# Patient Record
Sex: Female | Born: 1990 | Race: White | Hispanic: No | Marital: Married | Smoking: Former smoker
Health system: Southern US, Community
[De-identification: ages and names within clinical notes are randomized; demographics above are authoritative.]

## PROBLEM LIST (undated history)

## (undated) DIAGNOSIS — D649 Anemia, unspecified: Secondary | ICD-10-CM

## (undated) DIAGNOSIS — I82409 Acute embolism and thrombosis of unspecified deep veins of unspecified lower extremity: Secondary | ICD-10-CM

## (undated) DIAGNOSIS — K649 Unspecified hemorrhoids: Secondary | ICD-10-CM

## (undated) DIAGNOSIS — F419 Anxiety disorder, unspecified: Secondary | ICD-10-CM

## (undated) DIAGNOSIS — K602 Anal fissure, unspecified: Secondary | ICD-10-CM

## (undated) DIAGNOSIS — T7840XA Allergy, unspecified, initial encounter: Secondary | ICD-10-CM

## (undated) DIAGNOSIS — K5190 Ulcerative colitis, unspecified, without complications: Secondary | ICD-10-CM

## (undated) HISTORY — DX: Acute embolism and thrombosis of unspecified deep veins of unspecified lower extremity: I82.409

## (undated) HISTORY — DX: Allergy, unspecified, initial encounter: T78.40XA

## (undated) HISTORY — DX: Unspecified hemorrhoids: K64.9

## (undated) HISTORY — DX: Anxiety disorder, unspecified: F41.9

## (undated) HISTORY — DX: Anal fissure, unspecified: K60.2

## (undated) HISTORY — DX: Anemia, unspecified: D64.9

## (undated) HISTORY — PX: WISDOM TOOTH EXTRACTION: SHX21

## (undated) HISTORY — PX: COLONOSCOPY: SHX174

---
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
## 2010-12-18 HISTORY — PX: COSMETIC SURGERY: SHX468

## 2013-04-02 ENCOUNTER — Ambulatory Visit (INDEPENDENT_AMBULATORY_CARE_PROVIDER_SITE_OTHER): Payer: BC Managed Care – PPO | Admitting: Family Medicine

## 2013-04-02 ENCOUNTER — Emergency Department (HOSPITAL_COMMUNITY)
Admission: EM | Admit: 2013-04-02 | Discharge: 2013-04-02 | Disposition: A | Discharge status: Home / Self Care | Payer: BC Managed Care – PPO | Attending: Emergency Medicine | Admitting: Emergency Medicine

## 2013-04-02 ENCOUNTER — Encounter (HOSPITAL_COMMUNITY): Payer: Self-pay | Admitting: Emergency Medicine

## 2013-04-02 ENCOUNTER — Emergency Department (HOSPITAL_COMMUNITY): Payer: BC Managed Care – PPO

## 2013-04-02 DIAGNOSIS — F411 Generalized anxiety disorder: Secondary | ICD-10-CM | POA: Insufficient documentation

## 2013-04-02 DIAGNOSIS — Z79899 Other long term (current) drug therapy: Secondary | ICD-10-CM | POA: Insufficient documentation

## 2013-04-02 DIAGNOSIS — Z3202 Encounter for pregnancy test, result negative: Secondary | ICD-10-CM | POA: Insufficient documentation

## 2013-04-02 DIAGNOSIS — F17200 Nicotine dependence, unspecified, uncomplicated: Secondary | ICD-10-CM | POA: Insufficient documentation

## 2013-04-02 DIAGNOSIS — S3981XA Other specified injuries of abdomen, initial encounter: Secondary | ICD-10-CM | POA: Insufficient documentation

## 2013-04-02 DIAGNOSIS — R109 Unspecified abdominal pain: Secondary | ICD-10-CM

## 2013-04-02 DIAGNOSIS — S0990XA Unspecified injury of head, initial encounter: Secondary | ICD-10-CM | POA: Insufficient documentation

## 2013-04-02 DIAGNOSIS — Y9389 Activity, other specified: Secondary | ICD-10-CM | POA: Insufficient documentation

## 2013-04-02 DIAGNOSIS — Y9289 Other specified places as the place of occurrence of the external cause: Secondary | ICD-10-CM | POA: Insufficient documentation

## 2013-04-02 LAB — URINALYSIS, DIPSTICK ONLY
Bilirubin Urine: NEGATIVE
Glucose, UA: NEGATIVE mg/dL
Hgb urine dipstick: NEGATIVE
Ketones, ur: NEGATIVE mg/dL
Nitrite: NEGATIVE
Protein, ur: NEGATIVE mg/dL
Specific Gravity, Urine: 1.011 (ref 1.005–1.030)
Urobilinogen, UA: 0.2 mg/dL (ref 0.0–1.0)
pH: 7.5 (ref 5.0–8.0)

## 2013-04-02 LAB — POCT PREGNANCY, URINE: Preg Test, Ur: NEGATIVE

## 2013-04-02 MED ORDER — ONDANSETRON HCL 4 MG/2ML IJ SOLN
4.0000 mg | Freq: Once | INTRAMUSCULAR | Status: AC
  Administered 2013-04-02: 4 mg via INTRAVENOUS
  Filled 2013-04-02: qty 2

## 2013-04-02 MED ORDER — SODIUM CHLORIDE 0.9 % IV BOLUS (SEPSIS): 1000.0000 mL | Freq: Once | INTRAVENOUS | Status: DC

## 2013-04-02 MED ORDER — TRAMADOL HCL 50 MG PO TABS: 50.0000 mg | ORAL_TABLET | Freq: Four times a day (QID) | ORAL | Status: DC | PRN

## 2013-04-02 MED ORDER — KETOROLAC TROMETHAMINE 30 MG/ML IJ SOLN
30.0000 mg | Freq: Once | INTRAMUSCULAR | Status: AC
  Administered 2013-04-02: 30 mg via INTRAVENOUS
  Filled 2013-04-02: qty 1
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
## 2013-04-02 NOTE — ED Notes (Signed)
Patient transported to CT 
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
## 2013-04-02 NOTE — ED Notes (Signed)
MD at bedside. 
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
## 2015-11-24 ENCOUNTER — Ambulatory Visit (INDEPENDENT_AMBULATORY_CARE_PROVIDER_SITE_OTHER): Payer: BC Managed Care – PPO | Admitting: Family Medicine

## 2015-11-24 VITALS — BP 132/72 | HR 98 | Temp 98.4°F | Resp 16 | Ht 63.0 in | Wt 160.4 lb

## 2015-11-24 DIAGNOSIS — N898 Other specified noninflammatory disorders of vagina: Secondary | ICD-10-CM | POA: Diagnosis not present

## 2015-11-24 NOTE — Progress Notes (Signed)
Chief Complaint:  Chief Complaint  Patient presents with  . Mass    Vaginal area, 1 week, no pain, no drainage    HPI: Gloria Green is a 24 y.o. female who reports to [facility] today complaining of 1 week hx of vaginal lesion STD testing x 3  All negative at her pb gyn, she s here because she is worried. No fevers ,chills, cahnges to the area, warmth, redness, pain, no dc.  They did everything even did viral cx for  HSV 2 and was neg She has been scrubbing it hard, has stopped shaving, has used Destin and also neomycin  Past Medical History  Diagnosis Date  . Anxiety    Past Surgical History  Procedure Laterality Date  . Cosmetic surgery  2012    nose   Social History   Social History  . Marital Status: Single    Spouse Name: N/A  . Number of Children: N/A  . Years of Education: N/A   Social History Main Topics  . Smoking status: Current Every Day Smoker  . Smokeless tobacco: None  . Alcohol Use: None  . Drug Use: None  . Sexual Activity: Not Asked   Other Topics Concern  . None   Social History Narrative   Family History  Problem Relation Age of Onset  . Hypertension Mother   . Hypertension Father    Allergies  Allergen Reactions  . Sulfa Antibiotics Hives   Prior to Admission medications   Medication Sig Start Date End Date Taking? Authorizing Provider  norethindrone-ethinyl estradiol (JUNEL FE,GILDESS FE,LOESTRIN FE) 1-20 MG-MCG tablet Take 1 tablet by mouth daily. Uses continuous use   Yes Historical Provider, MD  ARIPiprazole (ABILIFY) 5 MG tablet Take 2.5 mg by mouth every morning. 1/2 tab daily    Historical Provider, MD  escitalopram (LEXAPRO) 10 MG tablet Take 10 mg by mouth every morning.     Historical Provider, MD  lamoTRIgine (LAMICTAL) 200 MG tablet Take 200 mg by mouth every morning.     Historical Provider, MD  traMADol (ULTRAM) 50 MG tablet Take 1 tablet (50 mg total) by mouth every 6 (six) hours as needed for pain. Patient not taking:  Reported on 11/24/2015 04/02/13   Nat Christen, MD     ROS: The patient denies fevers, chills, night sweats, unintentional weight loss, chest pain, palpitations, wheezing, dyspnea on exertion, nausea, vomiting, abdominal pain, dysuria, hematuria, melena, numbness, weakness, or tingling.   All other systems have been reviewed and were otherwise negative with the exception of those mentioned in the HPI and as above.    PHYSICAL EXAM: Filed Vitals:   11/24/15 1854  BP: 132/72  Pulse: 98  Temp: 98.4 F (36.9 C)  Resp: 16   Body mass index is 28.42 kg/(m^2).   General: Alert, no acute distress HEENT:  Normocephalic, atraumatic, oropharynx patent. EOMI, PERRLA Cardiovascular:  Regular rate and rhythm, no rubs murmurs or gallops.   Respiratory: Clear to auscultation bilaterally.  No wheezes, rales, or rhonchi.  No cyanosis, no use of accessory musculature Abdominal: No organomegaly, abdomen is soft and non-tender, positive bowel sounds. No masses. Skin: No rashes. Neurologic: Facial musculature symmetric. Psychiatric: Patient acts appropriately throughout our interaction. Lymphatic: No cervical or submandibular lymphadenopathy Musculoskeletal: Gait intact. No edema, tenderness Small perivaginal abraision at 12 oclock, it is very small and superficial about less than 5 mm  No dc , no e/o infection   LABS: Results for orders placed or  performed during the hospital encounter of 04/02/13  Urinalysis, dipstick only  Result Value Ref Range   Specific Gravity, Urine 1.011 1.005 - 1.030   pH 7.5 5.0 - 8.0   Glucose, UA NEGATIVE NEGATIVE mg/dL   Hgb urine dipstick NEGATIVE NEGATIVE   Bilirubin Urine NEGATIVE NEGATIVE   Ketones, ur NEGATIVE NEGATIVE mg/dL   Protein, ur NEGATIVE NEGATIVE mg/dL   Urobilinogen, UA 0.2 0.0 - 1.0 mg/dL   Nitrite NEGATIVE NEGATIVE   Leukocytes, UA SMALL (A) NEGATIVE  Pregnancy, urine POC  Result Value Ref Range   Preg Test, Ur NEGATIVE NEGATIVE      EKG/XRAY:   Primary read interpreted by Dr. Marin Comment at [facility].   ASSESSMENT/PLAN: Encounter Diagnosis  Name Primary?  . Vaginal lesion Yes   This is jsut a small abrasion Avoid caustic products or scrubbing  Use destin prn or zinc ointment as barrier cream Fu prn   Gross sideeffects, risk and benefits, and alternatives of medications d/w patient. Patient is aware that all medications have potential sideeffects and we are unable to predict every sideeffect or drug-drug interaction that may occur.  Thao Le DO  11/24/2015 8:17 PM

## 2016-01-04 ENCOUNTER — Encounter: Payer: Self-pay | Admitting: Physician Assistant

## 2016-01-04 ENCOUNTER — Ambulatory Visit (INDEPENDENT_AMBULATORY_CARE_PROVIDER_SITE_OTHER): Payer: BC Managed Care – PPO | Admitting: Physician Assistant

## 2016-01-04 VITALS — BP 138/86 | HR 92 | Temp 99.2°F | Resp 16 | Ht 62.75 in | Wt 161.0 lb

## 2016-01-04 DIAGNOSIS — J029 Acute pharyngitis, unspecified: Secondary | ICD-10-CM

## 2016-01-04 DIAGNOSIS — J0391 Acute recurrent tonsillitis, unspecified: Secondary | ICD-10-CM

## 2016-01-04 DIAGNOSIS — L03012 Cellulitis of left finger: Secondary | ICD-10-CM

## 2016-01-04 LAB — POCT RAPID STREP A (OFFICE): RAPID STREP A SCREEN: NEGATIVE

## 2016-01-04 NOTE — Progress Notes (Signed)
[facility] [address] [place] [address] [phone]  Date:  01/04/2016   Name:  Gloria Green   DOB:  [birth year]   MRN:  [id number]  PCP:  No PCP Per Patient    Chief Complaint: Hand Pain and Sore Throat   History of Present Illness:  This is a 25 y.o. female who is presenting with left 4th finger pain x 2 days. States she had a hang nail and bit it off. Afterwards she started having pain and swelling of the finger. This morning she woke and looked like there was pus near the cuticle. She states this has happened before and required lancing.  She is also complaining of sore throat and nasal congestion that began this morning. She is running a low grade fever in the 99s. She was dx'd with tonsillitis 2 months ago. Strep negative at that time. She was prescribed amoxicillin. She took only part of the rx because she started to feel better. 1 month later sore throat recurred. She took the rest of the amoxicillin and again her sx resolved. Pt states she is prone to strep. She gets strep throat 4-5 times a year. She gets tonsillitis strep negative even more times during the year. She states she has been begging her PCP to refer her to ENT to have her tonsils removed but states her PCP tells her "that's an old school method". She denies cough, otalgia.  Review of Systems:  Review of Systems See HPI  There are no active problems to display for this patient.   Prior to Admission medications   Medication Sig Start Date End Date Taking? Authorizing Provider  norethindrone-ethinyl estradiol (JUNEL FE,GILDESS FE,LOESTRIN FE) 1-20 MG-MCG tablet Take 1 tablet by mouth daily. Uses continuous use   Yes Historical Provider, MD                         Allergies  Allergen Reactions  . Sulfa Antibiotics Hives    Past Surgical History  Procedure Laterality Date  . Cosmetic surgery  2012    nose    Social History  Substance Use Topics  . Smoking status: Current  Every Day Smoker  . Smokeless tobacco: None  . Alcohol Use: None    Family History  Problem Relation Age of Onset  . Hypertension Mother   . Hypertension Father     Medication list has been reviewed and updated.  Physical Examination:  Physical Exam  Constitutional: She is oriented to person, place, and time. She appears well-developed and well-nourished. No distress.  HENT:  Head: Normocephalic and atraumatic.  Right Ear: Hearing, tympanic membrane, external ear and ear canal normal.  Left Ear: Hearing, tympanic membrane, external ear and ear canal normal.  Nose: Nose normal.  Mouth/Throat: Uvula is midline and mucous membranes are normal. Posterior oropharyngeal erythema present. No oropharyngeal exudate or posterior oropharyngeal edema.  Eyes: Conjunctivae and lids are normal. Right eye exhibits no discharge. Left eye exhibits no discharge. No scleral icterus.  Cardiovascular: Normal rate, regular rhythm, normal heart sounds and normal pulses.   No murmur heard. Pulmonary/Chest: Effort normal and breath sounds normal. No respiratory distress. She has no wheezes. She has no rhonchi. She has no rales.  Musculoskeletal: Normal range of motion.  Lymphadenopathy:       Head (right side): No submental, no submandibular and no tonsillar adenopathy present.       Head (left side): No submental,  no submandibular and no tonsillar adenopathy present.    She has cervical adenopathy (shotty, bilateral).  Neurological: She is alert and oriented to person, place, and time.  Skin: Skin is warm, dry and intact.  Left fourth digit with mild erythema and purulence along nail bed. TTP.  Psychiatric: She has a normal mood and affect. Her speech is normal and behavior is normal. Thought content normal.   BP 138/86 mmHg  Pulse 92  Temp(Src) 99.2 F (37.3 C) (Oral)  Resp 16  Ht 5' 2.75" (1.594 m)  Wt 161 lb (73.029 kg)  BMI 28.74 kg/m2  SpO2 100%  Procedure: Verbal consent obtained. Skin  was cleaned with alcohol. Right sided digital block performed with 1 cc 1% lido without epi. A 0.25 cm incision was made. A moderate amount of purulent material expressed. Wound not packed. Wound dressed and wound care discussed.  Results for orders placed or performed in visit on 01/04/16  POCT rapid strep A  Result Value Ref Range   Rapid Strep A Screen Negative Negative   Assessment and Plan:  1. Paronychia, left Paronychia drained. Wound culture pending. No surrounding cellulitis so no abx prescribed. She will soak finger once a day for next 3 days. Counseled on wound care. Return as needed. - Wound culture  2. Sore throat 3. Recurrent tonsillitis  Rapid strep negative, culture pending. Will abstain from treating with abx since she is on so often for tonsillitis. Referred to ENT for evaluation. - POCT rapid strep A - Culture, Group A Strep - Ambulatory referral to ENT   Benjaman Pott. Drenda Freeze, MHS [facility]  01/04/2016

## 2016-01-04 NOTE — Patient Instructions (Signed)
I have referred you to ENT Drink plenty of water (64 oz/day) and get plenty of rest. Ibuprofen/tylenol for throat pain. Hot tea and cough drops can help. I will call you with result of your throat culture If your symptoms are not improving in 7 days, return to clinic.   Paronychia Paronychia is an infection of the skin that surrounds a nail. It usually affects the skin around a fingernail, but it may also occur near a toenail. It often causes pain and swelling around the nail. This condition may come on suddenly or develop over a longer period. In some cases, a collection of pus (abscess) can form near or under the nail. Usually, paronychia is not serious and it clears up with treatment. CAUSES This condition may be caused by bacteria or fungi. It is commonly caused by either Streptococcus or Staphylococcus bacteria. The bacteria or fungi often cause the infection by getting into the affected area through an opening in the skin, such as a cut or a hangnail. RISK FACTORS This condition is more likely to develop in:  People who get their hands wet often, such as those who work as Designer, industrial/product, bartenders, or nurses.  People who bite their fingernails or suck their thumbs.  People who trim their nails too short.  People who have hangnails or injured fingertips.  People who get manicures.  People who have diabetes. SYMPTOMS Symptoms of this condition include:  Redness and swelling of the skin near the nail.  Tenderness around the nail when you touch the area.  Pus-filled bumps under the cuticle. The cuticle is the skin at the base or sides of the nail.  Fluid or pus under the nail.  Throbbing pain in the area. DIAGNOSIS This condition is usually diagnosed with a physical exam. In some cases, a sample of pus may be taken from an abscess to be tested in a lab. This can help to determine what type of bacteria or fungi is causing the condition. TREATMENT Treatment for this condition  depends on the cause and severity of the condition. If the condition is mild, it may clear up on its own in a few days. Your health care provider may recommend soaking the affected area in warm water a few times a day. When treatment is needed, the options may include:  Antibiotic medicine, if the condition is caused by a bacterial infection.  Antifungal medicine, if the condition is caused by a fungal infection.  Incision and drainage, if an abscess is present. In this procedure, the health care provider will cut open the abscess so the pus can drain out. HOME CARE INSTRUCTIONS  Soak the affected area in warm water if directed to do so by your health care provider. You may be told to do this for 20 minutes, 2-3 times a day. Keep the area dry in between soakings.  Take medicines only as directed by your health care provider.  If you were prescribed an antibiotic medicine, finish all of it even if you start to feel better.  Keep the affected area clean.  Do not try to drain a fluid-filled bump yourself.  If you will be washing dishes or performing other tasks that require your hands to get wet, wear rubber gloves. You should also wear gloves if your hands might come in contact with irritating substances, such as cleaners or chemicals.  Follow your health care provider's instructions about:  Wound care.  Bandage (dressing) changes and removal. SEEK MEDICAL CARE IF:  Your  symptoms get worse or do not improve with treatment.  You have a fever or chills.  You have redness spreading from the affected area.  You have continued or increased fluid, blood, or pus coming from the affected area.  Your finger or knuckle becomes swollen or is difficult to move.   This information is not intended to replace advice given to you by your health care provider. Make sure you discuss any questions you have with your health care provider.   Document Released: 05/30/2001 Document Revised: 04/20/2015  Document Reviewed: 11/11/2014 Elsevier Interactive Patient Education [employer].

## 2016-01-06 LAB — CULTURE, GROUP A STREP: Organism ID, Bacteria: NORMAL

## 2016-01-08 LAB — WOUND CULTURE: Gram Stain: NONE SEEN

## 2017-11-27 ENCOUNTER — Ambulatory Visit: Payer: BC Managed Care – PPO | Admitting: Physician Assistant

## 2019-05-27 NOTE — H&P (Signed)
NAME: Gloria Green, C[place]la. MEDICAL RECORD ZO:[id number] ACCOUNT [id number] DATE OF BIRTH:1991/12/11 FACILITY: [facility] LOCATION:  PHYSICIAN:Marjorie Deprey Garry Heater, MD  HISTORY AND PHYSICAL  DATE OF ADMISSION:  06/16/2019  CHIEF COMPLAINT:  Symptomatic leiomyoma.  HISTORY OF PRESENT ILLNESS:  A 28 year old G0 P0 who is currently on OCPs who has a known history of significant leiomyoma that has grown in size over the last several years and is causing problems with increased pain and bloating.  Ultrasound on 02/20  showed 1 large fibroid 12 x 7 x 9.  There were several that were 4.2, 4.4 and 4.5, all left lateral and towards the fundus.  Adnexa negative.  She presents now for laparotomy with myomectomy.  This procedure was discussed in detail including the specific  risks regarding bleeding, infection, adjacent organ injury, wound infection, phlebitis, the need for delivery by cesarean, the possible fact that fibroids can return.  Her expected recovery time reviewed, the possible need for transfusion discussed  likewise.  PAST MEDICAL HISTORY:  Last Pap smear on 02/19 was normal.  She is a G0 P0.  SOCIAL HISTORY:  She is married, nonsmoker.  PAST SURGICAL HISTORY:  She had a nose reconstruction done in the past.  PHYSICAL EXAMINATION: VITAL SIGNS:  Temperature 98.2, blood pressure 120/72. HEENT:  Unremarkable. NECK:  Supple, without masses. LUNGS:  Clear. CARDIOVASCULAR:  Regular rate and rhythm without murmurs, rubs or gallops. BREASTS:  Without masses. ABDOMEN:  Soft, flat, nontender.  Abdominal exam:  Uterus approximately 14-week size with fibroids palpable above her symphysis since she is fairly thin. PELVIC:  Uterus was 14-week size. NEUROLOGIC:  Unremarkable.  IMPRESSION:  Symptomatic leiomyoma.  PLAN:  Laparotomy with multiple myomectomy.  Procedure and risks reviewed as above.  LN/NUANCE  D:05/26/2019 T:05/26/2019 JOB:[id number]

## 2019-06-11 NOTE — Patient Instructions (Addendum)
YOU ARE REQUIRED TO BE TESTED FOR COVID-19 PRIOR TO YOUR SURGERY . YOUR TEST MUST BE COMPLETED ON  Thursday, June, 25. TESTING IS LOCATED AT [facility] FROM 9:00AM - 3:00PM. FAILURE TO COMPLETE TESTING MAY RESULT IN CANCELLATION OF YOUR SURGERY.  PLEASE REMEMBER TO QUARANTINE AFTER YOUR COVID TEST UNTIL YOUR DAY OF SURGERY !     Your procedure is scheduled on   Monday 06/16/2019   Report to [facility]   Call this number if you have problems the morning of surgery  :[phone].   OUR ADDRESS IS [address].  WE ARE LOCATED IN [facility].                                     REMEMBER:   DO NOT EAT FOOD OR DRINK LIQUIDS AFTER MIDNIGHT .    TAKE THESE MEDICATIONS MORNING OF SURGERY WITH A SIP OF WATER:  Loestrin FE (birth control)  IF YOU ARE SPENDING THE NIGHT AFTER SURGERY PLEASE BRING ALL YOUR PRESCRIPTION MEDICATIONS IN THEIR ORIGINAL BOTTLES.                                     DO NOT WEAR JEWERLY, MAKE UP, OR NAIL POLISH,  DO NOT WEAR LOTIONS, POWDERS, PERFUMES OR DEODORANT. DO NOT SHAVE FOR 24 HOURS PRIOR TO DAY OF SURGERY. . CONTACTS, GLASSES, OR DENTURES MAY NOT BE WORN TO SURGERY.                                    [facility] IS NOT RESPONSIBLE  FOR ANY BELONGINGS.                                                                    Marland Kitchen                                                                                                    [facility] - Preparing for Surgery Before surgery, you can play an important role.  Because skin is not sterile, your skin needs to be as free of germs as possible.  You can reduce the number of germs on your skin by washing with CHG (chlorahexidine gluconate) soap before surgery.  CHG is an antiseptic cleaner which kills germs and bonds with the skin to continue killing germs even after washing. Please DO NOT use if you have an allergy to CHG or  antibacterial soaps.  If your skin becomes reddened/irritated stop using the CHG and inform your nurse  when you arrive at Short Stay. Do not shave (including legs and underarms) for at least 48 hours prior to the first CHG shower.  You may shave your face/neck. Please follow these instructions carefully:  1.  Shower with CHG Soap the night before surgery and the  morning of Surgery.  2.  If you choose to wash your hair, wash your hair first as usual with your  normal  shampoo.  3.  After you shampoo, rinse your hair and body thoroughly to remove the  shampoo.                           4.  Use CHG as you would any other liquid soap.  You can apply chg directly  to the skin and wash                       Gently with a scrungie or clean washcloth.  5.  Apply the CHG Soap to your body ONLY FROM THE NECK DOWN.   Do not use on face/ open                           Wound or open sores. Avoid contact with eyes, ears mouth and genitals (private parts).                       Wash face,  Genitals (private parts) with your normal soap.             6.  Wash thoroughly, paying special attention to the area where your surgery  will be performed.  7.  Thoroughly rinse your body with warm water from the neck down.  8.  DO NOT shower/wash with your normal soap after using and rinsing off  the CHG Soap.                9.  Pat yourself dry with a clean towel.            10.  Wear clean pajamas.            11.  Place clean sheets on your bed the night of your first shower and do not  sleep with pets. Day of Surgery : Do not apply any lotions/deodorants the morning of surgery.  Please wear clean clothes to the hospital/surgery center.  FAILURE TO FOLLOW THESE INSTRUCTIONS MAY RESULT IN THE CANCELLATION OF YOUR SURGERY PATIENT SIGNATURE_________________________________  NURSE SIGNATURE__________________________________  ________________________________________________________________________  WHAT IS A BLOOD  TRANSFUSION? Blood Transfusion Information  A transfusion is the replacement of blood or some of its parts. Blood is made up of multiple cells which provide different functions.  Red blood cells carry oxygen and are used for blood loss replacement.  White blood cells fight against infection.  Platelets control bleeding.  Plasma helps clot blood.  Other blood products are available for specialized needs, such as hemophilia or other clotting disorders. BEFORE THE TRANSFUSION  Who gives blood for transfusions?   Healthy volunteers who are fully evaluated to make sure their blood is safe. This is blood bank blood. Transfusion therapy is the safest it has ever been in the practice of medicine. Before blood is taken from a donor, a complete history is taken to make sure that person has no history of diseases nor engages in risky social behavior (examples are intravenous drug use or sexual activity with  multiple partners). The donor's travel history is screened to minimize risk of transmitting infections, such as malaria. The donated blood is tested for signs of infectious diseases, such as HIV and hepatitis. The blood is then tested to be sure it is compatible with you in order to minimize the chance of a transfusion reaction. If you or a relative donates blood, this is often done in anticipation of surgery and is not appropriate for emergency situations. It takes many days to process the donated blood. RISKS AND COMPLICATIONS Although transfusion therapy is very safe and saves many lives, the main dangers of transfusion include:   Getting an infectious disease.  Developing a transfusion reaction. This is an allergic reaction to something in the blood you were given. Every precaution is taken to prevent this. The decision to have a blood transfusion has been considered carefully by your caregiver before blood is given. Blood is not given unless the benefits outweigh the risks. AFTER THE  TRANSFUSION  Right after receiving a blood transfusion, you will usually feel much better and more energetic. This is especially true if your red blood cells have gotten low (anemic). The transfusion raises the level of the red blood cells which carry oxygen, and this usually causes an energy increase.  The nurse administering the transfusion will monitor you carefully for complications. HOME CARE INSTRUCTIONS  No special instructions are needed after a transfusion. You may find your energy is better. Speak with your caregiver about any limitations on activity for underlying diseases you may have. SEEK MEDICAL CARE IF:   Your condition is not improving after your transfusion.  You develop redness or irritation at the intravenous (IV) site. SEEK IMMEDIATE MEDICAL CARE IF:  Any of the following symptoms occur over the next 12 hours:  Shaking chills.  You have a temperature by mouth above 102 F (38.9 C), not controlled by medicine.  Chest, back, or muscle pain.  People around you feel you are not acting correctly or are confused.  Shortness of breath or difficulty breathing.  Dizziness and fainting.  You get a rash or develop hives.  You have a decrease in urine output.  Your urine turns a dark color or changes to pink, red, or brown. Any of the following symptoms occur over the next 10 days:  You have a temperature by mouth above 102 F (38.9 C), not controlled by medicine.  Shortness of breath.  Weakness after normal activity.  The white part of the eye turns yellow (jaundice).  You have a decrease in the amount of urine or are urinating less often.  Your urine turns a dark color or changes to pink, red, or brown. Document Released: 12/01/2000 Document Revised: 02/26/2012 Document Reviewed: 07/20/2008 [facility] Patient Information 2014 [place], [place].  _______________________________________________________________________

## 2019-06-12 ENCOUNTER — Other Ambulatory Visit: Payer: Self-pay

## 2019-06-12 ENCOUNTER — Encounter (HOSPITAL_COMMUNITY): Payer: Self-pay

## 2019-06-12 ENCOUNTER — Other Ambulatory Visit (HOSPITAL_COMMUNITY)
Admission: RE | Admit: 2019-06-12 | Discharge: 2019-06-12 | Disposition: A | Discharge status: Home / Self Care | Payer: BC Managed Care – PPO | Source: Ambulatory Visit | Attending: Obstetrics and Gynecology | Admitting: Obstetrics and Gynecology

## 2019-06-12 ENCOUNTER — Encounter (HOSPITAL_COMMUNITY)
Admission: RE | Admit: 2019-06-12 | Discharge: 2019-06-12 | Disposition: A | Discharge status: Home / Self Care | Payer: BC Managed Care – PPO | Source: Ambulatory Visit | Attending: Obstetrics and Gynecology | Admitting: Obstetrics and Gynecology

## 2019-06-12 DIAGNOSIS — Z01812 Encounter for preprocedural laboratory examination: Secondary | ICD-10-CM | POA: Diagnosis not present

## 2019-06-12 DIAGNOSIS — D214 Benign neoplasm of connective and other soft tissue of abdomen: Secondary | ICD-10-CM | POA: Diagnosis not present

## 2019-06-12 DIAGNOSIS — Z1159 Encounter for screening for other viral diseases: Secondary | ICD-10-CM | POA: Insufficient documentation

## 2019-06-12 LAB — CBC
HCT: 43 % (ref 36.0–46.0)
Hemoglobin: 13.9 g/dL (ref 12.0–15.0)
MCH: 31.1 pg (ref 26.0–34.0)
MCHC: 32.3 g/dL (ref 30.0–36.0)
MCV: 96.2 fL (ref 80.0–100.0)
Platelets: 242 10*3/uL (ref 150–400)
RBC: 4.47 MIL/uL (ref 3.87–5.11)
RDW: 11.8 % (ref 11.5–15.5)
WBC: 8.3 10*3/uL (ref 4.0–10.5)
nRBC: 0 % (ref 0.0–0.2)

## 2019-06-12 LAB — SARS CORONAVIRUS 2 (TAT 6-24 HRS): SARS Coronavirus 2: NEGATIVE

## 2019-06-13 LAB — ABO/RH: ABO/RH(D): A POS

## 2019-06-16 ENCOUNTER — Encounter (HOSPITAL_BASED_OUTPATIENT_CLINIC_OR_DEPARTMENT_OTHER): Payer: Self-pay | Admitting: Certified Registered"

## 2019-06-16 ENCOUNTER — Encounter (HOSPITAL_BASED_OUTPATIENT_CLINIC_OR_DEPARTMENT_OTHER)
Admission: RE | Disposition: A | Discharge status: Home / Self Care | Payer: Self-pay | Source: Home / Self Care | Attending: Obstetrics and Gynecology

## 2019-06-16 ENCOUNTER — Inpatient Hospital Stay (HOSPITAL_BASED_OUTPATIENT_CLINIC_OR_DEPARTMENT_OTHER): Payer: BC Managed Care – PPO | Admitting: Anesthesiology

## 2019-06-16 ENCOUNTER — Observation Stay (HOSPITAL_BASED_OUTPATIENT_CLINIC_OR_DEPARTMENT_OTHER)
Admission: RE | Admit: 2019-06-16 | Discharge: 2019-06-17 | Disposition: A | Discharge status: Home / Self Care | Payer: BC Managed Care – PPO | Attending: Obstetrics and Gynecology | Admitting: Obstetrics and Gynecology

## 2019-06-16 ENCOUNTER — Inpatient Hospital Stay (HOSPITAL_BASED_OUTPATIENT_CLINIC_OR_DEPARTMENT_OTHER): Payer: BC Managed Care – PPO | Admitting: Physician Assistant

## 2019-06-16 DIAGNOSIS — D259 Leiomyoma of uterus, unspecified: Principal | ICD-10-CM | POA: Insufficient documentation

## 2019-06-16 DIAGNOSIS — D219 Benign neoplasm of connective and other soft tissue, unspecified: Secondary | ICD-10-CM | POA: Diagnosis present

## 2019-06-16 DIAGNOSIS — Z87891 Personal history of nicotine dependence: Secondary | ICD-10-CM | POA: Insufficient documentation

## 2019-06-16 HISTORY — PX: MYOMECTOMY: SHX85

## 2019-06-16 LAB — TYPE AND SCREEN
ABO/RH(D): A POS
Antibody Screen: NEGATIVE

## 2019-06-16 LAB — POCT PREGNANCY, URINE: Preg Test, Ur: NEGATIVE

## 2019-06-16 SURGERY — MYOMECTOMY, ABDOMINAL APPROACH
Anesthesia: General | Site: Abdomen

## 2019-06-16 MED ORDER — ONDANSETRON HCL 4 MG/2ML IJ SOLN
INTRAMUSCULAR | Status: AC
  Filled 2019-06-16: qty 2

## 2019-06-16 MED ORDER — FENTANYL CITRATE (PF) 100 MCG/2ML IJ SOLN
INTRAMUSCULAR | Status: AC
  Filled 2019-06-16: qty 2

## 2019-06-16 MED ORDER — BUTORPHANOL TARTRATE 1 MG/ML IJ SOLN
1.0000 mg | INTRAMUSCULAR | Status: DC | PRN
  Filled 2019-06-16: qty 2

## 2019-06-16 MED ORDER — PROPOFOL 10 MG/ML IV BOLUS
INTRAVENOUS | Status: AC
  Filled 2019-06-16: qty 20

## 2019-06-16 MED ORDER — KETOROLAC TROMETHAMINE 30 MG/ML IJ SOLN
30.0000 mg | Freq: Once | INTRAMUSCULAR | Status: DC
  Filled 2019-06-16: qty 1

## 2019-06-16 MED ORDER — LIDOCAINE 2% (20 MG/ML) 5 ML SYRINGE
INTRAMUSCULAR | Status: AC
  Filled 2019-06-16: qty 5

## 2019-06-16 MED ORDER — ACETAMINOPHEN 160 MG/5ML PO SOLN
325.0000 mg | ORAL | Status: DC | PRN
  Filled 2019-06-16: qty 20.3

## 2019-06-16 MED ORDER — OXYCODONE HCL 5 MG PO TABS
5.0000 mg | ORAL_TABLET | Freq: Once | ORAL | Status: DC | PRN
  Filled 2019-06-16: qty 1

## 2019-06-16 MED ORDER — GABAPENTIN 100 MG PO CAPS
200.0000 mg | ORAL_CAPSULE | Freq: Two times a day (BID) | ORAL | Status: DC
  Administered 2019-06-16 (×2): 200 mg via ORAL
  Filled 2019-06-16 (×3): qty 2

## 2019-06-16 MED ORDER — ONDANSETRON HCL 4 MG/2ML IJ SOLN
INTRAMUSCULAR | Status: DC | PRN
  Administered 2019-06-16: 4 mg via INTRAVENOUS

## 2019-06-16 MED ORDER — SODIUM CHLORIDE 0.9 % IV SOLN
INTRAVENOUS | Status: AC
  Filled 2019-06-16: qty 2

## 2019-06-16 MED ORDER — SODIUM CHLORIDE 0.9% FLUSH
9.0000 mL | INTRAVENOUS | Status: DC | PRN
  Filled 2019-06-16: qty 10

## 2019-06-16 MED ORDER — SODIUM CHLORIDE (PF) 0.9 % IJ SOLN
INTRAMUSCULAR | Status: DC | PRN
  Administered 2019-06-16: 50 mL

## 2019-06-16 MED ORDER — SUGAMMADEX SODIUM 200 MG/2ML IV SOLN
INTRAVENOUS | Status: DC | PRN
  Administered 2019-06-16: 200 mg via INTRAVENOUS

## 2019-06-16 MED ORDER — PROPOFOL 10 MG/ML IV BOLUS
INTRAVENOUS | Status: DC | PRN
  Administered 2019-06-16: 70 mg via INTRAVENOUS
  Administered 2019-06-16: 130 mg via INTRAVENOUS

## 2019-06-16 MED ORDER — OXYCODONE HCL 5 MG/5ML PO SOLN
5.0000 mg | Freq: Once | ORAL | Status: DC | PRN
  Filled 2019-06-16: qty 5

## 2019-06-16 MED ORDER — SODIUM CHLORIDE 0.9 % IV SOLN
2.0000 g | INTRAVENOUS | Status: AC
  Administered 2019-06-16: 2 g via INTRAVENOUS
  Filled 2019-06-16: qty 2

## 2019-06-16 MED ORDER — NALOXONE HCL 0.4 MG/ML IJ SOLN
0.4000 mg | INTRAMUSCULAR | Status: DC | PRN
  Filled 2019-06-16: qty 1

## 2019-06-16 MED ORDER — HYDROCODONE-ACETAMINOPHEN 5-325 MG PO TABS
ORAL_TABLET | ORAL | Status: AC
  Filled 2019-06-16: qty 1

## 2019-06-16 MED ORDER — MIDAZOLAM HCL 5 MG/5ML IJ SOLN
INTRAMUSCULAR | Status: DC | PRN
  Administered 2019-06-16: 2 mg via INTRAVENOUS

## 2019-06-16 MED ORDER — BUPIVACAINE HCL 0.25 % IJ SOLN
INTRAMUSCULAR | Status: DC | PRN
  Administered 2019-06-16: 30 mL

## 2019-06-16 MED ORDER — DEXAMETHASONE SODIUM PHOSPHATE 10 MG/ML IJ SOLN
INTRAMUSCULAR | Status: DC | PRN
  Administered 2019-06-16: 5 mg via INTRAVENOUS

## 2019-06-16 MED ORDER — VASOPRESSIN 20 UNIT/ML IV SOLN: INTRAVENOUS | Status: DC | PRN

## 2019-06-16 MED ORDER — SUGAMMADEX SODIUM 200 MG/2ML IV SOLN
INTRAVENOUS | Status: AC
  Filled 2019-06-16: qty 2

## 2019-06-16 MED ORDER — ROCURONIUM BROMIDE 10 MG/ML (PF) SYRINGE
PREFILLED_SYRINGE | INTRAVENOUS | Status: AC
  Filled 2019-06-16: qty 10

## 2019-06-16 MED ORDER — ONDANSETRON HCL 4 MG PO TABS
4.0000 mg | ORAL_TABLET | Freq: Four times a day (QID) | ORAL | Status: DC | PRN
  Filled 2019-06-16: qty 1

## 2019-06-16 MED ORDER — MEPERIDINE HCL 25 MG/ML IJ SOLN
6.2500 mg | INTRAMUSCULAR | Status: DC | PRN
  Filled 2019-06-16: qty 1

## 2019-06-16 MED ORDER — ONDANSETRON HCL 4 MG/2ML IJ SOLN
4.0000 mg | Freq: Four times a day (QID) | INTRAMUSCULAR | Status: DC | PRN
  Filled 2019-06-16: qty 2

## 2019-06-16 MED ORDER — LACTATED RINGERS IV SOLN
INTRAVENOUS | Status: DC
  Administered 2019-06-16: 09:00:00 via INTRAVENOUS
  Filled 2019-06-16 (×2): qty 1000

## 2019-06-16 MED ORDER — ACETAMINOPHEN 325 MG PO TABS
325.0000 mg | ORAL_TABLET | ORAL | Status: DC | PRN
  Filled 2019-06-16: qty 2

## 2019-06-16 MED ORDER — ONDANSETRON HCL 4 MG/2ML IJ SOLN
4.0000 mg | Freq: Four times a day (QID) | INTRAMUSCULAR | Status: DC | PRN
  Administered 2019-06-16: 4 mg via INTRAVENOUS
  Filled 2019-06-16: qty 2

## 2019-06-16 MED ORDER — DIPHENHYDRAMINE HCL 50 MG/ML IJ SOLN
12.5000 mg | Freq: Four times a day (QID) | INTRAMUSCULAR | Status: DC | PRN
  Filled 2019-06-16: qty 0.25

## 2019-06-16 MED ORDER — FENTANYL CITRATE (PF) 250 MCG/5ML IJ SOLN
INTRAMUSCULAR | Status: DC | PRN
  Administered 2019-06-16: 100 ug via INTRAVENOUS
  Administered 2019-06-16: 50 ug via INTRAVENOUS
  Administered 2019-06-16: 100 ug via INTRAVENOUS
  Administered 2019-06-16: 50 ug via INTRAVENOUS

## 2019-06-16 MED ORDER — KETOROLAC TROMETHAMINE 30 MG/ML IJ SOLN
INTRAMUSCULAR | Status: DC | PRN
  Administered 2019-06-16: 30 mg via INTRAVENOUS

## 2019-06-16 MED ORDER — DIPHENHYDRAMINE HCL 12.5 MG/5ML PO ELIX
12.5000 mg | ORAL_SOLUTION | Freq: Four times a day (QID) | ORAL | Status: DC | PRN
  Filled 2019-06-16: qty 5

## 2019-06-16 MED ORDER — HYDROCODONE-ACETAMINOPHEN 5-325 MG PO TABS
ORAL_TABLET | ORAL | Status: AC
  Filled 2019-06-16: qty 2

## 2019-06-16 MED ORDER — ONDANSETRON HCL 4 MG/2ML IJ SOLN
4.0000 mg | Freq: Once | INTRAMUSCULAR | Status: DC | PRN
  Filled 2019-06-16: qty 2

## 2019-06-16 MED ORDER — MIDAZOLAM HCL 2 MG/2ML IJ SOLN
INTRAMUSCULAR | Status: AC
  Filled 2019-06-16: qty 2

## 2019-06-16 MED ORDER — BUPIVACAINE LIPOSOME 1.3 % IJ SUSP
INTRAMUSCULAR | Status: DC | PRN
  Administered 2019-06-16: 20 mL

## 2019-06-16 MED ORDER — FENTANYL CITRATE (PF) 100 MCG/2ML IJ SOLN
25.0000 ug | INTRAMUSCULAR | Status: DC | PRN
  Administered 2019-06-16 (×3): 50 ug via INTRAVENOUS
  Filled 2019-06-16: qty 1

## 2019-06-16 MED ORDER — DEXAMETHASONE SODIUM PHOSPHATE 10 MG/ML IJ SOLN
INTRAMUSCULAR | Status: AC
  Filled 2019-06-16: qty 1

## 2019-06-16 MED ORDER — MENTHOL 3 MG MT LOZG
1.0000 | LOZENGE | OROMUCOSAL | Status: DC | PRN
  Filled 2019-06-16: qty 9

## 2019-06-16 MED ORDER — ZOLPIDEM TARTRATE 5 MG PO TABS
5.0000 mg | ORAL_TABLET | Freq: Every evening | ORAL | Status: DC | PRN
  Filled 2019-06-16: qty 1

## 2019-06-16 MED ORDER — LIDOCAINE 2% (20 MG/ML) 5 ML SYRINGE
INTRAMUSCULAR | Status: DC | PRN
  Administered 2019-06-16: 50 mg via INTRAVENOUS

## 2019-06-16 MED ORDER — ROCURONIUM BROMIDE 10 MG/ML (PF) SYRINGE
PREFILLED_SYRINGE | INTRAVENOUS | Status: DC | PRN
  Administered 2019-06-16: 40 mg via INTRAVENOUS

## 2019-06-16 MED ORDER — HYDROCODONE-ACETAMINOPHEN 5-325 MG PO TABS
1.0000 | ORAL_TABLET | ORAL | Status: DC | PRN
  Administered 2019-06-16 (×2): 2 via ORAL
  Administered 2019-06-16: 1 via ORAL
  Administered 2019-06-16 – 2019-06-17 (×2): 2 via ORAL
  Filled 2019-06-16: qty 2

## 2019-06-16 MED ORDER — DEXTROSE IN LACTATED RINGERS 5 % IV SOLN
INTRAVENOUS | Status: DC
  Administered 2019-06-16: 12:00:00 via INTRAVENOUS
  Filled 2019-06-16 (×2): qty 1000

## 2019-06-16 MED ORDER — KETOROLAC TROMETHAMINE 30 MG/ML IJ SOLN
INTRAMUSCULAR | Status: AC
  Filled 2019-06-16: qty 1

## 2019-06-16 SURGICAL SUPPLY — 47 items
BARRIER ADHS 3X4 INTERCEED (GAUZE/BANDAGES/DRESSINGS) IMPLANT
BENZOIN TINCTURE PRP APPL 2/3 (GAUZE/BANDAGES/DRESSINGS) ×2 IMPLANT
BLADE EXTENDED COATED 6.5IN (ELECTRODE) IMPLANT
BLADE HEX COATED 2.75 (ELECTRODE) ×2 IMPLANT
CANISTER SUCT 3000ML PPV (MISCELLANEOUS) ×2 IMPLANT
COVER WAND RF STERILE (DRAPES) ×2 IMPLANT
DECANTER SPIKE VIAL GLASS SM (MISCELLANEOUS) IMPLANT
DERMABOND ADVANCED (GAUZE/BANDAGES/DRESSINGS) ×1
DERMABOND ADVANCED .7 DNX12 (GAUZE/BANDAGES/DRESSINGS) ×1 IMPLANT
DRAPE WARM FLUID 44X44 (DRAPES) ×2 IMPLANT
DRSG OPSITE 11X17.75 LRG (GAUZE/BANDAGES/DRESSINGS) IMPLANT
DRSG OPSITE 6X11 MED (GAUZE/BANDAGES/DRESSINGS) ×2 IMPLANT
DRSG OPSITE POSTOP 4X10 (GAUZE/BANDAGES/DRESSINGS) IMPLANT
DURAPREP 26ML APPLICATOR (WOUND CARE) ×2 IMPLANT
GLOVE BIO SURGEON STRL SZ7 (GLOVE) ×4 IMPLANT
GOWN STRL REUS W/ TWL XL LVL3 (GOWN DISPOSABLE) ×3 IMPLANT
GOWN STRL REUS W/TWL XL LVL3 (GOWN DISPOSABLE) ×3
HOLDER FOLEY CATH W/STRAP (MISCELLANEOUS) ×2 IMPLANT
KIT TURNOVER CYSTO (KITS) ×2 IMPLANT
MANIPULATOR UTERINE 4.5 ZUMI (MISCELLANEOUS) IMPLANT
NEEDLE HYPO 18GX1.5 BLUNT FILL (NEEDLE) IMPLANT
NEEDLE HYPO 22GX1.5 SAFETY (NEEDLE) ×2 IMPLANT
NEEDLE SPNL 22GX3.5 QUINCKE BK (NEEDLE) ×2 IMPLANT
NS IRRIG 500ML POUR BTL (IV SOLUTION) ×4 IMPLANT
PACK ABDOMINAL GYN (CUSTOM PROCEDURE TRAY) ×2 IMPLANT
PAD OB MATERNITY 4.3X12.25 (PERSONAL CARE ITEMS) ×2 IMPLANT
SPONGE LAP 4X18 RFD (DISPOSABLE) ×2 IMPLANT
STAPLER VISISTAT 35W (STAPLE) IMPLANT
STRIP CLOSURE SKIN 1/2X4 (GAUZE/BANDAGES/DRESSINGS) ×2 IMPLANT
STRIP CLOSURE SKIN 1/4X4 (GAUZE/BANDAGES/DRESSINGS) IMPLANT
SUT MNCRL AB 4-0 PS2 18 (SUTURE) IMPLANT
SUT PDS AB 0 CT1 27 (SUTURE) IMPLANT
SUT VIC AB 0 CT1 18XCR BRD8 (SUTURE) IMPLANT
SUT VIC AB 0 CT1 27 (SUTURE)
SUT VIC AB 0 CT1 27XBRD ANBCTR (SUTURE) IMPLANT
SUT VIC AB 0 CT1 8-18 (SUTURE)
SUT VIC AB 2-0 CT1 (SUTURE) IMPLANT
SUT VIC AB 2-0 CT1 27 (SUTURE)
SUT VIC AB 2-0 CT1 TAPERPNT 27 (SUTURE) IMPLANT
SUT VIC AB 3-0 CT1 27 (SUTURE)
SUT VIC AB 3-0 CT1 TAPERPNT 27 (SUTURE) IMPLANT
SUT VICRYL 0 TIES 12 18 (SUTURE) ×2 IMPLANT
SYR 10ML LL (SYRINGE) ×4 IMPLANT
SYR CONTROL 10ML LL (SYRINGE) ×2 IMPLANT
SYRINGE 60CC LL (MISCELLANEOUS) IMPLANT
TOWEL OR 17X26 10 PK STRL BLUE (TOWEL DISPOSABLE) ×4 IMPLANT
WATER STERILE IRR 500ML POUR (IV SOLUTION) ×2 IMPLANT

## 2019-06-16 NOTE — Op Note (Signed)
Preoperative diagnosis: Leiomyoma Postoperative diagnosis: Same Procedure, laparotomy with myomectomy Surgeon: Matthew Saras Assistant: [place] EBL: 75 cc Procedure findings:  Patient taken to the operating room after an adequate level of general anesthesia was obtained the abdomen prepped and draped and Foley catheter was position.  Appropriate timeouts were taken at that point.  Transverse incision made 2 fingerbreadths above the symphysis carried down to the fascia which was incised and extended transversely.  Rectus muscles divided in the midline, peritoneum entered superiorly without incident and extended in a vertical fashion.  Retractor was not used.  Upon elevating a very large fibroid, the remainder of the uterus bilateral tubes and ovaries, anterior and posterior cul-de-sac all look free and clear.  The fibroid mass was approximately 12 x 14cm all pedunculated just anterior to the isthmus of the tube and medial to the round ligament on the left side.  2 large Kelly clamps were then placed across the base, the large fibroid was excised, 0 Vicryl interrupted sutures followed by 3-0 Vicryl to close the serosa was used for hemostasis.  The pelvis was irrigated appendix inspected and noted to be normal.   At the remainder the operative site was hemostatic, the pelvic anatomy otherwise looked normal there were no other fibroids again fimbriated and adnexal anatomy otherwise looked normal.  Prior to closure sponge, needle, instrument counts reported as correct x2 peritoneum closed with running 3-0 Vicryl suture the same on the rectus muscles in the midline to reapproximate.  Double-armed 0 PDS was then used to close the fascia.  Exparel solution diluted with quarter percent Marcaine and saline was then used for subfascial and subcutaneous injection for postoperative analgesia.  The subcutaneous tissue was hemostatic was not closed separately.  4-0 Monocryl subcuticular skin closure with benzoin and  Steri-Strips applied clear urine noted at the end the case.  She tolerated this well went to recovery room in good condition.  Dictated with Dragon Medical 1  Margarette Asal MD

## 2019-06-16 NOTE — Progress Notes (Signed)
The patient was re-examined with no change in status 

## 2019-06-16 NOTE — Anesthesia Preprocedure Evaluation (Signed)
Anesthesia Evaluation  Patient identified by MRN, date of birth, ID band Patient awake    Reviewed: Allergy & Precautions, H&P , NPO status , Patient's Chart, lab work & pertinent test results, reviewed documented beta blocker date and time   Airway Mallampati: II  TM Distance: >3 FB Neck ROM: full    Dental no notable dental hx.    Pulmonary former smoker,    Pulmonary exam normal breath sounds clear to auscultation       Cardiovascular Exercise Tolerance: Good negative cardio ROS   Rhythm:regular Rate:Normal     Neuro/Psych PSYCHIATRIC DISORDERS Anxiety negative neurological ROS     GI/Hepatic negative GI ROS, Neg liver ROS,   Endo/Other  negative endocrine ROS  Renal/GU negative Renal ROS  negative genitourinary   Musculoskeletal   Abdominal   Peds  Hematology negative hematology ROS (+)   Anesthesia Other Findings   Reproductive/Obstetrics negative OB ROS                             Anesthesia Physical Anesthesia Plan  ASA: II  Anesthesia Plan: General   Post-op Pain Management:    Induction: Intravenous  PONV Risk Score and Plan: 3 and Ondansetron, Treatment may vary due to age or medical condition, Dexamethasone and Midazolam  Airway Management Planned: Oral ETT and LMA  Additional Equipment:   Intra-op Plan:   Post-operative Plan:   Informed Consent: I have reviewed the patients History and Physical, chart, labs and discussed the procedure including the risks, benefits and alternatives for the proposed anesthesia with the patient or authorized representative who has indicated his/her understanding and acceptance.     Dental Advisory Given  Plan Discussed with: CRNA, Anesthesiologist and Surgeon  Anesthesia Plan Comments: ( )        Anesthesia Quick Evaluation

## 2019-06-16 NOTE — Transfer of Care (Signed)
Immediate Anesthesia Transfer of Care Note  Patient: Gloria Green  Procedure(s) Performed: ABDOMINAL MYOMECTOMY (N/A Abdomen)  Patient Location: PACU  Anesthesia Type:General  Level of Consciousness: awake, alert , oriented and patient cooperative  Airway & Oxygen Therapy: Patient Spontanous Breathing and Patient connected to nasal cannula oxygen  Post-op Assessment: Report given to RN, Post -op Vital signs reviewed and stable and Patient moving all extremities  Post vital signs: Reviewed and stable  Last Vitals:  Vitals Value Taken Time  BP    Temp    Pulse 95 06/16/19 1032  Resp 18 06/16/19 1032  SpO2 100 % 06/16/19 1032  Vitals shown include unvalidated device data.  Last Pain:  Vitals:   06/16/19 0748  TempSrc: Oral  PainSc: 0-No pain      Patients Stated Pain Goal: 7 (81/85/63 1497)  Complications: No apparent anesthesia complications

## 2019-06-16 NOTE — Anesthesia Postprocedure Evaluation (Signed)
Anesthesia Post Note  Patient: Gloria Green  Procedure(s) Performed: ABDOMINAL MYOMECTOMY (N/A Abdomen)     Patient location during evaluation: PACU Anesthesia Type: General Level of consciousness: awake and alert Pain management: pain level controlled Vital Signs Assessment: post-procedure vital signs reviewed and stable Respiratory status: spontaneous breathing, nonlabored ventilation, respiratory function stable and patient connected to nasal cannula oxygen Cardiovascular status: blood pressure returned to baseline and stable Postop Assessment: no apparent nausea or vomiting Anesthetic complications: no    Last Vitals:  Vitals:   06/16/19 1604 06/16/19 1945  BP: 119/63 (!) 108/57  Pulse: 80   Resp: 18 16  Temp: 36.6 C 37 C  SpO2: 100% 97%    Last Pain:  Vitals:   06/16/19 1945  TempSrc:   PainSc: 6                  Sukhman Martine

## 2019-06-16 NOTE — Anesthesia Procedure Notes (Addendum)
Procedure Name: Intubation Date/Time: 06/16/2019 9:35 AM Performed by: Myna Bright, CRNA Pre-anesthesia Checklist: Patient identified, Emergency Drugs available, Suction available and Patient being monitored Patient Re-evaluated:Patient Re-evaluated prior to induction Oxygen Delivery Method: Circle system utilized Preoxygenation: Pre-oxygenation with 100% oxygen Induction Type: IV induction Ventilation: Mask ventilation without difficulty Laryngoscope Size: Mac and 3 Grade View: Grade I Tube type: Oral Tube size: 7.0 mm Number of attempts: 1 Airway Equipment and Method: Stylet Placement Confirmation: ETT inserted through vocal cords under direct vision,  positive ETCO2 and breath sounds checked- equal and bilateral Secured at: 21 cm Tube secured with: Tape Dental Injury: Teeth and Oropharynx as per pre-operative assessment

## 2019-06-17 ENCOUNTER — Encounter (HOSPITAL_BASED_OUTPATIENT_CLINIC_OR_DEPARTMENT_OTHER): Payer: Self-pay | Admitting: Obstetrics and Gynecology

## 2019-06-17 DIAGNOSIS — D259 Leiomyoma of uterus, unspecified: Secondary | ICD-10-CM | POA: Diagnosis not present

## 2019-06-17 LAB — CBC
HCT: 36.3 % (ref 36.0–46.0)
Hemoglobin: 11.8 g/dL — ABNORMAL LOW (ref 12.0–15.0)
MCH: 31.6 pg (ref 26.0–34.0)
MCHC: 32.5 g/dL (ref 30.0–36.0)
MCV: 97.1 fL (ref 80.0–100.0)
Platelets: 177 10*3/uL (ref 150–400)
RBC: 3.74 MIL/uL — ABNORMAL LOW (ref 3.87–5.11)
RDW: 11.9 % (ref 11.5–15.5)
WBC: 11.4 10*3/uL — ABNORMAL HIGH (ref 4.0–10.5)
nRBC: 0 % (ref 0.0–0.2)

## 2019-06-17 MED ORDER — HYDROCODONE-ACETAMINOPHEN 10-325 MG PO TABS
ORAL_TABLET | ORAL | Status: AC
  Filled 2019-06-17: qty 1

## 2019-06-17 MED ORDER — IBUPROFEN 200 MG PO TABS: 800.0000 mg | ORAL_TABLET | Freq: Three times a day (TID) | ORAL | 1 refills | Status: DC | PRN

## 2019-06-17 MED ORDER — HYDROCODONE-ACETAMINOPHEN 5-325 MG PO TABS: 1.0000 | ORAL_TABLET | ORAL | 0 refills | Status: DC | PRN

## 2019-06-17 MED ORDER — HYDROCODONE-ACETAMINOPHEN 10-325 MG PO TABS
1.0000 | ORAL_TABLET | ORAL | Status: AC
  Administered 2019-06-17: 1 via ORAL
  Filled 2019-06-17: qty 1

## 2019-06-17 MED ORDER — ACETAMINOPHEN 325 MG PO TABS
325.0000 mg | ORAL_TABLET | ORAL | Status: AC
  Administered 2019-06-17: 325 mg via ORAL
  Filled 2019-06-17: qty 1

## 2019-06-17 MED ORDER — ACETAMINOPHEN 325 MG PO TABS
ORAL_TABLET | ORAL | Status: AC
  Filled 2019-06-17: qty 1

## 2019-06-17 MED ORDER — HYDROCODONE-ACETAMINOPHEN 5-325 MG PO TABS
ORAL_TABLET | ORAL | Status: AC
  Filled 2019-06-17: qty 2

## 2019-06-17 NOTE — Discharge Summary (Signed)
Physician Discharge Summary  Patient ID: Gloria Green MRN: [id number] DOB/AGE: [birth year] 28 y.o.  Admit date: 06/16/2019 Discharge date: 06/17/2019  Admission [facility]  Discharge Diagnoses: same Active Problems:   Leiomyoma   Discharged Condition: good  Hospital Course: adm for EL w/ myomectomy, on POD 1, was afeb, tol PO, Inc C/D and ambulating  Consults: None  Significant Diagnostic Studies: labs:  Results for orders placed or performed during the hospital encounter of 06/16/19 (from the past 24 hour(s))  Pregnancy, urine POC     Status: None   Collection Time: 06/16/19  8:45 AM  Result Value Ref Range   Preg Test, Ur NEGATIVE NEGATIVE  CBC     Status: Abnormal   Collection Time: 06/17/19  5:09 AM  Result Value Ref Range   WBC 11.4 (H) 4.0 - 10.5 K/uL   RBC 3.74 (L) 3.87 - 5.11 MIL/uL   Hemoglobin 11.8 (L) 12.0 - 15.0 g/dL   HCT 36.3 36.0 - 46.0 %   MCV 97.1 80.0 - 100.0 fL   MCH 31.6 26.0 - 34.0 pg   MCHC 32.5 30.0 - 36.0 g/dL   RDW 11.9 11.5 - 15.5 %   Platelets 177 150 - 400 K/uL   nRBC 0.0 0.0 - 0.2 %    Treatments: surgery: myomectomy  Discharge Exam: Blood pressure 119/69, pulse 78, temperature 98.1 F (36.7 C), resp. rate 18, height 5' 2"  (1.575 m), weight 56.3 kg, SpO2 100 %. abd soft + BS, dressing dry  Disposition: Discharge disposition: 01-Home or Self Care        Allergies as of 06/17/2019      Reactions   Sulfa Antibiotics Hives   Latex Rash      Medication List    TAKE these medications   HYDROcodone-acetaminophen 5-325 MG tablet Commonly known as: NORCO/VICODIN Take 1-2 tablets by mouth every 4 (four) hours as needed for moderate pain.   ibuprofen 200 MG tablet Commonly known as: Advil Take 4 tablets (800 mg total) by mouth every 8 (eight) hours as needed for moderate pain.   norethindrone-ethinyl estradiol 1-20 MG-MCG tablet Commonly known as: LOESTRIN FE Take 1 tablet by mouth daily. Uses continuous use      Follow-up Information    Molli Posey, MD. Schedule an appointment as soon as possible for a visit in 1 week(s).   Specialty: Obstetrics and Gynecology Contact information: [address] [place] [address] [phone]           Signed: Margarette Asal 06/17/2019, 7:44 AM

## 2021-08-01 ENCOUNTER — Telehealth: Payer: Self-pay

## 2021-08-01 NOTE — Telephone Encounter (Signed)
Patient's mother called and asked to have her daughter established with Dr Silverio Decamp. Dr Silverio Decamp has agreed to take her as a new patient if the patient agrees.  Called the patient to discuss. No answer. Left her a message to call me back. Will need her to sign a release of medical records if she is wanting to establish.

## 2021-08-01 NOTE — Telephone Encounter (Signed)
Called the patient and left a message. I am holding an appointment on 08/04/21 at 1:50 pm for her. She will need to be here at 1:35 and no later than 1:40 pm. Asked her to call back to confirm or decline.

## 2021-08-01 NOTE — Telephone Encounter (Addendum)
Offered 08/04/21 at 1:50pm and 08/12/21 at 2:30 pm. Patient is able to come to the 08/12/21 appointment.

## 2021-08-02 NOTE — Telephone Encounter (Signed)
Records received from [facility]. Will send the records to [facility].

## 2021-08-04 NOTE — Telephone Encounter (Signed)
Ok, thanks.

## 2021-08-12 ENCOUNTER — Encounter: Payer: Self-pay | Admitting: Gastroenterology

## 2021-08-12 ENCOUNTER — Other Ambulatory Visit (INDEPENDENT_AMBULATORY_CARE_PROVIDER_SITE_OTHER): Payer: BC Managed Care – PPO

## 2021-08-12 ENCOUNTER — Ambulatory Visit (INDEPENDENT_AMBULATORY_CARE_PROVIDER_SITE_OTHER): Payer: BC Managed Care – PPO | Admitting: Gastroenterology

## 2021-08-12 VITALS — BP 110/50 | HR 112 | Ht 62.5 in | Wt 149.5 lb

## 2021-08-12 DIAGNOSIS — R197 Diarrhea, unspecified: Secondary | ICD-10-CM

## 2021-08-12 DIAGNOSIS — K625 Hemorrhage of anus and rectum: Secondary | ICD-10-CM

## 2021-08-12 DIAGNOSIS — K648 Other hemorrhoids: Secondary | ICD-10-CM

## 2021-08-12 LAB — SEDIMENTATION RATE: Sed Rate: 28 mm/hr — ABNORMAL HIGH (ref 0–20)

## 2021-08-12 LAB — COMPREHENSIVE METABOLIC PANEL
ALT: 12 U/L (ref 0–35)
AST: 12 U/L (ref 0–37)
Albumin: 3.3 g/dL — ABNORMAL LOW (ref 3.5–5.2)
Alkaline Phosphatase: 40 U/L (ref 39–117)
BUN: 15 mg/dL (ref 6–23)
CO2: 23 mEq/L (ref 19–32)
Calcium: 9.2 mg/dL (ref 8.4–10.5)
Chloride: 104 mEq/L (ref 96–112)
Creatinine, Ser: 0.52 mg/dL (ref 0.40–1.20)
GFR: 123.57 mL/min (ref >60.00)
Glucose, Bld: 113 mg/dL — ABNORMAL HIGH (ref 70–99)
Potassium: 3.8 mEq/L (ref 3.5–5.1)
Sodium: 136 mEq/L (ref 135–145)
Total Bilirubin: 0.6 mg/dL (ref 0.2–1.2)
Total Protein: 6.4 g/dL (ref 6.0–8.3)

## 2021-08-12 LAB — CBC WITH DIFFERENTIAL/PLATELET
Basophils Absolute: 0 10*3/uL (ref 0.0–0.1)
Basophils Relative: 0.3 % (ref 0.0–3.0)
Eosinophils Absolute: 0 10*3/uL (ref 0.0–0.7)
Eosinophils Relative: 0.1 % (ref 0.0–5.0)
HCT: 36.3 % (ref 36.0–46.0)
Hemoglobin: 12.3 g/dL (ref 12.0–15.0)
Lymphocytes Relative: 20.3 % (ref 12.0–46.0)
Lymphs Abs: 2.3 10*3/uL (ref 0.7–4.0)
MCHC: 34 g/dL (ref 30.0–36.0)
MCV: 98.3 fl (ref 78.0–100.0)
Monocytes Absolute: 0.4 10*3/uL (ref 0.1–1.0)
Monocytes Relative: 3.9 % (ref 3.0–12.0)
Neutro Abs: 8.6 10*3/uL — ABNORMAL HIGH (ref 1.4–7.7)
Neutrophils Relative %: 75.4 % (ref 43.0–77.0)
Platelets: 188 10*3/uL (ref 150.0–400.0)
RBC: 3.69 Mil/uL — ABNORMAL LOW (ref 3.87–5.11)
RDW: 14.5 % (ref 11.5–15.5)
WBC: 11.4 10*3/uL — ABNORMAL HIGH (ref 4.0–10.5)

## 2021-08-12 LAB — HIGH SENSITIVITY CRP: CRP, High Sensitivity: 5.25 mg/L — ABNORMAL HIGH (ref 0.000–5.000)

## 2021-08-12 MED ORDER — HYDROCORTISONE ACETATE 25 MG RE SUPP: 25.0000 mg | Freq: Two times a day (BID) | RECTAL | 3 refills | Status: DC

## 2021-08-12 NOTE — Patient Instructions (Signed)
Your provider has requested that you go to the basement level for lab work before leaving today. Press "B" on the elevator. The lab is located at the first door on the left as you exit the elevator.   We have sent Anusol suppositories to your pharmacy  Continue Prednisone 10 mg daily for 2 weeks then decrease to 5 mg daily for 2 weeks then STOP  Due to recent changes in healthcare laws, you may see the results of your imaging and laboratory studies on MyChart before your provider has had a chance to review them.  We understand that in some cases there may be results that are confusing or concerning to you. Not all laboratory results come back in the same time frame and the provider may be waiting for multiple results in order to interpret others.  Please give [place] 48 hours in order for your provider to thoroughly review all the results before contacting the office for clarification of your results.    If you are age 81 or older, your body mass index should be between 23-30. Your Body mass index is 26.91 kg/m. If this is out of the aforementioned range listed, please consider follow up with your Primary Care Provider.  If you are age 107 or younger, your body mass index should be between 19-25. Your Body mass index is 26.91 kg/m. If this is out of the aformentioned range listed, please consider follow up with your Primary Care Provider.   __________________________________________________________  The [facility] providers would like to encourage you to use [facility] to communicate with providers for non-urgent requests or questions.  Due to long hold times on the telephone, sending your provider a message by [facility] may be a faster and more efficient way to get a response.  Please allow 48 business hours for a response.  Please remember that this is for non-urgent requests.    I appreciate the  opportunity to care for you  Thank You   Harl Bowie , MD

## 2021-08-12 NOTE — Progress Notes (Signed)
Gloria Green    [id number]    February 10, 1991  Primary Care Physician:Mitchell, L.Marlou Sa, MD  Referring Physician: Alroy Dust, Carlean Jews.Marlou Sa, [address] [employer] [place],  [place] [address]   Chief complaint:  diarrhea, rectal bleeding  HPI:  30 year old very pleasant female, [redacted] weeks pregnant here for new patient visit for evaluation of chronic diarrhea and rectal bleeding She has been having intermittent episodes of diarrhea, she had a brief episode after her first pregnancy which self resolved and then she developed it again in October last year.  Since May 2022 she had severe diarrhea with 10-20 bowel movements per day associated with bleeding.   Fecal calprotectin mildly elevated at 121 in Jan 2022  CRP mildly elevated 15 (Normal range 0-10)  CRP was elevated to 73 in May 2022  She is followed by high risk OB/GYN.  She was started on prednisone 40 mg daily and was tapered slowly and is currently on 10 mg daily She is no longer having diarrhea or rectal bleeding.  She is having 1-2 formed bowel movements daily    Outpatient Encounter Medications as of 08/12/2021  Medication Sig   ferrous sulfate 325 (65 FE) MG tablet Take 1 tablet by mouth daily.   Pramox-PE-Glycerin-Petrolatum (HEMORRHOIDAL EX) Apply 1 application topically as needed.   predniSONE (DELTASONE) 10 MG tablet Take 1 tablet by mouth daily.   Prenatal Vit-Fe Fumarate-FA (PRENATAL VITAMIN AND MINERAL) 28-0.8 MG TABS Take 1 tablet by mouth daily.   ondansetron (ZOFRAN) 4 MG tablet Take by mouth as needed. (Patient not taking: Reported on 08/12/2021)   [DISCONTINUED] HYDROcodone-acetaminophen (NORCO/VICODIN) 5-325 MG tablet Take 1-2 tablets by mouth every 4 (four) hours as needed for moderate pain.   [DISCONTINUED] ibuprofen (ADVIL) 200 MG tablet Take 4 tablets (800 mg total) by mouth every 8 (eight) hours as needed for moderate pain.   [DISCONTINUED] norethindrone-ethinyl estradiol (JUNEL FE,GILDESS  FE,LOESTRIN FE) 1-20 MG-MCG tablet Take 1 tablet by mouth daily. Uses continuous use   No facility-administered encounter medications on file as of 08/12/2021.    Allergies as of 08/12/2021 - Review Complete 08/12/2021  Allergen Reaction Noted   Sulfa antibiotics Hives 04/02/2013   Latex Rash 06/09/2019    Past Medical History:  Diagnosis Date   Anal fissure    Anemia    Anxiety    Hemorrhoid     Past Surgical History:  Procedure Laterality Date   COSMETIC SURGERY  12/18/2010   nose   MYOMECTOMY N/A 06/16/2019   Procedure: ABDOMINAL MYOMECTOMY;  Surgeon: Molli Posey, MD;  Location: [facility];  Service: Gynecology;  Laterality: N/A;   WISDOM TOOTH EXTRACTION      Family History  Problem Relation Age of Onset   Hypertension Mother    Obesity Father    Hypertension Brother    Lung cancer Maternal Grandmother    Lung cancer Maternal Grandfather     Social History   Socioeconomic History   Marital status: Married    Spouse name: Not on file   Number of children: 1   Years of education: Not on file   Highest education level: Not on file  Occupational History   Occupation: Teacher  Tobacco Use   Smoking status: Former    Types: Cigarettes    Quit date: 06/11/2017    Years since quitting: 4.1   Smokeless tobacco: Never  Vaping Use   Vaping Use: Former   Quit date:  10/11/2018  Substance and Sexual Activity   Alcohol use: Not Currently    Comment: occ   Drug use: Never   Sexual activity: Not on file  Other Topics Concern   Not on file  Social History Narrative   Not on file   Social Determinants of Health   Financial Resource Strain: Not on file  Food Insecurity: Not on file  Transportation Needs: Not on file  Physical Activity: Not on file  Stress: Not on file  Social Connections: Not on file  Intimate Partner Violence: Not on file      Review of systems: All other review of systems negative except as mentioned in the  HPI.   Physical Exam: Vitals:   08/12/21 1427  BP: (!) 110/50  Pulse: (!) 112   Body mass index is 26.91 kg/m. Gen:      No acute distress HEENT:  sclera anicteric Abd:      soft, non-tender; no palpable masses, no distension Ext:    No edema Neuro: alert and oriented x 3 Psych: normal mood and affect Rectal exam: Normal anal sphincter tone, no anal fissure or external hemorrhoids Anoscopy: Medium size grade 2 internal hemorrhoids, no active bleeding, visible mucosa appeared normal, normal dentate line, no visible nodules  Data Reviewed:  Reviewed labs, radiology imaging, old records and pertinent past GI work up   Assessment and Plan/Recommendations:  30 year old very pleasant female, [redacted] weeks pregnant with significant unexplained diarrhea and rectal bleeding She had mild elevation in fecal calprotectin and also CRP  Currently her symptoms are under control on prednisone taper  We will plan to continue slow taper of prednisone, advised patient to continue 10 mg daily for 2 weeks and then taper to 5 mg daily for additional 2 weeks before stopping prednisone  Will need to exclude underlying inflammatory bowel disease We will plan for colonoscopy after she delivers the baby once it is safe for her to undergo the procedure  Check CBC, CMP, CRP, sed rate for baseline labs Continue to follow-up closely with high risk OB and GYN  Return in 3 months or sooner if needed  The patient was provided an opportunity to ask questions and all were answered. The patient agreed with the plan and demonstrated an understanding of the instructions.  Damaris Hippo , MD    CC: Alroy Dust, L.Marlou Sa, MD

## 2021-08-16 ENCOUNTER — Encounter: Payer: Self-pay | Admitting: Gastroenterology

## 2021-08-25 ENCOUNTER — Telehealth: Payer: Self-pay | Admitting: Gastroenterology

## 2021-08-25 MED ORDER — HYDROCORTISONE ACETATE 25 MG RE SUPP: 25.0000 mg | Freq: Two times a day (BID) | RECTAL | 3 refills | Status: DC

## 2021-08-25 NOTE — Telephone Encounter (Signed)
Anusol sent to pharmacy as requested

## 2021-09-30 ENCOUNTER — Telehealth: Payer: Self-pay | Admitting: Gastroenterology

## 2021-09-30 NOTE — Telephone Encounter (Signed)
Hi Dr. Silverio Decamp,   Patient called wanting to proceed with scheduling a colonoscopy. I see the 08/12/21 OV your recommendations to schedule a colonoscopy after she delivers the baby and once it is safe to undergo the procedure. She has now delivered and she had a C section not sure if now would be safe for her to schedule.   Please advise, also she has an upcoming appointment on 10/17/21 to follow up with you not sure if she can schedule both.   Thanks

## 2021-10-01 NOTE — Telephone Encounter (Signed)
We can schedule procedure after office visit, I will discuss in detail during the visit and also can assess if its appropriate to proceed with colonoscopy at this point. Thanks

## 2021-10-06 ENCOUNTER — Telehealth: Payer: Self-pay | Admitting: Gastroenterology

## 2021-10-06 ENCOUNTER — Other Ambulatory Visit: Payer: Self-pay

## 2021-10-06 ENCOUNTER — Other Ambulatory Visit: Payer: BC Managed Care – PPO

## 2021-10-06 DIAGNOSIS — R197 Diarrhea, unspecified: Secondary | ICD-10-CM

## 2021-10-06 MED ORDER — ORTIKOS 9 MG PO CP24: 9.0000 mg | ORAL_CAPSULE | Freq: Every day | ORAL | 3 refills | Status: DC

## 2021-10-06 NOTE — Telephone Encounter (Signed)
Called the patient. No answer. Left her a brief explanation of the plan. Asked for a return call. Lab order is entered. Rx to the [facility]. Did this now in anticipation of a PA being required.

## 2021-10-06 NOTE — Telephone Encounter (Signed)
Patient had C-section on 09/11/21. She has been off of prednisone for "about 2 months." 4-5 days ago she developed loose stools. Today she has diarrhea with blood. She reports 7 stools this morning. Her follow up appointment is 10/17/21. Please advise.

## 2021-10-06 NOTE — Telephone Encounter (Signed)
Please check stool C.diff.  Will plan to start Ortikos 54m daily if negative for acute GI infection. Please send Rx for 30 days. Very small amount is expressed in breast milk so will cause minimal side effects to infant if she is breast feeding. Please advise her to consult pediatrician to be sure. Thanks

## 2021-10-06 NOTE — Telephone Encounter (Signed)
Inbound call from patient stating diarrhea has gotten worse and now has blood in her stool.  Wants to know if she can be seen sooner or if there is something she can take until she comes in for ger appt.  Please advise.

## 2021-10-07 ENCOUNTER — Other Ambulatory Visit: Payer: BC Managed Care – PPO

## 2021-10-07 DIAGNOSIS — R197 Diarrhea, unspecified: Secondary | ICD-10-CM

## 2021-10-08 LAB — CLOSTRIDIUM DIFFICILE BY PCR: Toxigenic C. Difficile by PCR: NEGATIVE

## 2021-10-12 ENCOUNTER — Other Ambulatory Visit: Payer: Self-pay | Admitting: *Deleted

## 2021-10-12 ENCOUNTER — Telehealth: Payer: Self-pay

## 2021-10-12 MED ORDER — BUDESONIDE 3 MG PO CPEP: 9.0000 mg | ORAL_CAPSULE | Freq: Every day | ORAL | 0 refills | Status: DC

## 2021-10-12 NOTE — Telephone Encounter (Signed)
Please check if Budesonide is approved. If there is significant delay in getting budesonide, we will need to start Prednisone 20 mg daily X 2 weeks and decrease by 5 mg daily.

## 2021-10-12 NOTE — Telephone Encounter (Signed)
Called the [employer]. Co-pay for budesonide is $5.  Called the patient and explained the plan of care. She is not breast feeding. Medication will not be an issue. Keep the appointment on 10/17/21.

## 2021-10-12 NOTE — Telephone Encounter (Signed)
I called the patient to give her the results of her stool study. She tells me she is passing bloody stools again. She hopes she does not have to start prednisone. Tells me she is very tired. Her office visit is 10/17/21. She said she does not have the budesonide. I think a PA was being handled by Shirlean Mylar, but do not know where she is with that.  I am out of the office 10/27 and 10/28.

## 2021-10-12 NOTE — Telephone Encounter (Signed)
-----   Message from Mauri Pole, MD sent at 10/11/2021 12:02 PM EDT ----- C.diff negative Please inform patient the results. Thanks

## 2021-10-12 NOTE — Telephone Encounter (Signed)
Beth I sent in today a new script for generic Budesonide 19m  three once a day  Suppose to be covered by her insurance I sent to her Local pharmacy

## 2021-10-16 ENCOUNTER — Encounter (HOSPITAL_BASED_OUTPATIENT_CLINIC_OR_DEPARTMENT_OTHER): Payer: Self-pay | Admitting: Radiology

## 2021-10-16 ENCOUNTER — Other Ambulatory Visit: Payer: Self-pay

## 2021-10-16 ENCOUNTER — Emergency Department (HOSPITAL_BASED_OUTPATIENT_CLINIC_OR_DEPARTMENT_OTHER): Payer: BC Managed Care – PPO

## 2021-10-16 ENCOUNTER — Emergency Department (HOSPITAL_BASED_OUTPATIENT_CLINIC_OR_DEPARTMENT_OTHER)
Admission: EM | Admit: 2021-10-16 | Discharge: 2021-10-16 | Disposition: A | Discharge status: Home / Self Care | Payer: BC Managed Care – PPO | Attending: Emergency Medicine | Admitting: Emergency Medicine

## 2021-10-16 ENCOUNTER — Telehealth: Payer: Self-pay | Admitting: Physician Assistant

## 2021-10-16 DIAGNOSIS — Z91040 Latex allergy status: Secondary | ICD-10-CM | POA: Insufficient documentation

## 2021-10-16 DIAGNOSIS — E860 Dehydration: Secondary | ICD-10-CM | POA: Diagnosis not present

## 2021-10-16 DIAGNOSIS — N200 Calculus of kidney: Secondary | ICD-10-CM | POA: Diagnosis not present

## 2021-10-16 DIAGNOSIS — Z87891 Personal history of nicotine dependence: Secondary | ICD-10-CM | POA: Diagnosis not present

## 2021-10-16 DIAGNOSIS — R197 Diarrhea, unspecified: Secondary | ICD-10-CM

## 2021-10-16 DIAGNOSIS — Z20822 Contact with and (suspected) exposure to covid-19: Secondary | ICD-10-CM | POA: Diagnosis not present

## 2021-10-16 DIAGNOSIS — K529 Noninfective gastroenteritis and colitis, unspecified: Secondary | ICD-10-CM | POA: Diagnosis not present

## 2021-10-16 DIAGNOSIS — R1031 Right lower quadrant pain: Secondary | ICD-10-CM | POA: Diagnosis present

## 2021-10-16 DIAGNOSIS — E876 Hypokalemia: Secondary | ICD-10-CM | POA: Diagnosis not present

## 2021-10-16 DIAGNOSIS — D3500 Benign neoplasm of unspecified adrenal gland: Secondary | ICD-10-CM | POA: Diagnosis not present

## 2021-10-16 DIAGNOSIS — R000 Tachycardia, unspecified: Secondary | ICD-10-CM | POA: Insufficient documentation

## 2021-10-16 LAB — CBC WITH DIFFERENTIAL/PLATELET
Abs Immature Granulocytes: 0.07 10*3/uL (ref 0.00–0.07)
Basophils Absolute: 0 10*3/uL (ref 0.0–0.1)
Basophils Relative: 0 %
Eosinophils Absolute: 0.1 10*3/uL (ref 0.0–0.5)
Eosinophils Relative: 1 %
HCT: 36.4 % (ref 36.0–46.0)
Hemoglobin: 12.2 g/dL (ref 12.0–15.0)
Immature Granulocytes: 1 %
Lymphocytes Relative: 17 %
Lymphs Abs: 1.8 10*3/uL (ref 0.7–4.0)
MCH: 30.8 pg (ref 26.0–34.0)
MCHC: 33.5 g/dL (ref 30.0–36.0)
MCV: 91.9 fL (ref 80.0–100.0)
Monocytes Absolute: 1.2 10*3/uL — ABNORMAL HIGH (ref 0.1–1.0)
Monocytes Relative: 12 %
Neutro Abs: 7.2 10*3/uL (ref 1.7–7.7)
Neutrophils Relative %: 69 %
Platelets: 223 10*3/uL (ref 150–400)
RBC: 3.96 MIL/uL (ref 3.87–5.11)
RDW: 11.4 % — ABNORMAL LOW (ref 11.5–15.5)
WBC: 10.4 10*3/uL (ref 4.0–10.5)
nRBC: 0 % (ref 0.0–0.2)

## 2021-10-16 LAB — URINALYSIS, ROUTINE W REFLEX MICROSCOPIC
Bilirubin Urine: NEGATIVE
Glucose, UA: NEGATIVE mg/dL
Hgb urine dipstick: NEGATIVE
Ketones, ur: >80 mg/dL — AB
Leukocytes,Ua: NEGATIVE
Nitrite: NEGATIVE
Protein, ur: 100 mg/dL — AB
Specific Gravity, Urine: 1.03 (ref 1.005–1.030)
pH: 6.5 (ref 5.0–8.0)

## 2021-10-16 LAB — COMPREHENSIVE METABOLIC PANEL
ALT: 8 U/L (ref 0–44)
AST: 11 U/L — ABNORMAL LOW (ref 15–41)
Albumin: 3 g/dL — ABNORMAL LOW (ref 3.5–5.0)
Alkaline Phosphatase: 46 U/L (ref 38–126)
Anion gap: 11 (ref 5–15)
BUN: 9 mg/dL (ref 6–20)
CO2: 22 mmol/L (ref 22–32)
Calcium: 8 mg/dL — ABNORMAL LOW (ref 8.9–10.3)
Chloride: 104 mmol/L (ref 98–111)
Creatinine, Ser: 0.69 mg/dL (ref 0.44–1.00)
GFR, Estimated: >60 mL/min (ref >60)
Glucose, Bld: 102 mg/dL — ABNORMAL HIGH (ref 70–99)
Potassium: 3.1 mmol/L — ABNORMAL LOW (ref 3.5–5.1)
Sodium: 137 mmol/L (ref 135–145)
Total Bilirubin: 0.6 mg/dL (ref 0.3–1.2)
Total Protein: 6.2 g/dL — ABNORMAL LOW (ref 6.5–8.1)

## 2021-10-16 LAB — RESP PANEL BY RT-PCR (FLU A&B, COVID) ARPGX2
Influenza A by PCR: NEGATIVE
Influenza B by PCR: NEGATIVE
SARS Coronavirus 2 by RT PCR: NEGATIVE

## 2021-10-16 LAB — HCG, QUANTITATIVE, PREGNANCY: hCG, Beta Chain, Quant, S: 1 m[IU]/mL (ref <5)

## 2021-10-16 LAB — LIPASE, BLOOD: Lipase: 33 U/L (ref 11–51)

## 2021-10-16 MED ORDER — SUCRALFATE 1 G PO TABS: 1.0000 g | ORAL_TABLET | Freq: Three times a day (TID) | ORAL | 0 refills | Status: DC

## 2021-10-16 MED ORDER — SODIUM CHLORIDE 0.9 % IV BOLUS
1000.0000 mL | Freq: Once | INTRAVENOUS | Status: AC
  Administered 2021-10-16: 1000 mL via INTRAVENOUS

## 2021-10-16 MED ORDER — FAMOTIDINE IN NACL 20-0.9 MG/50ML-% IV SOLN
20.0000 mg | Freq: Once | INTRAVENOUS | Status: AC
  Administered 2021-10-16: 20 mg via INTRAVENOUS
  Filled 2021-10-16: qty 50

## 2021-10-16 MED ORDER — IOHEXOL 300 MG/ML  SOLN
100.0000 mL | Freq: Once | INTRAMUSCULAR | Status: AC | PRN
  Administered 2021-10-16: 100 mL via INTRAVENOUS

## 2021-10-16 MED ORDER — ACETAMINOPHEN 325 MG PO TABS
650.0000 mg | ORAL_TABLET | Freq: Once | ORAL | Status: AC
  Administered 2021-10-16: 650 mg via ORAL
  Filled 2021-10-16: qty 2

## 2021-10-16 MED ORDER — POTASSIUM CHLORIDE CRYS ER 20 MEQ PO TBCR
40.0000 meq | EXTENDED_RELEASE_TABLET | Freq: Once | ORAL | Status: AC
  Administered 2021-10-16: 40 meq via ORAL
  Filled 2021-10-16: qty 2

## 2021-10-16 MED ORDER — DEXTROSE 5 % AND 0.9 % NACL IV BOLUS
1000.0000 mL | Freq: Once | INTRAVENOUS | Status: AC
  Administered 2021-10-16: 1000 mL via INTRAVENOUS

## 2021-10-16 MED ORDER — POTASSIUM CHLORIDE 10 MEQ/100ML IV SOLN
10.0000 meq | Freq: Once | INTRAVENOUS | Status: AC
  Administered 2021-10-16: 10 meq via INTRAVENOUS
  Filled 2021-10-16: qty 100

## 2021-10-16 MED ORDER — DICYCLOMINE HCL 20 MG PO TABS: 20.0000 mg | ORAL_TABLET | Freq: Two times a day (BID) | ORAL | 0 refills | Status: DC | PRN

## 2021-10-16 NOTE — ED Provider Notes (Signed)
[facility] DEPT Provider Note   CSN: [id number] Arrival date & time: 10/16/21  1537     History Chief Complaint  Patient presents with   Rectal Bleeding   Nausea    Gloria Green is a 30 y.o. female.  This is a 30 y.o. female with significant medical history as below, including recurrent diarrhea, anal fissure, hemorrhoid who presents to the ED with complaint of diarrhea, blood in stool. Pain ongoing >1 week. She has appointment tomorrow with PCP. She was started on budesonide, c-diff negative 10/26. She has app't with GI tomorrow. Last 2 weeks reports ongoing diarrhea, nausea without emesis, fevers, chills, arthralgias, reduced oral intake. Last PO was this morning x4 crackers, last night ate dinner and had >6-7 bowel movements with streaks of blood. She has abd cramping to b/l lower quadrants, diffusely. She was started on budesonide 3 days ago and the diarrhea has improved slightly. Still ongoing. Similar to prior episodes.   A positive blood type per chart review  Location:  b/l lower quads Duration:  2 wks Onset:  gradual Timing:  constant Description:  cramping, aching Severity:  mild Exacerbating/Alleviating Factors:  worse with oral intake Associated Symptoms:  nausea, diarrhea, arthralgais Pertinent Negatives:  no chills, no STI concern, no urine changes, no abnormal vaginal bleeding   The history is provided by the patient. No language interpreter was used.  Rectal Bleeding Associated symptoms: abdominal pain   Associated symptoms: no fever and no vomiting       Past Medical History:  Diagnosis Date   Anal fissure    Anemia    Anxiety    Hemorrhoid     Patient Active Problem List   Diagnosis Date Noted   Leiomyoma 06/16/2019    Past Surgical History:  Procedure Laterality Date   CESAREAN SECTION  09/11/2021   COSMETIC SURGERY  12/18/2010   nose   MYOMECTOMY N/A 06/16/2019   Procedure: ABDOMINAL MYOMECTOMY;  Surgeon:  Molli Posey, MD;  Location: [facility];  Service: Gynecology;  Laterality: N/A;   WISDOM TOOTH EXTRACTION       OB History     Gravida  1   Para      Term      Preterm      AB      Living         SAB      IAB      Ectopic      Multiple      Live Births              Family History  Problem Relation Age of Onset   Hypertension Mother    Obesity Father    Hypertension Brother    Lung cancer Maternal Grandmother    Lung cancer Maternal Grandfather     Social History   Tobacco Use   Smoking status: Former    Types: Cigarettes    Quit date: 06/11/2017    Years since quitting: 4.3   Smokeless tobacco: Never  Vaping Use   Vaping Use: Former   Quit date: 10/11/2018  Substance Use Topics   Alcohol use: Not Currently    Comment: occ   Drug use: Never    Home Medications Prior to Admission medications   Medication Sig Start Date End Date Taking? Authorizing Provider  dicyclomine (BENTYL) 20 MG tablet Take 1 tablet (20 mg total) by mouth 2 (two) times daily as needed for up to 5 days for  spasms. 10/16/21 10/21/21 Yes Jeanell Sparrow, DO  acetaminophen (TYLENOL) 650 MG CR tablet Take 650 mg by mouth every 8 (eight) hours as needed for pain.    [provider]  budesonide (ENTOCORT EC) 3 MG 24 hr capsule Take 3 capsules (9 mg total) by mouth daily. 10/12/21   Mauri Pole, MD  ciprofloxacin (CIPRO) 500 MG tablet Take 1 tablet (500 mg total) by mouth 2 (two) times daily. 10/17/21   Mauri Pole, MD  ibuprofen (ADVIL) 800 MG tablet Take by mouth. 09/13/21   [provider]  metroNIDAZOLE (FLAGYL) 500 MG tablet Take 1 tablet (500 mg total) by mouth 3 (three) times daily. 10/17/21   Mauri Pole, MD  ondansetron (ZOFRAN) 4 MG tablet Take by mouth as needed. 06/22/21   [provider]  ondansetron (ZOFRAN) 4 MG tablet Take 1 tablet (4 mg total) by mouth daily as needed for nausea or vomiting. 10/17/21    Mauri Pole, MD  predniSONE (DELTASONE) 10 MG tablet Take 30 mg twice a day for 1 week then decrease by 10 mg every week until 20 mg daily 10/17/21   Mauri Pole, MD  promethazine (PHENERGAN) 12.5 MG tablet Take 1 tablet (12.5 mg total) by mouth daily as needed for nausea or vomiting. 10/17/21   Mauri Pole, MD  sucralfate (CARAFATE) 1 g tablet Take 1 tablet (1 g total) by mouth 4 (four) times daily -  with meals and at bedtime for 5 days. 10/16/21 10/21/21  Wynona Dove A, DO    Allergies    Sulfa antibiotics, Latex, and Wound dressing adhesive  Review of Systems   Review of Systems  Constitutional:  Positive for fatigue. Negative for chills and fever.  HENT:  Negative for facial swelling and trouble swallowing.   Eyes:  Negative for photophobia and visual disturbance.  Respiratory:  Negative for cough and shortness of breath.   Cardiovascular:  Negative for chest pain and palpitations.  Gastrointestinal:  Positive for abdominal pain, blood in stool, diarrhea, hematochezia and nausea. Negative for vomiting.  Endocrine: Negative for polydipsia and polyuria.  Genitourinary:  Negative for difficulty urinating and hematuria.  Musculoskeletal:  Positive for arthralgias. Negative for gait problem and joint swelling.  Skin:  Negative for pallor and rash.  Neurological:  Negative for syncope and headaches.  Psychiatric/Behavioral:  Negative for agitation and confusion.    Physical Exam Updated Vital Signs BP (!) 112/55   Pulse 97   Temp 98.5 F (36.9 C) (Oral)   Resp 19   Ht 5' 2"  (1.575 m)   Wt 58.1 kg   SpO2 96%   BMI 23.41 kg/m   Physical Exam Vitals and nursing note reviewed. Exam conducted with a chaperone present.  Constitutional:      General: She is not in acute distress.    Appearance: Normal appearance. She is not ill-appearing.  HENT:     Head: Normocephalic and atraumatic.     Right Ear: External ear normal.     Left Ear: External ear normal.      Nose: Nose normal.     Mouth/Throat:     Mouth: Mucous membranes are moist.  Eyes:     General: No scleral icterus.       Right eye: No discharge.        Left eye: No discharge.  Cardiovascular:     Rate and Rhythm: Regular rhythm. Tachycardia present.     Pulses: Normal pulses.  Heart sounds: Normal heart sounds.  Pulmonary:     Effort: Pulmonary effort is normal. No respiratory distress.     Breath sounds: Normal breath sounds.  Abdominal:     General: Abdomen is flat.     Tenderness: There is abdominal tenderness.     Comments: Mild ttp to b/l lower quadrants, not peritoneal.   Genitourinary:    Comments: External rectal exam, no frank bleeding. No fissure or external hemorrhoid.   Musculoskeletal:        General: Normal range of motion.     Cervical back: Normal range of motion.     Right lower leg: No edema.     Left lower leg: No edema.  Skin:    General: Skin is warm and dry.     Capillary Refill: Capillary refill takes less than 2 seconds.  Neurological:     Mental Status: She is alert.  Psychiatric:        Mood and Affect: Mood normal.        Behavior: Behavior normal.    ED Results / Procedures / Treatments   Labs (all labs ordered are listed, but only abnormal results are displayed) Labs Reviewed  CBC WITH DIFFERENTIAL/PLATELET - Abnormal; Notable for the following components:      Result Value   RDW 11.4 (*)    Monocytes Absolute 1.2 (*)    All other components within normal limits  COMPREHENSIVE METABOLIC PANEL - Abnormal; Notable for the following components:   Potassium 3.1 (*)    Glucose, Bld 102 (*)    Calcium 8.0 (*)    Total Protein 6.2 (*)    Albumin 3.0 (*)    AST 11 (*)    All other components within normal limits  URINALYSIS, ROUTINE W REFLEX MICROSCOPIC - Abnormal; Notable for the following components:   Ketones, ur >80 (*)    Protein, ur 100 (*)    All other components within normal limits  RESP PANEL BY RT-PCR (FLU A&B, COVID)  ARPGX2  LIPASE, BLOOD  HCG, QUANTITATIVE, PREGNANCY    EKG None  Radiology CT ABDOMEN PELVIS W CONTRAST  Result Date: 10/16/2021 CLINICAL DATA:  30 year old female with acute abdominal and pelvic pain. EXAM: CT ABDOMEN AND PELVIS WITH CONTRAST TECHNIQUE: Multidetector CT imaging of the abdomen and pelvis was performed using the standard protocol following bolus administration of intravenous contrast. CONTRAST:  127m OMNIPAQUE IOHEXOL 300 MG/ML  SOLN COMPARISON:  None. FINDINGS: Lower chest: No acute abnormality. Hepatobiliary: The liver and gallbladder are unremarkable. No biliary dilatation. Pancreas: Unremarkable Spleen: Unremarkable Adrenals/Urinary Tract: A 1.5 x 2 cm RIGHT adrenal mass exhibits 40% washout from contrast to delayed images, compatible with an adenoma. A nonobstructing 3 mm RIGHT LOWER pole renal calculus is noted. The LEFT kidney, LEFT adrenal gland and bladder are unremarkable. Stomach/Bowel: Diffuse circumferential wall thickening of the transverse, descending and sigmoid colon and rectum noted with mild adjacent inflammation compatible with colitis. There is no evidence of bowel obstruction, focal collection or pneumoperitoneum. No other bowel abnormalities are identified. The appendix is normal. Vascular/Lymphatic: No significant vascular findings are present. The mesenteric vasculature is unremarkable. No enlarged abdominal or pelvic lymph nodes. Reproductive: Uterus and bilateral adnexa are unremarkable. Other: No ascites. Musculoskeletal: No acute or suspicious bony abnormalities are noted. The SI joints are unremarkable. IMPRESSION: 1. Colitis involving the transverse, descending and sigmoid colon and rectum. No evidence of bowel obstruction, focal collection or pneumoperitoneum. 2. 3 mm nonobstructing RIGHT LOWER pole renal calculus. 3.  1.5 x 2 cm RIGHT adrenal adenoma. Electronically Signed   By: Margarette Canada M.D.   On: 10/16/2021 11:27    Procedures Procedures    Medications Ordered in ED Medications  sodium chloride 0.9 % bolus 1,000 mL (0 mLs Intravenous Stopped 10/16/21 1146)  acetaminophen (TYLENOL) tablet 650 mg (650 mg Oral Given 10/16/21 0808)  famotidine (PEPCID) IVPB 20 mg premix (0 mg Intravenous Stopped 10/16/21 0843)  potassium chloride 10 mEq in 100 mL IVPB (0 mEq Intravenous Stopped 10/16/21 1042)  potassium chloride SA (KLOR-CON) CR tablet 40 mEq (40 mEq Oral Given 10/16/21 0855)  dextrose 5 % and 0.9% NaCl 5-0.9 % bolus 1,000 mL (0 mLs Intravenous Stopped 10/16/21 1200)  iohexol (OMNIPAQUE) 300 MG/ML solution 100 mL (100 mLs Intravenous Contrast Given 10/16/21 1029)    ED Course  I have reviewed the triage vital signs and the nursing notes.  Pertinent labs & imaging results that were available during my care of the patient were reviewed by me and considered in my medical decision making (see chart for details).    MDM Rules/Calculators/A&P                           CC: diarrhea  This patient complains of diarrhea; this involves an extensive number of treatment options and is a complaint that carries with it a high risk of complications and morbidity. Vital signs were reviewed. Serious etiologies considered.  Record review:  Previous records obtained and reviewed   Additional history obtained from grandmother  Work up as above, notable for:  Labs & imaging results that were available during my care of the patient were reviewed by me and considered in my medical decision making. Labs stable, HGB stable.    I ordered imaging studies which included CT a/p and I independently visualized and interpreted imaging which showed colitis  Incidental adrenal adenoma on CT, advised her to f/u with pcp regarding this.   Management: Given IVF, pepcid, tylenol  Reassessment:  Pt reports she is much improved, tolerating PO. She has appt with GI tomorrow. Discussed supportive care and strict return precautions. Continue taking  steroids.   F.u with pcp regarding rpt potassium level.         This chart was dictated using voice recognition software.  Despite best efforts to proofread,  errors can occur which can change the documentation meaning.  Final Clinical Impression(s) / ED Diagnoses Final diagnoses:  Colitis  Renal calculus  Adrenal adenoma, unspecified laterality  Diarrhea, unspecified type  Mild dehydration  Hypokalemia    Rx / DC Orders ED Discharge Orders          Ordered    sucralfate (CARAFATE) 1 g tablet  3 times daily with meals & bedtime,   Status:  Discontinued        10/16/21 1216    sucralfate (CARAFATE) 1 g tablet  3 times daily with meals & bedtime        10/16/21 1216    dicyclomine (BENTYL) 20 MG tablet  2 times daily PRN        10/16/21 1217             Jeanell Sparrow, DO 10/17/21 1523

## 2021-10-16 NOTE — ED Notes (Signed)
Back from CT

## 2021-10-16 NOTE — Discharge Instructions (Addendum)
Please follow up with PCP in 3 days for repeat potassium level.  Please follow-up with gastroenterology tomorrow. Please follow up with PCP regarding adrenal adenoma for repeat imaging if needed in next 3 months.

## 2021-10-16 NOTE — ED Triage Notes (Signed)
Pt to ED from home with c/o undiagnosed Ulcerative Colitis episode with rectal bleeding, nausea, vomiting, fever and dizziness. Pt states her symptoms have been ongoing for 2 weeks but have progressed the last four days and she cannot wait for her GI appointment.

## 2021-10-16 NOTE — Telephone Encounter (Signed)
Patient has presumed ulcerative colitis. Seen for the first time by Dr. Silverio Decamp in late August 2022.  [redacted] weeks pregnant at the time.  Was having chronic diarrhea and rectal bleeding that had been present for many months.  Experiencing 10-20 episodes of diarrhea daily.  Had fecal calprotectin of 121 in January 2022 with mild CRP elevation at 15 initially, in May CRP was up to 73.  OB/GYN had started her on prednisone taper starting with 40 mg daily which improved the diarrhea down to 1 or 2 formed bowel movements a day at the time she was seen by Dr. Silverio Decamp in August. Subsequently underwent C-section 09/11/2021 at [facility].  Follow-up GI appointment for tomorrow 10/31. Now with a couple of weeks of worsening bloody diarrhea, cramping abdominal pain and then fevers, to level of 104.  Started on budesonide by GI within the last few days.  Nausea without emesis and tolerating small to medium amounts of bland food and liquids.  C. difficile negative on 10/26.  She was seen in the ED at [facility] this morning. CTAP with contrast shows colitis in the transverse, descending, sigmoid and rectum.  No complications, no pneumoperitoneum, obstruction, focal collection.  Incidental right lower lobe renal stone and right adrenal adenoma. WBCs 10.4.  Hb 12.2. Potassium was 3.1 and this was supplemented in the ED.  BUN and creatinine were normal.  Other than albumin of 3, LFTs normal.  Lipase normal.  hCG not elevated.  Urinalysis pertinent for presence of ketones and protein.  No nitrites, leukocytes, squamous epithelial cells, RBCs.  Coronavirus/SARS negative.  Influenza a and B PCR negative. In addition to oral potassium and IV fluids, she received famotidine, Tylenol She was discharged with prescription for dicyclomine and sucralfate.  Patient called because she still having fevers.  Tylenol cuts the fevers from 104 down to 101/102.  She wonders if she needs antibiotics.  Discussed case w Dr Candis Schatz.   Advised patient to keep the appointment she has with Dr. Silverio Decamp tomorrow.  She may need to be admitted for accelerated management of colitis pending how she is tomorrow.  Advised her that it was okay to use acetaminophen up to 3 g in a 24-hour period.  If she has significant worsening of her symptoms, she can return to the ED.  This felt like a satisfactory plan to her  Azucena Freed PA-C

## 2021-10-17 ENCOUNTER — Encounter: Payer: Self-pay | Admitting: Gastroenterology

## 2021-10-17 ENCOUNTER — Other Ambulatory Visit (INDEPENDENT_AMBULATORY_CARE_PROVIDER_SITE_OTHER): Payer: BC Managed Care – PPO

## 2021-10-17 ENCOUNTER — Ambulatory Visit (INDEPENDENT_AMBULATORY_CARE_PROVIDER_SITE_OTHER): Payer: BC Managed Care – PPO | Admitting: Gastroenterology

## 2021-10-17 VITALS — BP 112/66 | HR 105 | Ht 62.0 in | Wt 133.1 lb

## 2021-10-17 DIAGNOSIS — R197 Diarrhea, unspecified: Secondary | ICD-10-CM | POA: Diagnosis not present

## 2021-10-17 DIAGNOSIS — R509 Fever, unspecified: Secondary | ICD-10-CM

## 2021-10-17 DIAGNOSIS — K529 Noninfective gastroenteritis and colitis, unspecified: Secondary | ICD-10-CM | POA: Diagnosis not present

## 2021-10-17 DIAGNOSIS — K625 Hemorrhage of anus and rectum: Secondary | ICD-10-CM | POA: Diagnosis not present

## 2021-10-17 DIAGNOSIS — R110 Nausea: Secondary | ICD-10-CM

## 2021-10-17 LAB — COMPREHENSIVE METABOLIC PANEL
ALT: 11 U/L (ref 0–35)
AST: 9 U/L (ref 0–37)
Albumin: 3.1 g/dL — ABNORMAL LOW (ref 3.5–5.2)
Alkaline Phosphatase: 53 U/L (ref 39–117)
BUN: 11 mg/dL (ref 6–23)
CO2: 27 mEq/L (ref 19–32)
Calcium: 8.8 mg/dL (ref 8.4–10.5)
Chloride: 108 mEq/L (ref 96–112)
Creatinine, Ser: 0.66 mg/dL (ref 0.40–1.20)
GFR: 116.53 mL/min (ref >60.00)
Glucose, Bld: 96 mg/dL (ref 70–99)
Potassium: 3.6 mEq/L (ref 3.5–5.1)
Sodium: 143 mEq/L (ref 135–145)
Total Bilirubin: 0.5 mg/dL (ref 0.2–1.2)
Total Protein: 6.3 g/dL (ref 6.0–8.3)

## 2021-10-17 LAB — CBC WITH DIFFERENTIAL/PLATELET
Basophils Absolute: 0 10*3/uL (ref 0.0–0.1)
Basophils Relative: 0.2 % (ref 0.0–3.0)
Eosinophils Absolute: 0.1 10*3/uL (ref 0.0–0.7)
Eosinophils Relative: 1.5 % (ref 0.0–5.0)
HCT: 38.1 % (ref 36.0–46.0)
Hemoglobin: 12.4 g/dL (ref 12.0–15.0)
Lymphocytes Relative: 15.4 % (ref 12.0–46.0)
Lymphs Abs: 1.4 10*3/uL (ref 0.7–4.0)
MCHC: 32.5 g/dL (ref 30.0–36.0)
MCV: 93.1 fl (ref 78.0–100.0)
Monocytes Absolute: 1.1 10*3/uL — ABNORMAL HIGH (ref 0.1–1.0)
Monocytes Relative: 11.2 % (ref 3.0–12.0)
Neutro Abs: 6.7 10*3/uL (ref 1.4–7.7)
Neutrophils Relative %: 71.7 % (ref 43.0–77.0)
Platelets: 265 10*3/uL (ref 150.0–400.0)
RBC: 4.09 Mil/uL (ref 3.87–5.11)
RDW: 12.4 % (ref 11.5–15.5)
WBC: 9.4 10*3/uL (ref 4.0–10.5)

## 2021-10-17 LAB — SEDIMENTATION RATE: Sed Rate: 30 mm/hr — ABNORMAL HIGH (ref 0–20)

## 2021-10-17 LAB — HIGH SENSITIVITY CRP: CRP, High Sensitivity: 190.42 mg/L — ABNORMAL HIGH (ref 0.000–5.000)

## 2021-10-17 MED ORDER — PROMETHAZINE HCL 12.5 MG PO TABS: 12.5000 mg | ORAL_TABLET | Freq: Every day | ORAL | 0 refills | Status: DC | PRN

## 2021-10-17 MED ORDER — PREDNISONE 10 MG PO TABS: ORAL_TABLET | ORAL | 0 refills | Status: DC

## 2021-10-17 MED ORDER — CIPROFLOXACIN HCL 500 MG PO TABS: 500.0000 mg | ORAL_TABLET | Freq: Two times a day (BID) | ORAL | 0 refills | Status: DC

## 2021-10-17 MED ORDER — METRONIDAZOLE 500 MG PO TABS: 500.0000 mg | ORAL_TABLET | Freq: Three times a day (TID) | ORAL | 0 refills | Status: DC

## 2021-10-17 MED ORDER — ONDANSETRON HCL 4 MG PO TABS: 4.0000 mg | ORAL_TABLET | Freq: Every day | ORAL | 0 refills | Status: DC | PRN

## 2021-10-17 NOTE — Progress Notes (Signed)
Gloria Green    [id number]    07/25/1991  Primary Care Physician:Mitchell, L.Marlou Sa, MD  Referring Physician: Alroy Dust, Carlean Jews.Marlou Sa, [place] [employer] [place],  [place] [id number]   Chief complaint: Diarrhea, rectal bleeding, abdominal pain, fever  HPI: 30 year old very pleasant female, 5 weeks postpartum with complaints of recurrent diarrhea associated with rectal bleeding and abdominal pain in the past 2 weeks  Her symptoms have improved on prednisone taper and she was doing well until after she delivered her baby and has been off prednisone for few weeks  She is having multiple bowel movements per Gloria with blood.  She was in the ER with fever 104 yesterday.  She was noted to have mild hypokalemia.  No leukocytosis.  Respiratory panel for flu and COVID negative  CT abd & pelvis 10/16/2021 1. Colitis involving the transverse, descending and sigmoid colon and rectum. No evidence of bowel obstruction, focal collection or pneumoperitoneum. 2. 3 mm nonobstructing RIGHT LOWER pole renal calculus. 3. 1.5 x 2 cm RIGHT adrenal adenoma.  She is taking alternating Tylenol and ibuprofen with persistent high fever if she does not take Tylenol.  She is having lower abdominal pain and cramping, has generalized abdominal discomfort. Denies abnormal vaginal bleeding or discharge She is currently not breast-feeding  She has no appetite and is barely eating anything.     Past relevant Hx: Fecal calprotectin mildly elevated at 121 in Jan 2022   CRP mildly elevated 15 (Normal range 0-10)   CRP was elevated to 73 in May 2022   Outpatient Encounter Medications as of 10/17/2021  Medication Sig   acetaminophen (TYLENOL) 650 MG CR tablet Take 650 mg by mouth every 8 (eight) hours as needed for pain.   budesonide (ENTOCORT EC) 3 MG 24 hr capsule Take 3 capsules (9 mg total) by mouth daily.   dicyclomine (BENTYL) 20 MG tablet Take 1 tablet (20 mg total) by mouth 2 (two)  times daily as needed for up to 5 days for spasms.   ibuprofen (ADVIL) 800 MG tablet Take by mouth.   ondansetron (ZOFRAN) 4 MG tablet Take by mouth as needed.   sucralfate (CARAFATE) 1 g tablet Take 1 tablet (1 g total) by mouth 4 (four) times daily -  with meals and at bedtime for 5 days.   [DISCONTINUED] citalopram (CELEXA) 20 MG tablet Take 1/2 tablet nightly x 2 weeks then increase to whole tablet nightly   [DISCONTINUED] ferrous sulfate 325 (65 FE) MG tablet Take 1 tablet by mouth daily.   [DISCONTINUED] hydrocortisone (ANUSOL-HC) 25 MG suppository Place 1 suppository (25 mg total) rectally every 12 (twelve) hours.   [DISCONTINUED] Pramox-PE-Glycerin-Petrolatum (HEMORRHOIDAL EX) Apply 1 application topically as needed.   [DISCONTINUED] Prenatal Vit-Fe Fumarate-FA (PRENATAL VITAMIN AND MINERAL) 28-0.8 MG TABS Take 1 tablet by mouth daily.   No facility-administered encounter medications on file as of 10/17/2021.    Allergies as of 10/17/2021 - Review Complete 10/17/2021  Allergen Reaction Noted   Sulfa antibiotics Hives 04/02/2013   Latex Rash 06/09/2019   Wound dressing adhesive Rash 10/16/2021    Past Medical History:  Diagnosis Date   Anal fissure    Anemia    Anxiety    Hemorrhoid     Past Surgical History:  Procedure Laterality Date   CESAREAN SECTION  09/11/2021   COSMETIC SURGERY  12/18/2010   nose   MYOMECTOMY N/A 06/16/2019   Procedure: ABDOMINAL MYOMECTOMY;  Surgeon:  Molli Posey, MD;  Location: [facility];  Service: Gynecology;  Laterality: N/A;   WISDOM TOOTH EXTRACTION      Family History  Problem Relation Age of Onset   Hypertension Mother    Obesity Father    Hypertension Brother    Lung cancer Maternal Grandmother    Lung cancer Maternal Grandfather     Social History   Socioeconomic History   Marital status: Married    Spouse name: Not on file   Number of children: 2   Years of education: Not on file   Highest education  level: Not on file  Occupational History   Occupation: Teacher  Tobacco Use   Smoking status: Former    Types: Cigarettes    Quit date: 06/11/2017    Years since quitting: 4.3   Smokeless tobacco: Never  Vaping Use   Vaping Use: Former   Quit date: 10/11/2018  Substance and Sexual Activity   Alcohol use: Not Currently    Comment: occ   Drug use: Never   Sexual activity: Not on file  Other Topics Concern   Not on file  Social History Narrative   Not on file   Social Determinants of Health   Financial Resource Strain: Not on file  Food Insecurity: Not on file  Transportation Needs: Not on file  Physical Activity: Not on file  Stress: Not on file  Social Connections: Not on file  Intimate Partner Violence: Not on file      Review of systems: All other review of systems negative except as mentioned in the HPI.   Physical Exam: Vitals:   10/17/21 1409  BP: 112/66  Pulse: (!) 105   Body mass index is 24.35 kg/m. Gen:      No acute distress HEENT:  sclera anicteric Abd:      soft, non-tender; no palpable masses, no distension Ext:    No edema Neuro: alert and oriented x 3 Psych: normal mood and affect  Data Reviewed:  Reviewed labs, radiology imaging, old records and pertinent past GI work up   Assessment and Plan/Recommendations:  29 year old very pleasant female 5 weeks postpartum with fever to 104, generalized abdominal pain, diarrhea and rectal bleeding.  CT abdomen pelvis with findings of colitis, negative for abscess or fistula  Her symptoms are concerning for severe ulcerative colitis Will schedule for colonoscopy with biopsies to confirm The risks and benefits as well as alternatives of endoscopic procedure(s) have been discussed and reviewed. All questions answered. The patient agrees to proceed.   Will need to exclude acute infectious etiology, check GI pathogen panel Start Cipro 5 ofloxacin 500 mg twice daily and Flagyl 500 mg 3 times daily for  14 days Start prednisone 30 mg twice daily and will plan to taper by 10 mg every week until she reaches 20 mg daily and hold it for few weeks until we can get approval for biologic  Plan to obtain insurance approval to start biologic, prefer Rinvoq based on colonoscopy findings and biopsies  Follow-up CBC, CMP, sed rate  Use Zofran 4 mg daily as needed for nausea, also prescribed Phenergan 12.5 mg daily for severe symptoms  Return in 4 to 6 weeks   The patient was provided an opportunity to ask questions and all were answered. The patient agreed with the plan and demonstrated an understanding of the instructions.  Damaris Hippo , MD    CC: Alroy Dust, L.Marlou Sa, MD

## 2021-10-17 NOTE — Patient Instructions (Addendum)
Your provider has requested that you go to the basement level for lab work before leaving today. Press "B" on the elevator. The lab is located at the first door on the left as you exit the elevator.   You have been scheduled for a colonoscopy. Please follow written instructions given to you at your visit today.  Please pick up your prep supplies at the pharmacy within the next 1-3 days. If you use inhalers (even only as needed), please bring them with you on the day of your procedure.   We will send Prednisone to your pharmacy, You will take 30 mg twice a day for 1 week then decrease by 10 mg every week until 20 mg daily  We will send Flagyl,Cipro,zofran and phenergan to your pharmacy  Due to recent changes in healthcare laws, you may see the results of your imaging and laboratory studies on MyChart before your provider has had a chance to review them.  We understand that in some cases there may be results that are confusing or concerning to you. Not all laboratory results come back in the same time frame and the provider may be waiting for multiple results in order to interpret others.  Please give [place] 48 hours in order for your provider to thoroughly review all the results before contacting the office for clarification of your results.    If you are age 39 or older, your body mass index should be between 23-30. Your Body mass index is 24.35 kg/m. If this is out of the aforementioned range listed, please consider follow up with your Primary Care Provider.  If you are age 55 or younger, your body mass index should be between 19-25. Your Body mass index is 24.35 kg/m. If this is out of the aformentioned range listed, please consider follow up with your Primary Care Provider.   ________________________________________________________  The [place] [facility] providers would like to encourage you to use [facility] to communicate with providers for non-urgent requests or questions.  Due to long hold times on the  telephone, sending your provider a message by [facility] may be a faster and more efficient way to get a response.  Please allow 48 business hours for a response.  Please remember that this is for non-urgent requests.  _______________________________________________________   I appreciate the  opportunity to care for you  Thank You   Harl Bowie , MD

## 2021-10-18 ENCOUNTER — Other Ambulatory Visit: Payer: BC Managed Care – PPO

## 2021-10-18 DIAGNOSIS — K529 Noninfective gastroenteritis and colitis, unspecified: Secondary | ICD-10-CM

## 2021-10-18 DIAGNOSIS — K625 Hemorrhage of anus and rectum: Secondary | ICD-10-CM

## 2021-10-18 DIAGNOSIS — R509 Fever, unspecified: Secondary | ICD-10-CM

## 2021-10-18 DIAGNOSIS — R197 Diarrhea, unspecified: Secondary | ICD-10-CM

## 2021-10-20 ENCOUNTER — Telehealth: Payer: Self-pay | Admitting: Gastroenterology

## 2021-10-20 ENCOUNTER — Encounter: Payer: Self-pay | Admitting: Gastroenterology

## 2021-10-20 ENCOUNTER — Other Ambulatory Visit: Payer: Self-pay | Admitting: Gastroenterology

## 2021-10-20 DIAGNOSIS — A0472 Enterocolitis due to Clostridium difficile, not specified as recurrent: Secondary | ICD-10-CM

## 2021-10-20 LAB — GI PROFILE, STOOL, PCR

## 2021-10-20 MED ORDER — VANCOMYCIN HCL 125 MG PO CAPS: 125.0000 mg | ORAL_CAPSULE | Freq: Four times a day (QID) | ORAL | 0 refills | Status: AC

## 2021-10-20 NOTE — Telephone Encounter (Signed)
Agree she will need to come to ER and will need admission.  She will need IV fluids, IV steroids and IV antibiotics as she has failed oral therapy and is failing to thrive. We will plan to expedite work-up as inpatient to confirm diagnosis of ulcerative colitis.  I am CCing Dr. Lorenso Courier, she is covering inpatient GI service this week.

## 2021-10-20 NOTE — Telephone Encounter (Signed)
Inbound call from pt requesting a call back stating that she feels like her colitis isn't getting better. Please advise. Thank you.

## 2021-10-20 NOTE — Progress Notes (Signed)
Please see result note 

## 2021-10-20 NOTE — Telephone Encounter (Signed)
Patient says she is follow ing the recommendations and also taking her medications as prescribed. She is continuing to "go to the bathroom" 20 times a day. Urgency, nausea and blood with little stool. T100.2 about 3 hours ago. Reports extreme fatigue "sleeping all the time."

## 2021-10-20 NOTE — Telephone Encounter (Signed)
I spoke with the patient again. Confirmed she is taking Prednisone 30 mg twice daily. She states she is. Advised her to go to the ED as per our conversation. She is advised to go due to the fever, fatigue, 20+bloody bowel movements and failure to improve on her present medications. Patient agrees to this plan of care.

## 2021-10-21 NOTE — Telephone Encounter (Signed)
See lab report for stool study. Positive C diff. Patient contacted by Dr Silverio Decamp. Started on Vancomycin.

## 2021-10-23 ENCOUNTER — Telehealth: Payer: Self-pay

## 2021-10-23 NOTE — Telephone Encounter (Signed)
PA for Ortikos 9 mg ER Capsule submitted via CoverMyMeds.

## 2021-10-24 NOTE — Telephone Encounter (Signed)
Per [facility], patient's Ortikos has been approved from 10/23/21-10/23/22.  PA# [place] [id number] Non-Grandfathered [phone] EN

## 2021-10-26 ENCOUNTER — Telehealth: Payer: Self-pay | Admitting: Gastroenterology

## 2021-10-26 NOTE — Telephone Encounter (Signed)
Your recommendations?

## 2021-10-26 NOTE — Telephone Encounter (Signed)
Please advise sitz bath, avoid excessive straining during defecation and use Anusol cream per rectum twice daily X 10 days. Call with any worsening symptoms. Thanks

## 2021-10-26 NOTE — Telephone Encounter (Signed)
Inbound call from patient husband. States patient recently developed big external hemorrhoids within the last couple days. Wonder if she should have them treated before upcoming procedure 11/17. Best contact [phone]

## 2021-10-26 NOTE — Telephone Encounter (Signed)
Notified of the recommendations.

## 2021-10-27 ENCOUNTER — Encounter: Payer: Self-pay | Admitting: Gastroenterology

## 2021-11-03 ENCOUNTER — Ambulatory Visit (AMBULATORY_SURGERY_CENTER): Payer: BC Managed Care – PPO | Admitting: Gastroenterology

## 2021-11-03 ENCOUNTER — Encounter: Payer: Self-pay | Admitting: Gastroenterology

## 2021-11-03 ENCOUNTER — Other Ambulatory Visit: Payer: Self-pay

## 2021-11-03 VITALS — BP 106/69 | HR 69 | Temp 97.8°F | Resp 12 | Ht 62.0 in | Wt 133.0 lb

## 2021-11-03 DIAGNOSIS — K51211 Ulcerative (chronic) proctitis with rectal bleeding: Secondary | ICD-10-CM | POA: Diagnosis not present

## 2021-11-03 DIAGNOSIS — K640 First degree hemorrhoids: Secondary | ICD-10-CM | POA: Diagnosis not present

## 2021-11-03 DIAGNOSIS — R197 Diarrhea, unspecified: Secondary | ICD-10-CM

## 2021-11-03 DIAGNOSIS — K529 Noninfective gastroenteritis and colitis, unspecified: Secondary | ICD-10-CM

## 2021-11-03 DIAGNOSIS — K56609 Unspecified intestinal obstruction, unspecified as to partial versus complete obstruction: Secondary | ICD-10-CM

## 2021-11-03 MED ORDER — SODIUM CHLORIDE 0.9 % IV SOLN
500.0000 mL | Freq: Once | INTRAVENOUS | Status: DC
Start: 2021-11-03 — End: 2021-11-03

## 2021-11-03 NOTE — Op Note (Signed)
[facility] Patient Name: Gloria Green Procedure Date: 11/03/2021 8:03 AM MRN: [id number] Endoscopist: Mauri Pole , MD Age: 30 Referring MD:  Date of Birth: September 09, 1991 Gender: Female Account #: [id number] Procedure:                Colonoscopy Indications:              Evaluation of unexplained GI bleeding presenting                            with Hematochezia, Lower abdominal pain, Chronic                            diarrhea, Clinically significant diarrhea of                            unexplained origin Medicines:                Monitored Anesthesia Care Procedure:                Pre-Anesthesia Assessment:                           - Prior to the procedure, a History and Physical                            was performed, and patient medications and                            allergies were reviewed. The patient's tolerance of                            previous anesthesia was also reviewed. The risks                            and benefits of the procedure and the sedation                            options and risks were discussed with the patient.                            All questions were answered, and informed consent                            was obtained. Prior Anticoagulants: The patient has                            taken no previous anticoagulant or antiplatelet                            agents. ASA Grade Assessment: II - A patient with                            mild systemic disease. After reviewing the risks  and benefits, the patient was deemed in                            satisfactory condition to undergo the procedure.                           After obtaining informed consent, the colonoscope                            was passed under direct vision. Throughout the                            procedure, the patient's blood pressure, pulse, and                            oxygen saturations were monitored  continuously. The                            Olympus PCF-[id number] (#[id number]) Colonoscope was                            introduced through the anus with the intention of                            advancing to the cecum. The scope was advanced to                            the sigmoid colon before the procedure was aborted.                            Medications were given. The colonoscopy was                            somewhat difficult due to bowel stenosis. The                            patient tolerated the procedure well. The quality                            of the bowel preparation was adequate. The rectum                            was photographed. Scope In: 8:14:53 AM Scope Out: 8:26:11 AM Total Procedure Duration: 0 hours 11 minutes 18 seconds  Findings:                 The perianal and digital rectal examinations were                            normal.                           A benign-appearing, intrinsic mild stenosis was  found in the sigmoid colon associated with                            significant mucosal erythema, ulceration and                            friability, was non-traversed. Biopsies were taken                            with a cold forceps for histology.                           A continuous area of bleeding ulcerated mucosa with                            no stigmata of recent bleeding was present in the                            rectum, in the recto-sigmoid colon and in the                            sigmoid colon. Biopsies were taken with a cold                            forceps for histology.                           Non-bleeding external and internal hemorrhoids were                            found during retroflexion. The hemorrhoids were                            small. Complications:            No immediate complications. Estimated Blood Loss:     Estimated blood loss was minimal. Impression:               -  Stricture in the sigmoid colon. Biopsied.                           - Mucosal ulceration. Biopsied.                           - Non-bleeding external and internal hemorrhoids. Recommendation:           - Patient has a contact number available for                            emergencies. The signs and symptoms of potential                            delayed complications were discussed with the                            patient. Return to normal activities tomorrow.  Written discharge instructions were provided to the                            patient.                           - Continue present medications.                           - Await pathology results.                           - Repeat colonoscopy in 6-12 months to check                            healing and determine extent of colitis.                           - Low residue diet.                           - Continue Prednisone                           - Plan to start biologics ASAP for mucosal healing Mauri Pole, MD 11/03/2021 8:44:12 AM This report has been signed electronically.

## 2021-11-03 NOTE — Progress Notes (Signed)
PT taken to PACU. Monitors in place. VSS. Report given to RN. 

## 2021-11-03 NOTE — Patient Instructions (Signed)
YOU HAD AN ENDOSCOPIC PROCEDURE TODAY AT THE [facility]:   Refer to the procedure report that was given to you for any specific questions about what was found during the examination.  If the procedure report does not answer your questions, please call your gastroenterologist to clarify.  If you requested that your care partner not be given the details of your procedure findings, then the procedure report has been included in a sealed envelope for you to review at your convenience later.  YOU SHOULD EXPECT: Some feelings of bloating in the abdomen. Passage of more gas than usual.  Walking can help get rid of the air that was put into your GI tract during the procedure and reduce the bloating. If you had a lower endoscopy (such as a colonoscopy or flexible sigmoidoscopy) you may notice spotting of blood in your stool or on the toilet paper. If you underwent a bowel prep for your procedure, you may not have a normal bowel movement for a few days.  Please Note:  You might notice some irritation and congestion in your nose or some drainage.  This is from the oxygen used during your procedure.  There is no need for concern and it should clear up in a day or so.  SYMPTOMS TO REPORT IMMEDIATELY:   Following lower endoscopy (colonoscopy or flexible sigmoidoscopy):  Excessive amounts of blood in the stool  Significant tenderness or worsening of abdominal pains  Swelling of the abdomen that is new, acute  Fever of 100F or higher  For urgent or emergent issues, a gastroenterologist can be reached at any hour by calling [phone]. Do not use MyChart messaging for urgent concerns.    DIET:  We do recommend a small meal at first, but then you may proceed to your regular diet.  Drink plenty of fluids but you should avoid alcoholic beverages for 24 hours.  ACTIVITY:  You should plan to take it easy for the rest of today and you should NOT DRIVE or use heavy machinery until tomorrow (because  of the sedation medicines used during the test).    FOLLOW UP: Our staff will call the number listed on your records 48-72 hours following your procedure to check on you and address any questions or concerns that you may have regarding the information given to you following your procedure. If we do not reach you, we will leave a message.  We will attempt to reach you two times.  During this call, we will ask if you have developed any symptoms of COVID 19. If you develop any symptoms (ie: fever, flu-like symptoms, shortness of breath, cough etc.) before then, please call [phone].  If you test positive for Covid 19 in the 2 weeks post procedure, please call and report this information to us.    If any biopsies were taken you will be contacted by phone or by letter within the next 1-3 weeks.  Please call us at [phone] if you have not heard about the biopsies in 3 weeks.    SIGNATURES/CONFIDENTIALITY: You and/or your care partner have signed paperwork which will be entered into your electronic medical record.  These signatures attest to the fact that that the information above on your After Visit Summary has been reviewed and is understood.  Full responsibility of the confidentiality of this discharge information lies with you and/or your care-partner. 

## 2021-11-03 NOTE — Progress Notes (Signed)
Please refer to office visit note 10/17/21. No additional changes in H&P Patient is appropriate for planned procedure(s) and anesthesia in an ambulatory setting  K. Denzil Magnuson , MD [phone]

## 2021-11-03 NOTE — Progress Notes (Signed)
Pt's states no medical or surgical changes since previsit or office visit. VS assessed by C.W

## 2021-11-04 ENCOUNTER — Telehealth: Payer: Self-pay | Admitting: Gastroenterology

## 2021-11-04 MED ORDER — PREDNISONE 10 MG PO TABS: ORAL_TABLET | ORAL | 0 refills | Status: DC

## 2021-11-04 MED ORDER — PREDNISONE 20 MG PO TABS: ORAL_TABLET | ORAL | 1 refills | Status: DC

## 2021-11-04 NOTE — Telephone Encounter (Signed)
Patient called stated that she spoke with the Pharmacy and they do not have the refill order.

## 2021-11-04 NOTE — Telephone Encounter (Signed)
Inbound call from patient requesting medication refill for prednisone. [facility] [address]

## 2021-11-04 NOTE — Telephone Encounter (Signed)
Called patient and [facility] 20 mg #250

## 2021-11-04 NOTE — Telephone Encounter (Signed)
Sent into patients pharmacy

## 2021-11-07 ENCOUNTER — Telehealth: Payer: Self-pay | Admitting: *Deleted

## 2021-11-07 NOTE — Telephone Encounter (Signed)
  Follow up Call-  Call back number 11/03/2021  Post procedure Call Back phone  # [phone]  Permission to leave phone message Yes  Some recent data might be hidden     Patient questions:  Do you have a fever, pain , or abdominal swelling? No. Pain Score  0 *  Have you tolerated food without any problems? Yes.    Have you been able to return to your normal activities? Yes.    Do you have any questions about your discharge instructions: Diet   No. Medications  No. Follow up visit  No.  Do you have questions or concerns about your Care? No.  Actions: * If pain score is 4 or above: No action needed, pain <4.  Have you developed a fever since your procedure? no  2.   Have you had an respiratory symptoms (SOB or cough) since your procedure? no  3.   Have you tested positive for COVID 19 since your procedure no  4.   Have you had any family members/close contacts diagnosed with the COVID 19 since your procedure?  no   If yes to any of these questions please route to Joylene John, RN and Joella Prince, RN

## 2021-11-08 ENCOUNTER — Other Ambulatory Visit (INDEPENDENT_AMBULATORY_CARE_PROVIDER_SITE_OTHER): Payer: BC Managed Care – PPO

## 2021-11-08 ENCOUNTER — Telehealth: Payer: Self-pay | Admitting: Gastroenterology

## 2021-11-08 ENCOUNTER — Ambulatory Visit (INDEPENDENT_AMBULATORY_CARE_PROVIDER_SITE_OTHER)
Admission: RE | Admit: 2021-11-08 | Discharge: 2021-11-08 | Disposition: A | Discharge status: Home / Self Care | Payer: BC Managed Care – PPO | Source: Ambulatory Visit | Attending: Internal Medicine | Admitting: Internal Medicine

## 2021-11-08 ENCOUNTER — Other Ambulatory Visit: Payer: Self-pay

## 2021-11-08 DIAGNOSIS — K56699 Other intestinal obstruction unspecified as to partial versus complete obstruction: Secondary | ICD-10-CM

## 2021-11-08 DIAGNOSIS — K529 Noninfective gastroenteritis and colitis, unspecified: Secondary | ICD-10-CM

## 2021-11-08 DIAGNOSIS — R197 Diarrhea, unspecified: Secondary | ICD-10-CM

## 2021-11-08 LAB — CBC WITH DIFFERENTIAL/PLATELET
Basophils Absolute: 0 10*3/uL (ref 0.0–0.1)
Basophils Relative: 0.1 % (ref 0.0–3.0)
Eosinophils Absolute: 0 10*3/uL (ref 0.0–0.7)
Eosinophils Relative: 0.1 % (ref 0.0–5.0)
HCT: 34.1 % — ABNORMAL LOW (ref 36.0–46.0)
Hemoglobin: 11.1 g/dL — ABNORMAL LOW (ref 12.0–15.0)
Lymphocytes Relative: 13.1 % (ref 12.0–46.0)
Lymphs Abs: 1.1 10*3/uL (ref 0.7–4.0)
MCHC: 32.7 g/dL (ref 30.0–36.0)
MCV: 90.4 fl (ref 78.0–100.0)
Monocytes Absolute: 0.3 10*3/uL (ref 0.1–1.0)
Monocytes Relative: 3.1 % (ref 3.0–12.0)
Neutro Abs: 7.1 10*3/uL (ref 1.4–7.7)
Neutrophils Relative %: 83.6 % — ABNORMAL HIGH (ref 43.0–77.0)
Platelets: 485 10*3/uL — ABNORMAL HIGH (ref 150.0–400.0)
RBC: 3.77 Mil/uL — ABNORMAL LOW (ref 3.87–5.11)
RDW: 13.5 % (ref 11.5–15.5)
WBC: 8.4 10*3/uL (ref 4.0–10.5)

## 2021-11-08 LAB — COMPREHENSIVE METABOLIC PANEL
ALT: 12 U/L (ref 0–35)
AST: 9 U/L (ref 0–37)
Albumin: 2.9 g/dL — ABNORMAL LOW (ref 3.5–5.2)
Alkaline Phosphatase: 41 U/L (ref 39–117)
BUN: 15 mg/dL (ref 6–23)
CO2: 28 mEq/L (ref 19–32)
Calcium: 8.4 mg/dL (ref 8.4–10.5)
Chloride: 100 mEq/L (ref 96–112)
Creatinine, Ser: 0.84 mg/dL (ref 0.40–1.20)
GFR: 92.27 mL/min (ref >60.00)
Glucose, Bld: 208 mg/dL — ABNORMAL HIGH (ref 70–99)
Potassium: 3.3 mEq/L — ABNORMAL LOW (ref 3.5–5.1)
Sodium: 138 mEq/L (ref 135–145)
Total Bilirubin: 0.4 mg/dL (ref 0.2–1.2)
Total Protein: 6.1 g/dL (ref 6.0–8.3)

## 2021-11-08 MED ORDER — DIPHENOXYLATE-ATROPINE 2.5-0.025 MG PO TABS: ORAL_TABLET | ORAL | 0 refills | Status: DC

## 2021-11-08 MED ORDER — VANCOMYCIN HCL 125 MG PO CAPS
125.0000 mg | ORAL_CAPSULE | Freq: Four times a day (QID) | ORAL | 0 refills | Status: DC
Start: 2021-11-08 — End: 2021-11-14

## 2021-11-08 NOTE — Telephone Encounter (Signed)
Patient called states she had a procedure and is now experiencing a lot of abdominal pain and bleeding. Said she has Colitis and wants to know if she can take Imodium to help her.

## 2021-11-08 NOTE — Telephone Encounter (Signed)
Doc of the Day Patient of Dr Silverio Decamp. Came to [place] for diarrhea. Recent colonoscopy for this. Suspecting IBD. She has recently been treated for C Diff. She is not on Ortikos 9 mg. She has been on prednisone. She has been unsuccessful in tapering. She was on 50 mg of prednisone but increased this to 60 mg on her on 3 days ago. She has urgent nocturnal stools with blood, and "terrible stomach cramps that are worse than labor pains."  She is not taking Bentyl and did not recognize the name of the medication. Her colonoscopy was on 11/03/21. The biopsy results are now back. Please advise.

## 2021-11-08 NOTE — Telephone Encounter (Signed)
Labs and xrays back  Xray shows changes of thickened colon which fits with colitis  Labs show mild decrease in Hgb and mild decrease in K - not surprising given bleeding and diarrhea  Blood sugar very high from prednisone and not also a surprise  Recommendations:  Retreat C diff vancomycin, it may not be gone - I sent Rx - this should be ok for breast feeding FYI as limited systemic absorption  Stay hydrated as much as possible - sip fluids throughout the day - avoid very sugary drinks if possible  Lomotil Rx also sent - start with 1 tablet to see if that helps if not use 2 Take this instead of dicyclomine  If she worsens or continues the same after a few days call back  I hope she does not have to but in some cases inpatient care may be necessary

## 2021-11-08 NOTE — Telephone Encounter (Signed)
I reviewed the chart and called her.  I explained that the biopsies show inflammatory bowel disease.  She is having lots of diarrhea and pain overnight.  She found her dicyclomine and tried that.  She has had prednisone increased to 60 mg daily.  She cannot remember if treatment for C. difficile was helpful.   I have ordered abdominal films and CBC and c-Met for her to do.  Once I see the films I can make further treatment recommendations and will do so.  She knows to come on over and have these done this afternoon.

## 2021-11-08 NOTE — Telephone Encounter (Signed)
Thank you for your help Dr Carlean Purl. I have discussed the plan in detail with the patient.   The PA process of [employer]in[employer] has been started. Her insurance requires a trial and failure of Humira or a compelling reason it cannot be used.  I will update as this moves along.

## 2021-11-14 ENCOUNTER — Other Ambulatory Visit: Payer: Self-pay

## 2021-11-14 MED ORDER — VANCOMYCIN HCL 125 MG PO CAPS: ORAL_CAPSULE | ORAL | 0 refills | Status: DC

## 2021-11-14 MED ORDER — HUMIRA PEN 40 MG/0.8ML ~~LOC~~ PNKT: PEN_INJECTOR | SUBCUTANEOUS | 1 refills | Status: DC

## 2021-11-14 MED ORDER — HUMIRA (2 PEN) 40 MG/0.4ML ~~LOC~~ AJKT: 40.0000 mg | AUTO-INJECTOR | SUBCUTANEOUS | 11 refills | Status: DC

## 2021-11-14 NOTE — Telephone Encounter (Signed)
Humira approved. 6 month approval status. Enrolled in Nurse Ambassador program for injection training and nurse support. Savings card activated.

## 2021-11-14 NOTE — Telephone Encounter (Signed)
Thank you Olam Idler back and she is feeling somewhat better. She is agreeable to starting Humira. Beth, please check if we can expedite approval process Continue Prednsione 13m daily for additional 10 days and then decrease by 10 mg every week Use Lomotil every 6-8 hours as needed Continue Vancomycin for 10 days followed by taper dose 1259mthree times daily X 3 days,125 mg twice daily X 3 days, 12581maily X3 days and every other day for 3 capsules and stop.

## 2021-11-21 ENCOUNTER — Telehealth: Payer: Self-pay

## 2021-11-21 NOTE — Telephone Encounter (Signed)
Called patient. No answer. Left a message on the voicemail. Would like to talk with her about Humira. Asking if she has been in touch with her nurse Corliss Marcus, has she heard from the specialty pharmacy about her shipment and how is she doing in regards to her symptoms.

## 2021-11-22 DIAGNOSIS — K5190 Ulcerative colitis, unspecified, without complications: Secondary | ICD-10-CM | POA: Insufficient documentation

## 2021-11-22 NOTE — Telephone Encounter (Signed)
Faxed PA form from [facility] Training and development officer for [employer]. 30 pages including pertinent medical records, imaging and lab reports, procedure report and hospital record.

## 2021-11-22 NOTE — Telephone Encounter (Signed)
Spoke with the patient.  Her primary insurance is [employer]. Her primary insurance has not approved the Humira. Her secondary insurance has. I will send the [employer] the documentation for PA. Patient has tapered her prednisone and feels she is doing better.  She is having fewer nocturnal stools, reporting 3 to 4 in the night. Daytime she has 3 to 5 stools and with less blood. She has tried OTC Imodium for night time control, but found it to be ineffective. She is taking 4 Lomotil at hs to achieve the decrease of stools to 3-4 at night. This is different from the way it is prescribed. She needs a renewal and a change of the SIG as well.

## 2021-11-24 ENCOUNTER — Telehealth: Payer: Self-pay | Admitting: Gastroenterology

## 2021-11-24 ENCOUNTER — Other Ambulatory Visit: Payer: Self-pay

## 2021-11-24 ENCOUNTER — Other Ambulatory Visit: Payer: Self-pay | Admitting: Internal Medicine

## 2021-11-24 MED ORDER — DIPHENOXYLATE-ATROPINE 2.5-0.025 MG PO TABS: 2.0000 | ORAL_TABLET | Freq: Four times a day (QID) | ORAL | 1 refills | Status: AC | PRN

## 2021-11-24 MED ORDER — DIPHENOXYLATE-ATROPINE 2.5-0.025 MG PO TABS: 2.0000 | ORAL_TABLET | Freq: Four times a day (QID) | ORAL | 0 refills | Status: DC | PRN

## 2021-11-24 NOTE — Telephone Encounter (Signed)
Yes, Humira has been approved. She will receive her shipment Friday.

## 2021-11-24 NOTE — Telephone Encounter (Signed)
Patient called and stated that the insurance is refusing to pay for lomotil until Dec 19th. Please advise.  [phone]

## 2021-11-24 NOTE — Telephone Encounter (Signed)
Inbound call from [facility] states that [facility] not [facility] for Lom[facility]il. Please advise.  [phone]

## 2021-11-24 NOTE — Telephone Encounter (Signed)
Please send new Rx for Lomotil 1-2 tablets every 6 hours X 30 days and do we know if Humira is approved yet? Thanks

## 2021-11-24 NOTE — Telephone Encounter (Signed)
Please advise, okay to refill ?

## 2021-11-24 NOTE — Telephone Encounter (Signed)
She has tried OTC Imodium for night time control, but found it to be ineffective. She is taking 4 Lomotil at hs to achieve the decrease of stools to 3-4 at night. This is different from the way it is prescribed. She needs a renewal and a change of the SIG as well.

## 2021-11-25 NOTE — Telephone Encounter (Signed)
Called the patient to explain this. No answer. Left her some information on her voicemail.

## 2021-11-25 NOTE — Telephone Encounter (Signed)
No PA available for this medication. The insurance has a quantity limit for this medication.

## 2021-12-15 ENCOUNTER — Telehealth: Payer: Self-pay | Admitting: Gastroenterology

## 2021-12-15 NOTE — Telephone Encounter (Signed)
Spoke with [facility] and the [employer] for [employer]. The PA for Humira 40 mg / 0.48m has been in place since 11/15/21. The effective date will be 12/21/21. This is based on when the patient should need the injection.  This information has been given to the patient.  Total time spent on this is 60 minutes which includes phone calls only.

## 2021-12-15 NOTE — Telephone Encounter (Signed)
Patient called stating that she had received her first dose of Humira without any issues. She is in need of the second dose and insurance stated that she needed a prior authorization. Please advise.

## 2021-12-15 NOTE — Telephone Encounter (Signed)
Humira 40 mg / 0.4 ml approved by [facility]. Called [facility] [employer]. They will check for approval status today. Asked for a drop ship due to the urgent status of the prescription.

## 2021-12-20 ENCOUNTER — Telehealth: Payer: Self-pay | Admitting: Gastroenterology

## 2021-12-20 MED ORDER — ONDANSETRON HCL 4 MG PO TABS: 4.0000 mg | ORAL_TABLET | Freq: Every day | ORAL | 0 refills | Status: DC | PRN

## 2021-12-20 NOTE — Telephone Encounter (Signed)
Inbound call from patient stating Zofran medication is needing a prior authorization.  Please advise.

## 2021-12-20 NOTE — Telephone Encounter (Signed)
We have not received anything from Pharmacy about prior auth  Will check on Cover My Meds

## 2021-12-23 NOTE — Telephone Encounter (Signed)
Ok agree, please schedule follow up visit with me next available. thanks

## 2021-12-23 NOTE — Telephone Encounter (Signed)
Prior auth done for Zofran,  Called pt to inform she said she was getting more nauseated now after she eats. I recommended her to try an OTC Omeprazole 30 minutes before breakfast in the AM and she can use Pepcid at bedtime. She said she will try that and see if it helps. If not she will call back to schedule an appointment with Dr Silverio Decamp

## 2021-12-28 ENCOUNTER — Telehealth: Payer: Self-pay

## 2021-12-28 NOTE — Telephone Encounter (Signed)
Please send prescription for Anusol suppository to use at bedtime daily for 7 to 10 days to help with bleeding from internal hemorrhoids.  Please send Rx for 12 units with 2 refills.  Will reassess when she comes in for her follow-up office visit.  Thanks

## 2021-12-28 NOTE — Telephone Encounter (Signed)
Inbound call from patient states she have questions about her Zofran prescription and if she need to continue using the suppositories because she is still experiencing blood.

## 2021-12-28 NOTE — Telephone Encounter (Signed)
Triage call

## 2021-12-28 NOTE — Telephone Encounter (Signed)
Called patient back and got her voice mail. Left message again.

## 2021-12-28 NOTE — Telephone Encounter (Signed)
Returned patient's call, left message to please call back

## 2021-12-28 NOTE — Telephone Encounter (Signed)
Patient returned your call.  Please call back.  Thank you.

## 2021-12-28 NOTE — Telephone Encounter (Signed)
Spoke with patient and she states her internal hemorrhoids have started bleeding and wants to know if she should use the Mesalamine suppositories she has. Also scheduled office visit with Tye Savoy NP on 01/11/22

## 2021-12-29 ENCOUNTER — Other Ambulatory Visit: Payer: Self-pay

## 2021-12-29 DIAGNOSIS — K649 Unspecified hemorrhoids: Secondary | ICD-10-CM

## 2021-12-29 MED ORDER — HYDROCORTISONE ACETATE 25 MG RE SUPP: RECTAL | 2 refills | Status: DC

## 2021-12-29 NOTE — Telephone Encounter (Signed)
Rx sent to pt preferred pharmacy. Called pt to make her aware of Rx. LVM requesting returned call.

## 2021-12-29 NOTE — Telephone Encounter (Signed)
Outpatient Medication Detail   Disp Refills Start End   hydrocortisone (ANUSOL-HC) 25 MG suppository 12 suppository 2 12/29/2021    Sig: Insert 1 suppository rectally at bedtime for 7 to 10 days to help with bleeding from internal hemorrhoids.   Sent to pharmacy as: hydrocortisone (ANUSOL-HC) 25 MG suppository   E-Prescribing Status: Receipt confirmed by pharmacy (12/29/2021  8:37 AM EST)

## 2021-12-29 NOTE — Telephone Encounter (Signed)
Pt returned call. Advised about Rx. States she is still having some rectal bleeding, although not significant. Will pick up Rx and begin use. If still having rectal bleeding and develops worsening symptoms of dizziness, dyspnea, headache, fatigue, states she will call the office to determine need for bloodwork.

## 2022-01-05 ENCOUNTER — Telehealth: Payer: Self-pay

## 2022-01-05 DIAGNOSIS — K625 Hemorrhage of anus and rectum: Secondary | ICD-10-CM

## 2022-01-05 DIAGNOSIS — K649 Unspecified hemorrhoids: Secondary | ICD-10-CM

## 2022-01-05 NOTE — Telephone Encounter (Signed)
Ok to use mesalamine suppositories, please check if insurance will cover Analapram 2.5% rectal cream , she can apply it twice daily for 7-10 days. Thanks

## 2022-01-05 NOTE — Telephone Encounter (Signed)
Spoke to patient and she states her Ins. Will not cover the Anusol Suppositories, wants to know if she can use the Mesalamine Suppositories she has left. 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

## 2022-01-06 MED ORDER — MESALAMINE 1000 MG RE SUPP: 1000.0000 mg | Freq: Every day | RECTAL | 1 refills | Status: DC

## 2022-01-06 MED ORDER — HYDROCORT-PRAMOXINE (PERIANAL) 2.5-1 % EX CREA: 1.0000 "application " | TOPICAL_CREAM | Freq: Two times a day (BID) | CUTANEOUS | 0 refills | Status: DC

## 2022-01-06 NOTE — Telephone Encounter (Signed)
Called pt and informed about Dr. Woodward Ku recommendations as documented below. Verbalized acceptance and understanding. D/c'd Anusol supp d/t insurance coverage. New Rx sent to [facility] for rectal cream. Awaiting pt returned call re: whether she needs a refill for Mesalamine supp.

## 2022-01-06 NOTE — Telephone Encounter (Signed)
SECOND ATTEMPT:  Called pt to inquire if she is in need of refill for Mesalamine supp. LVM requesting returned call.

## 2022-01-06 NOTE — Addendum Note (Signed)
Addended by: Hardie Pulley, Acsa Estey J on: 01/06/2022 11:00 AM   Modules accepted: Orders

## 2022-01-11 ENCOUNTER — Ambulatory Visit: Payer: BC Managed Care – PPO | Admitting: Nurse Practitioner

## 2022-01-12 ENCOUNTER — Other Ambulatory Visit: Payer: Self-pay

## 2022-01-12 ENCOUNTER — Telehealth: Payer: Self-pay | Admitting: Gastroenterology

## 2022-01-12 ENCOUNTER — Inpatient Hospital Stay (HOSPITAL_COMMUNITY)
Admission: EM | Admit: 2022-01-12 | Discharge: 2022-01-20 | DRG: 386 | Disposition: A | Discharge status: Home / Self Care | Payer: BC Managed Care – PPO | Attending: Internal Medicine | Admitting: Internal Medicine

## 2022-01-12 ENCOUNTER — Encounter (HOSPITAL_COMMUNITY): Payer: Self-pay

## 2022-01-12 ENCOUNTER — Other Ambulatory Visit (INDEPENDENT_AMBULATORY_CARE_PROVIDER_SITE_OTHER): Payer: BC Managed Care – PPO

## 2022-01-12 DIAGNOSIS — R770 Abnormality of albumin: Secondary | ICD-10-CM | POA: Diagnosis not present

## 2022-01-12 DIAGNOSIS — Z79899 Other long term (current) drug therapy: Secondary | ICD-10-CM

## 2022-01-12 DIAGNOSIS — K922 Gastrointestinal hemorrhage, unspecified: Secondary | ICD-10-CM | POA: Diagnosis present

## 2022-01-12 DIAGNOSIS — D649 Anemia, unspecified: Secondary | ICD-10-CM | POA: Diagnosis present

## 2022-01-12 DIAGNOSIS — K5190 Ulcerative colitis, unspecified, without complications: Secondary | ICD-10-CM | POA: Diagnosis present

## 2022-01-12 DIAGNOSIS — Z87891 Personal history of nicotine dependence: Secondary | ICD-10-CM

## 2022-01-12 DIAGNOSIS — D62 Acute posthemorrhagic anemia: Secondary | ICD-10-CM | POA: Diagnosis present

## 2022-01-12 DIAGNOSIS — K5100 Ulcerative (chronic) pancolitis without complications: Secondary | ICD-10-CM

## 2022-01-12 DIAGNOSIS — Z882 Allergy status to sulfonamides status: Secondary | ICD-10-CM

## 2022-01-12 DIAGNOSIS — K648 Other hemorrhoids: Secondary | ICD-10-CM | POA: Diagnosis present

## 2022-01-12 DIAGNOSIS — K30 Functional dyspepsia: Secondary | ICD-10-CM | POA: Diagnosis present

## 2022-01-12 DIAGNOSIS — K51011 Ulcerative (chronic) pancolitis with rectal bleeding: Secondary | ICD-10-CM | POA: Diagnosis not present

## 2022-01-12 DIAGNOSIS — Z79620 Long term (current) use of immunosuppressive biologic: Secondary | ICD-10-CM

## 2022-01-12 DIAGNOSIS — F419 Anxiety disorder, unspecified: Secondary | ICD-10-CM | POA: Diagnosis present

## 2022-01-12 DIAGNOSIS — K644 Residual hemorrhoidal skin tags: Secondary | ICD-10-CM | POA: Diagnosis present

## 2022-01-12 DIAGNOSIS — E876 Hypokalemia: Secondary | ICD-10-CM | POA: Diagnosis not present

## 2022-01-12 DIAGNOSIS — Z20822 Contact with and (suspected) exposure to covid-19: Secondary | ICD-10-CM | POA: Diagnosis present

## 2022-01-12 DIAGNOSIS — Z91040 Latex allergy status: Secondary | ICD-10-CM

## 2022-01-12 DIAGNOSIS — Z8249 Family history of ischemic heart disease and other diseases of the circulatory system: Secondary | ICD-10-CM

## 2022-01-12 DIAGNOSIS — K51911 Ulcerative colitis, unspecified with rectal bleeding: Secondary | ICD-10-CM | POA: Diagnosis not present

## 2022-01-12 DIAGNOSIS — D849 Immunodeficiency, unspecified: Secondary | ICD-10-CM | POA: Diagnosis present

## 2022-01-12 DIAGNOSIS — R000 Tachycardia, unspecified: Secondary | ICD-10-CM | POA: Diagnosis present

## 2022-01-12 DIAGNOSIS — G4700 Insomnia, unspecified: Secondary | ICD-10-CM | POA: Diagnosis present

## 2022-01-12 HISTORY — DX: Ulcerative colitis, unspecified, without complications: K51.90

## 2022-01-12 LAB — URINALYSIS, ROUTINE W REFLEX MICROSCOPIC
Bacteria, UA: NONE SEEN
Bilirubin Urine: NEGATIVE
Glucose, UA: NEGATIVE mg/dL
Ketones, ur: 5 mg/dL — AB
Leukocytes,Ua: NEGATIVE
Nitrite: NEGATIVE
Protein, ur: NEGATIVE mg/dL
Specific Gravity, Urine: 1.024 (ref 1.005–1.030)
pH: 5 (ref 5.0–8.0)

## 2022-01-12 LAB — COMPREHENSIVE METABOLIC PANEL
ALT: 8 U/L (ref 0–35)
ALT: 12 U/L (ref 0–44)
AST: 11 U/L (ref 0–37)
AST: 14 U/L — ABNORMAL LOW (ref 15–41)
Albumin: 2.8 g/dL — ABNORMAL LOW (ref 3.5–5.2)
Albumin: 2.5 g/dL — ABNORMAL LOW (ref 3.5–5.0)
Alkaline Phosphatase: 37 U/L — ABNORMAL LOW (ref 39–117)
Alkaline Phosphatase: 38 U/L (ref 38–126)
Anion gap: 5 (ref 5–15)
BUN: 10 mg/dL (ref 6–23)
BUN: 12 mg/dL (ref 6–20)
CO2: 28 mEq/L (ref 19–32)
CO2: 26 mmol/L (ref 22–32)
Calcium: 8.1 mg/dL — ABNORMAL LOW (ref 8.4–10.5)
Calcium: 8.3 mg/dL — ABNORMAL LOW (ref 8.9–10.3)
Chloride: 109 mEq/L (ref 96–112)
Chloride: 107 mmol/L (ref 98–111)
Creatinine, Ser: 0.81 mg/dL (ref 0.40–1.20)
Creatinine, Ser: 0.81 mg/dL (ref 0.44–1.00)
GFR, Estimated: >60 mL/min (ref >60)
GFR: 96.27 mL/min (ref >60.00)
Glucose, Bld: 92 mg/dL (ref 70–99)
Glucose, Bld: 92 mg/dL (ref 70–99)
Potassium: 3.9 mEq/L (ref 3.5–5.1)
Potassium: 3.1 mmol/L — ABNORMAL LOW (ref 3.5–5.1)
Sodium: 142 mEq/L (ref 135–145)
Sodium: 138 mmol/L (ref 135–145)
Total Bilirubin: 0.3 mg/dL (ref 0.2–1.2)
Total Bilirubin: 0.4 mg/dL (ref 0.3–1.2)
Total Protein: 5.8 g/dL — ABNORMAL LOW (ref 6.0–8.3)
Total Protein: 6.1 g/dL — ABNORMAL LOW (ref 6.5–8.1)

## 2022-01-12 LAB — CBC WITH DIFFERENTIAL/PLATELET
Abs Immature Granulocytes: 0.1 10*3/uL — ABNORMAL HIGH (ref 0.00–0.07)
Basophils Absolute: 0.1 10*3/uL (ref 0.0–0.1)
Basophils Absolute: 0 10*3/uL (ref 0.0–0.1)
Basophils Relative: 0.7 % (ref 0.0–3.0)
Basophils Relative: 0 %
Eosinophils Absolute: 1.3 10*3/uL — ABNORMAL HIGH (ref 0.0–0.7)
Eosinophils Absolute: 1.2 10*3/uL — ABNORMAL HIGH (ref 0.0–0.5)
Eosinophils Relative: 12.7 % — ABNORMAL HIGH (ref 0.0–5.0)
Eosinophils Relative: 13 %
HCT: 18.3 % — CL (ref 36.0–46.0)
HCT: 19.5 % — ABNORMAL LOW (ref 36.0–46.0)
Hemoglobin: 5.5 g/dL — CL (ref 12.0–15.0)
Hemoglobin: 5.4 g/dL — CL (ref 12.0–15.0)
Immature Granulocytes: 1 %
Lymphocytes Relative: 35 % (ref 12.0–46.0)
Lymphocytes Relative: 39 %
Lymphs Abs: 3.5 10*3/uL (ref 0.7–4.0)
Lymphs Abs: 3.6 10*3/uL (ref 0.7–4.0)
MCH: 21.3 pg — ABNORMAL LOW (ref 26.0–34.0)
MCHC: 29.8 g/dL — ABNORMAL LOW (ref 30.0–36.0)
MCHC: 27.7 g/dL — ABNORMAL LOW (ref 30.0–36.0)
MCV: 70.6 fl — ABNORMAL LOW (ref 78.0–100.0)
MCV: 76.8 fL — ABNORMAL LOW (ref 80.0–100.0)
Monocytes Absolute: 1 10*3/uL (ref 0.1–1.0)
Monocytes Absolute: 0.8 10*3/uL (ref 0.1–1.0)
Monocytes Relative: 9.5 % (ref 3.0–12.0)
Monocytes Relative: 8 %
Neutro Abs: 4.3 10*3/uL (ref 1.4–7.7)
Neutro Abs: 3.7 10*3/uL (ref 1.7–7.7)
Neutrophils Relative %: 42.1 % — ABNORMAL LOW (ref 43.0–77.0)
Neutrophils Relative %: 39 %
Platelets: 394 10*3/uL (ref 150.0–400.0)
Platelets: 445 10*3/uL — ABNORMAL HIGH (ref 150–400)
RBC: 2.59 Mil/uL — ABNORMAL LOW (ref 3.87–5.11)
RBC: 2.54 MIL/uL — ABNORMAL LOW (ref 3.87–5.11)
RDW: 21.4 % — ABNORMAL HIGH (ref 11.5–15.5)
RDW: 18.7 % — ABNORMAL HIGH (ref 11.5–15.5)
WBC: 10.1 10*3/uL (ref 4.0–10.5)
WBC: 9.4 10*3/uL (ref 4.0–10.5)
nRBC: 0.3 % — ABNORMAL HIGH (ref 0.0–0.2)

## 2022-01-12 LAB — RESP PANEL BY RT-PCR (FLU A&B, COVID) ARPGX2
Influenza A by PCR: NEGATIVE
Influenza B by PCR: NEGATIVE
SARS Coronavirus 2 by RT PCR: NEGATIVE

## 2022-01-12 LAB — LIPASE, BLOOD: Lipase: 54 U/L — ABNORMAL HIGH (ref 11–51)

## 2022-01-12 LAB — PREPARE RBC (CROSSMATCH)

## 2022-01-12 LAB — HIGH SENSITIVITY CRP: CRP, High Sensitivity: 10.06 mg/L — ABNORMAL HIGH (ref 0.000–5.000)

## 2022-01-12 LAB — PROTIME-INR
INR: 0.9 (ref 0.8–1.2)
Prothrombin Time: 12.6 seconds (ref 11.4–15.2)

## 2022-01-12 MED ORDER — ACETAMINOPHEN 325 MG PO TABS: 650.0000 mg | ORAL_TABLET | Freq: Four times a day (QID) | ORAL | Status: DC | PRN

## 2022-01-12 MED ORDER — SODIUM CHLORIDE 0.9% IV SOLUTION
Freq: Once | INTRAVENOUS | Status: AC
  Administered 2022-01-12: 10 mL/h via INTRAVENOUS

## 2022-01-12 MED ORDER — SODIUM CHLORIDE 0.9% FLUSH
3.0000 mL | Freq: Two times a day (BID) | INTRAVENOUS | Status: DC
  Administered 2022-01-12 – 2022-01-19 (×14): 3 mL via INTRAVENOUS

## 2022-01-12 MED ORDER — SODIUM CHLORIDE 0.9 % IV SOLN: 10.0000 mL/h | Freq: Once | INTRAVENOUS | Status: DC

## 2022-01-12 MED ORDER — ONDANSETRON HCL 4 MG/2ML IJ SOLN: 4.0000 mg | Freq: Four times a day (QID) | INTRAMUSCULAR | Status: DC | PRN

## 2022-01-12 MED ORDER — POTASSIUM CHLORIDE CRYS ER 20 MEQ PO TBCR
20.0000 meq | EXTENDED_RELEASE_TABLET | Freq: Once | ORAL | Status: AC
  Administered 2022-01-12: 20 meq via ORAL
  Filled 2022-01-12: qty 1

## 2022-01-12 MED ORDER — ONDANSETRON HCL 4 MG/2ML IJ SOLN
4.0000 mg | Freq: Once | INTRAMUSCULAR | Status: AC
  Administered 2022-01-12: 4 mg via INTRAVENOUS
  Filled 2022-01-12: qty 2

## 2022-01-12 MED ORDER — ONDANSETRON HCL 4 MG PO TABS
4.0000 mg | ORAL_TABLET | Freq: Four times a day (QID) | ORAL | Status: DC | PRN
Start: 2022-01-12 — End: 2022-01-20
  Administered 2022-01-13 (×2): 4 mg via ORAL
  Filled 2022-01-12 (×2): qty 1

## 2022-01-12 MED ORDER — ACETAMINOPHEN 650 MG RE SUPP: 650.0000 mg | Freq: Four times a day (QID) | RECTAL | Status: DC | PRN

## 2022-01-12 MED ORDER — POTASSIUM CHLORIDE IN NACL 20-0.9 MEQ/L-% IV SOLN
INTRAVENOUS | Status: AC
  Administered 2022-01-12: 100 mL/h via INTRAVENOUS
  Filled 2022-01-12: qty 1000

## 2022-01-12 NOTE — Telephone Encounter (Signed)
Hgb 5.5 It was 11 in November Chart review indicates having black stools - assume melena  Please tell patient to go to ED for eval - I suggest [facility]  She has a dangerously low hemoglobin and needs urgent evaluation and treatment

## 2022-01-12 NOTE — ED Triage Notes (Signed)
Patient was sent for a Hgb-5.5. patient has ulcerative colitis and states she has had blood in her stools x 4 months. Patient has swelling her face x 3 days. Patient reports swelling in her feet and ankles since this AM.

## 2022-01-12 NOTE — Telephone Encounter (Signed)
Spoke with the patient. She reports night stools that are "rabbit-like pellets, very dark and floating" then daytime stools are her normal and brown. She states she is overall much better with stools 4 to 5 times at night. She takes Imodium 2 daily when she needs, but has not taken any at all today. Does not use Lomotil at all now. Also, she notes she has swollen hands, ankles and feet. This is new this week. She first "thought" her face was swollen on Monday. That evening she noted her ankles were puffy. No rash. No difficulty swallowing or changes in ability to breathe.She is unable to come in at all today.

## 2022-01-12 NOTE — Telephone Encounter (Signed)
Review the plan with the patient. Patient will come for labs this late afternoon before 4:30 pm. She does not have any leave from her job. She has an appointment with APP on 02/06/22. Virtual appointment openings only prior to this. I will monitor the schedules for an earlier appointment.

## 2022-01-12 NOTE — Telephone Encounter (Signed)
Spoke to the patient and explained the results and the plan. She agrees with this plan of care. She will present to the ED at [facility] as quickly as possible.

## 2022-01-12 NOTE — Telephone Encounter (Signed)
Please have her come in for labs this morning, CBC, CMP, CRP and pre albumin Stop routine use of Imodium and Lomotil, can use as needed if having any watery diarrhea.  Her albumin level was low, encourage to eat more protein in the diet. Please bring her in for urgent visit with me or APP soon. Thanks

## 2022-01-12 NOTE — ED Notes (Signed)
Dr. Matilde Sprang at bedside assessing pt at this time. Pt mother in room.

## 2022-01-12 NOTE — Telephone Encounter (Signed)
Inbound call from patient states she have been experiencing dark stool almost black throughout the night. States she is taking only Humira and is experiencing swollen face, hands, ankles, and feet.

## 2022-01-12 NOTE — H&P (Signed)
History and Physical    Sonal Dorwart OFB:[id number] DOB: 01-05-1991 DOA: 01/12/2022  PCP: Alroy Dust, L.Marlou Sa, MD   Patient coming from: Home   Chief Complaint: Low Hgb, swelling, fatigue, tachycardia   HPI: Gloria Green is a pleasant 31 y.o. female with medical history significant for ulcerative colitis on Humira, now presenting to the emergency department for evaluation of low hemoglobin on outpatient labs, fatigue, tachycardia, and swelling.  The patient reports approximately 4 months of GI bleeding and approximately 2 weeks of increased fatigue and resting tachycardia.  She has had bright red blood per rectum for the past 4 months but has also had some very dark stool over the past couple days.  She denies any recent fevers or chills and reports that her chronic abdominal discomfort has not changed recently.  She had been on prednisone but completed a taper within the past couple weeks.  She reports recent bilateral lower leg swelling and also felt that her hands and face may have been swollen this morning.  She denies vomiting.  She called her GI clinic with these complaints, blood work was obtained, and she was directed to the ED for evaluation of hemoglobin 5.5.  ED Course: Upon arrival to the ED, patient is found to be afebrile, saturating well on room air, tachycardic to 110s, and with stable blood pressure.  CBC notable for hemoglobin 5.4 with MCV 76.8.  Chemistry panel features and albumin of 2.5 and potassium 3.1.  GI was consulted by the ED physician and 2 units RBC were ordered for transfusion.  Review of Systems:  All other systems reviewed and apart from HPI, are negative.  Past Medical History:  Diagnosis Date   Anal fissure    Anemia    Anxiety    Hemorrhoid    Ulcerative colitis [facility])     Past Surgical History:  Procedure Laterality Date   CESAREAN SECTION  09/11/2021   COSMETIC SURGERY  12/18/2010   nose   MYOMECTOMY N/A 06/16/2019   Procedure: ABDOMINAL  MYOMECTOMY;  Surgeon: Molli Posey, MD;  Location: [facility];  Service: Gynecology;  Laterality: N/A;   WISDOM TOOTH EXTRACTION      Social History:   reports that she quit smoking about 4 years ago. Her smoking use included cigarettes. She has never used smokeless tobacco. She reports that she does not currently use alcohol. She reports that she does not use drugs.  Allergies  Allergen Reactions   Sulfa Antibiotics Hives   Latex Rash   Wound Dressing Adhesive Rash    Skin Tearing    Family History  Problem Relation Age of Onset   Hypertension Mother    Obesity Father    Hypertension Brother    Lung cancer Maternal Grandmother    Lung cancer Maternal Grandfather      Prior to Admission medications   Medication Sig Start Date End Date Taking? Authorizing Provider  Adalimumab (HUMIRA PEN) 40 MG/0.4ML PNKT Inject 40 mg into the skin every 14 (fourteen) days. 11/14/21  Yes Nandigam, Venia Minks, MD  citalopram (CELEXA) 20 MG tablet Take 20 mg by mouth daily.   Yes [provider]  diphenoxylate-atropine (LOMOTIL) 2.5-0.025 MG tablet Take 1 tablet by mouth daily as needed for diarrhea or loose stools.   Yes [provider]  loperamide (IMODIUM) 2 MG capsule Take 2 mg by mouth daily as needed for diarrhea or loose stools.   Yes [provider]  ondansetron (ZOFRAN) 4 MG tablet Take  1 tablet (4 mg total) by mouth daily as needed for nausea or vomiting. 12/20/21  Yes Nandigam, Venia Minks, MD  dicyclomine (BENTYL) 20 MG tablet Take 1 tablet (20 mg total) by mouth 2 (two) times daily as needed for up to 5 days for spasms. Patient not taking: Reported on 01/12/2022 10/16/21 11/08/21  Jeanell Sparrow, DO  hydrocortisone-pramoxine [facility]) 2.5-1 % rectal cream Place 1 application rectally 2 (two) times daily for 10 days. Patient not taking: Reported on 01/12/2022 01/06/22 01/16/22  Mauri Pole, MD  mesalamine (CANASA) 1000 MG suppository Place  1 suppository (1,000 mg total) rectally at bedtime. Patient not taking: Reported on 01/12/2022 01/06/22   Mauri Pole, MD  predniSONE (DELTASONE) 10 MG tablet Take 30 mg twice a day for 1 week then decrease by 10 mg every week until 20 mg daily Patient not taking: Reported on 01/12/2022 11/04/21   Mauri Pole, MD  predniSONE (DELTASONE) 20 MG tablet 60 mg daily Patient not taking: Reported on 01/12/2022 11/04/21   Mauri Pole, MD  promethazine (PHENERGAN) 12.5 MG tablet Take 1 tablet (12.5 mg total) by mouth daily as needed for nausea or vomiting. Patient not taking: Reported on 01/12/2022 10/17/21   Mauri Pole, MD  sucralfate (CARAFATE) 1 g tablet Take 1 tablet (1 g total) by mouth 4 (four) times daily -  with meals and at bedtime for 5 days. Patient not taking: Reported on 01/12/2022 10/16/21 10/21/21  Jeanell Sparrow, DO  vancomycin (VANCOCIN) 125 MG capsule On day 11 start 1 capsule 3 times daily x 3 days then twice daily x 3 days then once daily x 3 days then QOD x 3 days then d/c Patient not taking: Reported on 01/12/2022 11/14/21   Mauri Pole, MD    Physical Exam: Vitals:   01/12/22 1907 01/12/22 1930 01/12/22 2000 01/12/22 2106  BP: 113/86 122/75 123/72 120/83  Pulse: (!) 113 (!) 109 (!) 106 (!) 108  Resp: 18 17 18 15   Temp:  (!) 97.5 F (36.4 C)  98 F (36.7 C)  TempSrc:  Oral  Oral  SpO2: 99% 100% 99% 98%  Weight:   54.4 kg   Height:   5' 1"  (1.549 m)     Constitutional: NAD, calm  Eyes: PERTLA, lids and conjunctivae normal ENMT: Mucous membranes are moist. Posterior pharynx clear of any exudate or lesions.   Neck: supple, no masses  Respiratory:  no wheezing, no crackles. No accessory muscle use.  Cardiovascular: Rate ~110 and regular. Trace pretibial edema bilaterally. No significant JVD. Abdomen: No distension, soft, no guarding or rebound pain. Bowel sounds active.  Musculoskeletal: no clubbing / cyanosis. No joint deformity upper and  lower extremities.   Skin: no significant rashes, lesions, ulcers. Warm, dry, well-perfused. Neurologic: CN 2-12 grossly intact. Moving all extremities. Alert and oriented.  Psychiatric: Pleasant. Cooperative.    Labs and Imaging on Admission: I have personally reviewed following labs and imaging studies  CBC: Recent Labs  Lab 01/12/22 1532 01/12/22 1830  WBC 10.1 9.4  NEUTROABS 4.3 3.7  HGB 5.5 Repeated and verified X2.* 5.4*  HCT 18.3 Repeated and verified X2.* 19.5*  MCV 70.6* 76.8*  PLT 394.0 656*   Basic Metabolic Panel: Recent Labs  Lab 01/12/22 1532 01/12/22 1830  NA 142 138  K 3.9 3.1*  CL 109 107  CO2 28 26  GLUCOSE 92 92  BUN 10 12  CREATININE 0.81 0.81  CALCIUM 8.1* 8.3*  GFR: Estimated Creatinine Clearance: 75.2 mL/min (by C-G formula based on SCr of 0.81 mg/dL). Liver Function Tests: Recent Labs  Lab 01/12/22 1532 01/12/22 1830  AST 11 14*  ALT 8 12  ALKPHOS 37* 38  BILITOT 0.3 0.4  PROT 5.8* 6.1*  ALBUMIN 2.8* 2.5*   Recent Labs  Lab 01/12/22 1830  LIPASE 54*   No results for input(s): AMMONIA in the last 168 hours. Coagulation Profile: Recent Labs  Lab 01/12/22 1830  INR 0.9   Cardiac Enzymes: No results for input(s): CKTOTAL, CKMB, CKMBINDEX, TROPONINI in the last 168 hours. BNP (last 3 results) No results for input(s): PROBNP in the last 8760 hours. HbA1C: No results for input(s): HGBA1C in the last 72 hours. CBG: No results for input(s): GLUCAP in the last 168 hours. Lipid Profile: No results for input(s): CHOL, HDL, LDLCALC, TRIG, CHOLHDL, LDLDIRECT in the last 72 hours. Thyroid Function Tests: No results for input(s): TSH, T4TOTAL, FREET4, T3FREE, THYROIDAB in the last 72 hours. Anemia Panel: No results for input(s): VITAMINB12, FOLATE, FERRITIN, TIBC, IRON, RETICCTPCT in the last 72 hours. Urine analysis:    Component Value Date/Time   COLORURINE YELLOW 01/12/2022 1844   APPEARANCEUR CLEAR 01/12/2022 1844   LABSPEC  1.024 01/12/2022 1844   PHURINE 5.0 01/12/2022 1844   GLUCOSEU NEGATIVE 01/12/2022 1844   HGBUR MODERATE (A) 01/12/2022 1844   BILIRUBINUR NEGATIVE 01/12/2022 1844   KETONESUR 5 (A) 01/12/2022 1844   PROTEINUR NEGATIVE 01/12/2022 1844   UROBILINOGEN 0.2 04/02/2013 1056   NITRITE NEGATIVE 01/12/2022 1844   LEUKOCYTESUR NEGATIVE 01/12/2022 1844   Sepsis Labs: @LABRCNTIP (procalcitonin:4,lacticidven:4) )No results found for this or any previous visit (from the past 240 hour(s)).   Radiological Exams on Admission: No results found.  Assessment/Plan   1. Symptomatic anemia; GI bleeding; ulcerative colitis  - Presents with Hgb 5.5 on outpatient labs, reports recent fatigue and resting tachycardia, 4 months of GI bleeding despite Humira and recent course of prednisone  - 2 units RBC ordered for transfusion from ED and GI was consulted by ED physician  - Check post-transfusion CBC, follow-up GI recommendations    2. Hypokalemia  - Replace potassium, repeat chem panel   3. Swelling - Pt reports recent bilateral lower leg swelling, also felt reported facial and hand swelling that seemed to resolve  - Hand and face swelling not noted on exam, no JVD, leg swelling could be d/t iron-deficiency and/or low albumin    DVT prophylaxis: SCDs  Code Status: Full  Level of Care: Level of care: Telemetry Family Communication: Husband at bedside  Disposition Plan:  Patient is from: home  Anticipated d/c is to: Home  Anticipated d/c date is: 1/27 or 01/14/22  Patient currently: Pending post-transfusion CBC, GI consultation  Consults called: GI  Admission status: Observation     Vianne Bulls, MD [facility]  01/12/2022, 9:35 PM

## 2022-01-12 NOTE — ED Provider Notes (Signed)
[facility] DEPT Provider Note  CSN: [id number] Arrival date & time: 01/12/22 1739  Chief Complaint(s) abnormal labs  HPI Gloria Green is a 31 y.o. female with PMH ulcerative colitis currently on Humira who presents the emergency department for evaluation of bright red blood per rectum.  Patient received outpatient lab work from gastroenterology who found the patient to have a low albumin and a hemoglobin of 5.5.  She was then encouraged to come the emergency department for transfusion and admission.  She states that she has had bright red blood per rectum for the last 4 months despite being on Humira.  Her additional primary concern is lower extremity swelling.  She states this is new for her.  Denies chest pain, shortness of breath, headache, fever or other systemic symptoms.  HPI  Past Medical History Past Medical History:  Diagnosis Date   Anal fissure    Anemia    Anxiety    Hemorrhoid    Ulcerative colitis ([place])    Patient Active Problem List   Diagnosis Date Noted   Symptomatic anemia 01/12/2022   GI bleeding 01/12/2022   Hypokalemia 01/12/2022   UC (ulcerative colitis) ([place]) 11/22/2021   Leiomyoma 06/16/2019   Home Medication(s) Prior to Admission medications   Medication Sig Start Date End Date Taking? Authorizing Provider  Adalimumab (HUMIRA PEN) 40 MG/0.4ML PNKT Inject 40 mg into the skin every 14 (fourteen) days. 11/14/21  Yes Nandigam, Venia Minks, MD  citalopram (CELEXA) 20 MG tablet Take 20 mg by mouth daily.   Yes [provider]  diphenoxylate-atropine (LOMOTIL) 2.5-0.025 MG tablet Take 1 tablet by mouth daily as needed for diarrhea or loose stools.   Yes [provider]  loperamide (IMODIUM) 2 MG capsule Take 2 mg by mouth daily as needed for diarrhea or loose stools.   Yes [provider]  ondansetron (ZOFRAN) 4 MG tablet Take 1 tablet (4 mg total) by mouth daily as needed for nausea or vomiting.  12/20/21  Yes Nandigam, Venia Minks, MD  dicyclomine (BENTYL) 20 MG tablet Take 1 tablet (20 mg total) by mouth 2 (two) times daily as needed for up to 5 days for spasms. Patient not taking: Reported on 01/12/2022 10/16/21 11/08/21  Jeanell Sparrow, DO  hydrocortisone-pramoxine [facility]) 2.5-1 % rectal cream Place 1 application rectally 2 (two) times daily for 10 days. Patient not taking: Reported on 01/12/2022 01/06/22 01/16/22  Mauri Pole, MD  mesalamine (CANASA) 1000 MG suppository Place 1 suppository (1,000 mg total) rectally at bedtime. Patient not taking: Reported on 01/12/2022 01/06/22   Mauri Pole, MD  predniSONE (DELTASONE) 10 MG tablet Take 30 mg twice a day for 1 week then decrease by 10 mg every week until 20 mg daily Patient not taking: Reported on 01/12/2022 11/04/21   Mauri Pole, MD  predniSONE (DELTASONE) 20 MG tablet 60 mg daily Patient not taking: Reported on 01/12/2022 11/04/21   Mauri Pole, MD  promethazine (PHENERGAN) 12.5 MG tablet Take 1 tablet (12.5 mg total) by mouth daily as needed for nausea or vomiting. Patient not taking: Reported on 01/12/2022 10/17/21   Mauri Pole, MD  sucralfate (CARAFATE) 1 g tablet Take 1 tablet (1 g total) by mouth 4 (four) times daily -  with meals and at bedtime for 5 days. Patient not taking: Reported on 01/12/2022 10/16/21 10/21/21  Jeanell Sparrow, DO  vancomycin (VANCOCIN) 125 MG capsule On day 11 start 1 capsule 3 times daily  x 3 days then twice daily x 3 days then once daily x 3 days then QOD x 3 days then d/c Patient not taking: Reported on 01/12/2022 11/14/21   Mauri Pole, MD                                                                                                                                    Past Surgical History Past Surgical History:  Procedure Laterality Date   CESAREAN SECTION  09/11/2021   COSMETIC SURGERY  12/18/2010   nose   MYOMECTOMY N/A 06/16/2019   Procedure:  ABDOMINAL MYOMECTOMY;  Surgeon: Molli Posey, MD;  Location: [facility];  Service: Gynecology;  Laterality: N/A;   WISDOM TOOTH EXTRACTION     Family History Family History  Problem Relation Age of Onset   Hypertension Mother    Obesity Father    Hypertension Brother    Lung cancer Maternal Grandmother    Lung cancer Maternal Grandfather     Social History Social History   Tobacco Use   Smoking status: Former    Types: Cigarettes    Quit date: 06/11/2017    Years since quitting: 4.5   Smokeless tobacco: Never  Vaping Use   Vaping Use: Former   Quit date: 10/11/2018  Substance Use Topics   Alcohol use: Not Currently    Comment: occ   Drug use: Never   Allergies Sulfa antibiotics, Latex, and Wound dressing adhesive  Review of Systems Review of Systems  Cardiovascular:  Positive for leg swelling.  Gastrointestinal:  Positive for blood in stool.   Physical Exam Vital Signs  I have reviewed the triage vital signs BP 128/77    Pulse (!) 112    Temp 98.2 F (36.8 C) (Oral)    Resp (!) 24    Ht 5' 1"  (1.549 m)    Wt 54.4 kg    SpO2 99%    Breastfeeding Yes    BMI 22.67 kg/m   Physical Exam Vitals and nursing note reviewed.  Constitutional:      General: She is not in acute distress.    Appearance: She is well-developed.  HENT:     Head: Normocephalic and atraumatic.  Eyes:     Conjunctiva/sclera: Conjunctivae normal.  Cardiovascular:     Rate and Rhythm: Normal rate and regular rhythm.     Heart sounds: No murmur heard. Pulmonary:     Effort: Pulmonary effort is normal. No respiratory distress.     Breath sounds: Normal breath sounds.  Abdominal:     Palpations: Abdomen is soft.     Tenderness: There is no abdominal tenderness.  Musculoskeletal:        General: No swelling.     Cervical back: Neck supple.     Right lower leg: Edema present.     Left lower leg: Edema present.  Skin:    General: Skin is warm and  dry.     Capillary Refill:  Capillary refill takes less than 2 seconds.  Neurological:     Mental Status: She is alert.  Psychiatric:        Mood and Affect: Mood normal.    ED Results and Treatments Labs (all labs ordered are listed, but only abnormal results are displayed) Labs Reviewed  COMPREHENSIVE METABOLIC PANEL - Abnormal; Notable for the following components:      Result Value   Potassium 3.1 (*)    Calcium 8.3 (*)    Total Protein 6.1 (*)    Albumin 2.5 (*)    AST 14 (*)    All other components within normal limits  CBC WITH DIFFERENTIAL/PLATELET - Abnormal; Notable for the following components:   RBC 2.54 (*)    Hemoglobin 5.4 (*)    HCT 19.5 (*)    MCV 76.8 (*)    MCH 21.3 (*)    MCHC 27.7 (*)    RDW 18.7 (*)    Platelets 445 (*)    nRBC 0.3 (*)    Eosinophils Absolute 1.2 (*)    Abs Immature Granulocytes 0.10 (*)    All other components within normal limits  LIPASE, BLOOD - Abnormal; Notable for the following components:   Lipase 54 (*)    All other components within normal limits  URINALYSIS, ROUTINE W REFLEX MICROSCOPIC - Abnormal; Notable for the following components:   Hgb urine dipstick MODERATE (*)    Ketones, ur 5 (*)    All other components within normal limits  RESP PANEL BY RT-PCR (FLU A&B, COVID) ARPGX2  PROTIME-INR  HIV ANTIBODY (ROUTINE TESTING W REFLEX)  BASIC METABOLIC PANEL  MAGNESIUM  C-REACTIVE PROTEIN  OCCULT BLOOD X 1 CARD TO LAB, STOOL  TYPE AND SCREEN  PREPARE RBC (CROSSMATCH)                                                                                                                          Radiology No results found.  Pertinent labs & imaging results that were available during my care of the patient were reviewed by me and considered in my medical decision making (see MDM for details).  Medications Ordered in ED Medications  sodium chloride flush (NS) 0.9 % injection 3 mL (3 mLs Intravenous Given 01/12/22 2125)  acetaminophen (TYLENOL) tablet 650  mg (has no administration in time range)    Or  acetaminophen (TYLENOL) suppository 650 mg (has no administration in time range)  ondansetron (ZOFRAN) tablet 4 mg (has no administration in time range)    Or  ondansetron (ZOFRAN) injection 4 mg (has no administration in time range)  0.9 % NaCl with KCl 20 mEq/ L  infusion (has no administration in time range)  ondansetron (ZOFRAN) injection 4 mg (4 mg Intravenous Given 01/12/22 1906)  0.9 %  sodium chloride infusion (Manually program via Guardrails IV Fluids) (10 mL/hr Intravenous New Bag/Given 01/12/22 2119)  potassium chloride SA (KLOR-CON M) CR  tablet 20 mEq (20 mEq Oral Given 01/12/22 2135)                                                                                                                                     Procedures .Critical Care Performed by: Teressa Lower, MD Authorized by: Teressa Lower, MD   Critical care provider statement:    Critical care time (minutes):  30   Critical care was necessary to treat or prevent imminent or life-threatening deterioration of the following conditions: critical anemia.   Critical care was time spent personally by me on the following activities:  Development of treatment plan with patient or surrogate, discussions with consultants, evaluation of patient's response to treatment, examination of patient, ordering and review of laboratory studies, ordering and review of radiographic studies, ordering and performing treatments and interventions, pulse oximetry, re-evaluation of patient's condition and review of old charts  (including critical care time)  Medical Decision Making / ED Course   This patient presents to the ED for concern of bright red blood per rectum, this involves an extensive number of treatment options, and is a complaint that carries with it a high risk of complications and morbidity.  The differential diagnosis includes UC flare, GI bleed, iron deficiency anemia, kidney  failure, liver failure, hypoalbuminemia  MDM: Patient seen emergency department for evaluation of bright red blood per rectum in the setting of the ulcerative colitis.  Physical exam reveals mild bilateral lower extremity pitting edema.  Laboratory evaluation with a mild lipase elevation 54, hypokalemia to 3.1, hemoglobin 5.4, MCV 76.8, albumin 2.5.  Suspect patient's lower extremity edema secondary to hypoalbuminemia.  She may have this low albumin level secondary to increased bowel motility and decreased absorption of protein.  Patient will be transfused 2 units packed red blood cells and admitted.  GI was consulted who recommended medical admission and they will round on the patient tomorrow morning.   Additional history obtained: -Additional history obtained from mother -External records from outside source obtained and reviewed including: Chart review including previous notes, labs, imaging, consultation notes   Lab Tests: -I ordered, reviewed, and interpreted labs.   The pertinent results include:   Labs Reviewed  COMPREHENSIVE METABOLIC PANEL - Abnormal; Notable for the following components:      Result Value   Potassium 3.1 (*)    Calcium 8.3 (*)    Total Protein 6.1 (*)    Albumin 2.5 (*)    AST 14 (*)    All other components within normal limits  CBC WITH DIFFERENTIAL/PLATELET - Abnormal; Notable for the following components:   RBC 2.54 (*)    Hemoglobin 5.4 (*)    HCT 19.5 (*)    MCV 76.8 (*)    MCH 21.3 (*)    MCHC 27.7 (*)    RDW 18.7 (*)    Platelets 445 (*)    nRBC 0.3 (*)  Eosinophils Absolute 1.2 (*)    Abs Immature Granulocytes 0.10 (*)    All other components within normal limits  LIPASE, BLOOD - Abnormal; Notable for the following components:   Lipase 54 (*)    All other components within normal limits  URINALYSIS, ROUTINE W REFLEX MICROSCOPIC - Abnormal; Notable for the following components:   Hgb urine dipstick MODERATE (*)    Ketones, ur 5 (*)    All  other components within normal limits  RESP PANEL BY RT-PCR (FLU A&B, COVID) ARPGX2  PROTIME-INR  HIV ANTIBODY (ROUTINE TESTING W REFLEX)  BASIC METABOLIC PANEL  MAGNESIUM  C-REACTIVE PROTEIN  OCCULT BLOOD X 1 CARD TO LAB, STOOL  TYPE AND SCREEN  PREPARE RBC (CROSSMATCH)      Medicines ordered and prescription drug management: Meds ordered this encounter  Medications   ondansetron (ZOFRAN) injection 4 mg   0.9 %  sodium chloride infusion (Manually program via Guardrails IV Fluids)   sodium chloride flush (NS) 0.9 % injection 3 mL   OR Linked Order Group    acetaminophen (TYLENOL) tablet 650 mg    acetaminophen (TYLENOL) suppository 650 mg   OR Linked Order Group    ondansetron (ZOFRAN) tablet 4 mg    ondansetron (ZOFRAN) injection 4 mg   0.9 % NaCl with KCl 20 mEq/ L  infusion   potassium chloride SA (KLOR-CON M) CR tablet 20 mEq   DISCONTD: 0.9 %  sodium chloride infusion    -I have reviewed the patients home medicines and have made adjustments as needed  Critical interventions Packed red blood cell transfusions  Consultations Obtained: I requested consultation with the GI physician,  and discussed lab and imaging findings as well as pertinent plan - they recommend: medical admission   Cardiac Monitoring: The patient was maintained on a cardiac monitor.  I personally viewed and interpreted the cardiac monitored which showed an underlying rhythm of: NSR  Social Determinants of Health:  Factors impacting patients care include: none   Reevaluation: After the interventions noted above, I reevaluated the patient and found that they have :stayed the same  Co morbidities that complicate the patient evaluation  Past Medical History:  Diagnosis Date   Anal fissure    Anemia    Anxiety    Hemorrhoid    Ulcerative colitis ([place])       Dispostion: I considered admission for this patient, due to her critical anemia she was admitted.     Final Clinical  Impression(s) / ED Diagnoses Final diagnoses:  None     @PCDICTATION @    Teressa Lower, MD 01/12/22 2300

## 2022-01-13 ENCOUNTER — Observation Stay (HOSPITAL_COMMUNITY): Payer: BC Managed Care – PPO

## 2022-01-13 ENCOUNTER — Telehealth: Payer: Self-pay | Admitting: Gastroenterology

## 2022-01-13 DIAGNOSIS — D649 Anemia, unspecified: Secondary | ICD-10-CM | POA: Diagnosis not present

## 2022-01-13 DIAGNOSIS — K51011 Ulcerative (chronic) pancolitis with rectal bleeding: Secondary | ICD-10-CM | POA: Diagnosis not present

## 2022-01-13 LAB — MAGNESIUM: Magnesium: 1.4 mg/dL — ABNORMAL LOW (ref 1.7–2.4)

## 2022-01-13 LAB — C-REACTIVE PROTEIN: CRP: 0.8 mg/dL (ref <1.0)

## 2022-01-13 LAB — CBC WITH DIFFERENTIAL/PLATELET
Abs Immature Granulocytes: 0.31 10*3/uL — ABNORMAL HIGH (ref 0.00–0.07)
Basophils Absolute: 0.1 10*3/uL (ref 0.0–0.1)
Basophils Relative: 1 %
Eosinophils Absolute: 1.4 10*3/uL — ABNORMAL HIGH (ref 0.0–0.5)
Eosinophils Relative: 12 %
HCT: 32 % — ABNORMAL LOW (ref 36.0–46.0)
Hemoglobin: 10.2 g/dL — ABNORMAL LOW (ref 12.0–15.0)
Immature Granulocytes: 3 %
Lymphocytes Relative: 37 %
Lymphs Abs: 4.1 10*3/uL — ABNORMAL HIGH (ref 0.7–4.0)
MCH: 26 pg (ref 26.0–34.0)
MCHC: 31.9 g/dL (ref 30.0–36.0)
MCV: 81.6 fL (ref 80.0–100.0)
Monocytes Absolute: 1 10*3/uL (ref 0.1–1.0)
Monocytes Relative: 9 %
Neutro Abs: 4.3 10*3/uL (ref 1.7–7.7)
Neutrophils Relative %: 38 %
Platelets: 294 10*3/uL (ref 150–400)
RBC: 3.92 MIL/uL (ref 3.87–5.11)
RDW: 17.5 % — ABNORMAL HIGH (ref 11.5–15.5)
WBC: 11.1 10*3/uL — ABNORMAL HIGH (ref 4.0–10.5)
nRBC: 0.6 % — ABNORMAL HIGH (ref 0.0–0.2)

## 2022-01-13 LAB — BASIC METABOLIC PANEL
Anion gap: 4 — ABNORMAL LOW (ref 5–15)
BUN: 9 mg/dL (ref 6–20)
CO2: 22 mmol/L (ref 22–32)
Calcium: 7.3 mg/dL — ABNORMAL LOW (ref 8.9–10.3)
Chloride: 112 mmol/L — ABNORMAL HIGH (ref 98–111)
Creatinine, Ser: 0.61 mg/dL (ref 0.44–1.00)
GFR, Estimated: >60 mL/min (ref >60)
Glucose, Bld: 76 mg/dL (ref 70–99)
Potassium: 3.6 mmol/L (ref 3.5–5.1)
Sodium: 138 mmol/L (ref 135–145)

## 2022-01-13 LAB — C DIFFICILE QUICK SCREEN W PCR REFLEX
C Diff antigen: NEGATIVE
C Diff interpretation: NOT DETECTED
C Diff toxin: NEGATIVE

## 2022-01-13 LAB — POC OCCULT BLOOD, ED: Fecal Occult Bld: POSITIVE — AB

## 2022-01-13 LAB — HIV ANTIBODY (ROUTINE TESTING W REFLEX): HIV Screen 4th Generation wRfx: NONREACTIVE

## 2022-01-13 LAB — HCG, QUANTITATIVE, PREGNANCY: hCG, Beta Chain, Quant, S: <1 m[IU]/mL (ref <5)

## 2022-01-13 LAB — PREALBUMIN: PREALBUMIN: 13 mg/dL — ABNORMAL LOW (ref 14–35)

## 2022-01-13 MED ORDER — IOHEXOL 9 MG/ML PO SOLN
1000.0000 mL | ORAL | Status: AC
  Administered 2022-01-13: 1000 mL via ORAL

## 2022-01-13 MED ORDER — IOHEXOL 300 MG/ML  SOLN
100.0000 mL | Freq: Once | INTRAMUSCULAR | Status: AC | PRN
  Administered 2022-01-13: 100 mL via INTRAVENOUS

## 2022-01-13 MED ORDER — DEXTROSE-NACL 5-0.45 % IV SOLN: INTRAVENOUS | Status: DC

## 2022-01-13 MED ORDER — LORAZEPAM 0.5 MG PO TABS
0.5000 mg | ORAL_TABLET | Freq: Four times a day (QID) | ORAL | Status: DC | PRN
  Administered 2022-01-13 – 2022-01-19 (×6): 0.5 mg via ORAL
  Filled 2022-01-13 (×6): qty 1

## 2022-01-13 MED ORDER — CITALOPRAM HYDROBROMIDE 20 MG PO TABS
20.0000 mg | ORAL_TABLET | Freq: Every day | ORAL | Status: DC
  Administered 2022-01-13 – 2022-01-19 (×7): 20 mg via ORAL
  Filled 2022-01-13 (×7): qty 1

## 2022-01-13 MED ORDER — LORAZEPAM 0.5 MG PO TABS
0.5000 mg | ORAL_TABLET | Freq: Once | ORAL | Status: AC | PRN
  Administered 2022-01-13: 0.5 mg via ORAL
  Filled 2022-01-13: qty 1

## 2022-01-13 MED ORDER — SODIUM CHLORIDE 0.9 % IV SOLN: INTRAVENOUS | Status: DC

## 2022-01-13 MED ORDER — METHYLPREDNISOLONE SODIUM SUCC 40 MG IJ SOLR
30.0000 mg | Freq: Two times a day (BID) | INTRAMUSCULAR | Status: AC
  Administered 2022-01-13 – 2022-01-17 (×9): 30 mg via INTRAVENOUS
  Filled 2022-01-13 (×9): qty 1

## 2022-01-13 MED ORDER — METHYLPREDNISOLONE SODIUM SUCC 125 MG IJ SOLR: 60.0000 mg | Freq: Two times a day (BID) | INTRAMUSCULAR | Status: DC

## 2022-01-13 MED ORDER — MAGNESIUM SULFATE 4 GM/100ML IV SOLN
4.0000 g | Freq: Once | INTRAVENOUS | Status: AC
  Administered 2022-01-13: 4 g via INTRAVENOUS
  Filled 2022-01-13: qty 100

## 2022-01-13 MED ORDER — IOHEXOL 9 MG/ML PO SOLN
ORAL | Status: AC
  Filled 2022-01-13: qty 1000

## 2022-01-13 NOTE — Telephone Encounter (Signed)
[facility], she is in the ER.  She is awaiting further work-up for severe anemia, melena and rectal bleeding. Hemoglobin 5.5, improved to 10 s/p 4 unit PRBC transfusion  She was due for Humira injection yesterday, will request inpatient pharmacy if patient can self administer her home medication or we will have delay Humira injection until she is discharged from the hospital  K. Denzil Magnuson , MD [phone]

## 2022-01-13 NOTE — ED Notes (Signed)
Pt to CT

## 2022-01-13 NOTE — Progress Notes (Signed)
PROGRESS NOTE  Gloria Green NUU:[id number] DOB: 1991-11-18 DOA: 01/12/2022 PCP: Alroy Dust, L.Marlou Sa, MD  Brief History   Gloria Green is a pleasant 31 y.o. female with medical history significant for ulcerative colitis on Humira, now presenting to the emergency department for evaluation of low hemoglobin on outpatient labs, fatigue, tachycardia, and swelling.  The patient reports approximately 4 months of GI bleeding and approximately 2 weeks of increased fatigue and resting tachycardia.  She has had bright red blood per rectum for the past 4 months but has also had some very dark stool over the past couple days.  She denies any recent fevers or chills and reports that her chronic abdominal discomfort has not changed recently.  She had been on prednisone but completed a taper within the past couple weeks.  She reports recent bilateral lower leg swelling and also felt that her hands and face may have been swollen this morning.  She denies vomiting.  She called her GI clinic with these complaints, blood work was obtained, and she was directed to the ED for evaluation of hemoglobin 5.5.   ED Course: Upon arrival to the ED, patient is found to be afebrile, saturating well on room air, tachycardic to 110s, and with stable blood pressure.  CBC notable for hemoglobin 5.4 with MCV 76.8.  Chemistry panel features and albumin of 2.5 and potassium 3.1.  GI was consulted by the ED physician and 2 units RBC were ordered for transfusion.  [facility] were consulted to admit the patient for further evaluation and care. GI is to evaluate her later today.  Consultants  Gastroenterology  Procedures    Antibiotics   Anti-infectives (From admission, onward)    None      Subjective  The patient is resting comfortably. No new complaints.  Objective   Vitals:  Vitals:   01/13/22 1656 01/13/22 1716  BP: 117/75 124/84  Pulse: 84 81  Resp: 18 20  Temp: 98.6 F (37 C) 99 F (37.2 C)   SpO2: 99% 100%    Exam:  Constitutional:  Appears calm and comfortable Eyes:  pupils and irises appear normal Normal lids and conjunctivae ENMT:  grossly normal hearing  Lips appear normal external ears, nose appear normal Oropharynx: mucosa, tongue,posterior pharynx appear normal Neck:  neck appears normal, no masses, normal ROM, supple no thyromegaly Respiratory:  CTA bilaterally, no w/r/r.  Respiratory effort normal. No retractions or accessory muscle use Cardiovascular:  RRR, no m/r/g No LE extremity edema   Normal pedal pulses Abdomen:  Abdomen appears normal; no tenderness or masses No hernias No HSM Musculoskeletal:  Digits/nails BUE: no clubbing, cyanosis, petechiae, infection exam of joints, bones, muscles of at least one of following: head/neck, RUE, LUE, RLE, LLE   strength and tone normal, no atrophy, no abnormal movements No tenderness, masses Normal ROM, no contractures  gait and station Skin:  No rashes, lesions, ulcers palpation of skin: no induration or nodules Neurologic:  CN 2-12 intact Sensation all 4 extremities intact Psychiatric:  Mental status Mood, affect appropriate Orientation to person, place, time  judgment and insight appear intact     I have personally reviewed the following:   Today's Data  Vitals  Lab Data  CBC BMP CRP  Micro Data    Imaging  CT abdomen and pelvis  Cardiology Data    Other Data    Scheduled Meds:  citalopram  20 mg Oral QHS   methylPREDNISolone (SOLU-MEDROL) injection  30 mg Intravenous Q12H  sodium chloride flush  3 mL Intravenous Q12H   Continuous Infusions:  dextrose 5 % and 0.45% NaCl 75 mL/hr at 01/13/22 1742    Principal Problem:   Symptomatic anemia Active Problems:   UC (ulcerative colitis) ([facility])   GI bleeding   Hypokalemia   LOS: 0 days   A & P  1. Symptomatic anemia; GI bleeding; ulcerative colitis  - Presents with Hgb 5.5 on outpatient labs, reports recent fatigue  and resting tachycardia, 4 months of GI bleeding despite Humira and recent course of prednisone  - 2 units RBC ordered for transfusion from ED and GI was consulted by ED physician  - Check post-transfusion CBC, follow-up GI recommendations     2. Hypokalemia  - Replace potassium, repeat chem panel    3. Swelling - Pt reports recent bilateral lower leg swelling, also felt reported facial and hand swelling that seemed to resolve  - Hand and face swelling not noted on exam, no JVD, leg swelling could be d/t iron-deficiency and/or low albumin   I have seen and examined this patient myself. I have spent 34 minutes in her evaluation and care.   DVT prophylaxis: SCDs  Code Status: Full  Level of Care: Level of care: Telemetry Family Communication: Husband at bedside  Disposition Plan:  Patient is from: home  Anticipated d/c is to: Home  Anticipated d/c date is: 1/27 or 01/14/22    Lizzete Gough, DO [facility] Direct contact: see [email or web address]  7PM-7AM contact night coverage as above 01/13/2022, 2:24 PM  LOS: 0 days

## 2022-01-13 NOTE — Consult Note (Addendum)
Referring Provider: Dr. Karie Kirks  Primary Care Physician:  Alroy Dust, Carlean Jews.Marlou Sa, MD Primary Gastroenterologist:  Dr.Nandigam   Reason for Consultation: Anemia, dark stool, rectal bleeding   HPI: Gloria Green is a 31 y.o. female with a past medical history of C. Diff infection treated with Vancomycin and ulcerative colitis initially diagnosed in Nov. 2022 on Humira.   She was initially evaluated in our office by Dr. Silverio Decamp 07/2021 due to having chronic diarrhea (10- 20 episodes daily) with rectal bleeding. She [redacted] weeks gestation at that time. CRP level was elevated. CTAP 10/16/2021 showed colitis to the transverse, descending, sigmoid colon and rectum. She was initially prescribed Prednisone 28m QD. She had worsening diarrhea and C. Diff PCR testing 11/1/202  was positive treated with Vancomycin with a prolonged taper and Prednisone was continued.  She underwent a colonoscopy 2 months post partum on 11/03/2021 which showed active chronic proctocolitis. A benign appearing intrinsic mild stenosis was found in the sigmoid colon associated with significant mucosal erythema, ulceration and friability which was non-traversed. She was prescribed Humira which was started Dec. 2022. Her last dose of Humira was 12/29/2021. She gradually weaned off Prednisone 2 weeks ago.   She continued to have at least 12 episodes of brown watery diarrhea daily with bright red blood most days. She contacted Dr. NSilverio Decampwith complaints of increased rectal bleeding around 12/20/2021 and she was prescribed Anusol suppositories which were not covered by her insurance. Mesalamine suppositories were ordered but she did not start this treatment. Earlier this week, she awakened at night and passed dark, almost black pellet like stools every other night x 2 nights. During the daytime, she passed her typical diarrhea with less red blood 10 to 12 times. She continued to take Imodium 2 tabs bid and Lomotil 2 tabs at night time. She  continued to have generalized abdominal pain/cramping which abates after she passes a diarrhea bowel movement.  She has noticed her heart rate runs in the 110's - 120s over the past month and a few times she felt near faint.  She noticed swelling to her face, hands, ankles and feet yesterday and she contacted Dr. NSilverio Decamp She was sent to our lab and her  CBC showed profound anemia with a hemoglobin level of 5.5 (Hg 11.1 on 11/22/202).  Hematocrit 18.3.  MCV 70.6.  Platelet 394.  CRP 10.060.  BUN 10.  Creatinine 0.81.  Alk phos 37.  Platelet 0.3.  AST 11.  ALT 8.  [facility] and [facility].  Labs in the ED showed a Hg level of 5.4. BUN 12. K+ 3.1.  INR 0.9.  SARS coronavirus 2 negative. Influenza A and B negative.  Moderate urine hemoglobin.  She was transfused 4 units of PRBCs.  Posttransfusion hemoglobin level pending.  No NSAID use.  Rare alcohol use.  Limited caffeine intake.  No Pepto-Bismol or iron use.  She has nausea the past few months without vomiting.  No dysphagia or heartburn.  No fever, sweats or chills.  No menstrual cycle since her baby was delivered 09/11/2021.  Colonoscopy 11/03/2021 by Dr. NSilverio Decamp - The perianal and digital rectal examinations were normal. - A benign-appearing, intrinsic mild stenosis was found in the sigmoid colon associated with significant mucosal erythema, ulceration and friability, was non-traversed. Biopsies were taken with a cold forceps for histology. - A continuous area of bleeding ulcerated mucosa with no stigmata of recent bleeding was present in the rectum,  in the recto-sigmoid colon and in the sigmoid colon. Biopsies were taken with a cold forceps for histology. - Non-bleeding external and internal hemorrhoids were found during retroflexion. The hemorrhoids were small. -Repeat colonoscopy in 1 year - [place], SEE NOTE - NEGATIVE FOR GRANULOMAS OR  DYSPLASIA  CTAP with contrast 10/17/2019: 1. Colitis involving the transverse, descending and sigmoid colon and rectum. No evidence of bowel obstruction, focal collection or pneumoperitoneum. 2. 3 mm nonobstructing RIGHT LOWER pole renal calculus. 3. 1.5 x 2 cm RIGHT adrenal adenoma.  Past Medical History:  Diagnosis Date   Anal fissure    Anemia    Anxiety    Hemorrhoid    Ulcerative colitis [facility])     Past Surgical History:  Procedure Laterality Date   CESAREAN SECTION  09/11/2021   COSMETIC SURGERY  12/18/2010   nose   MYOMECTOMY N/A 06/16/2019   Procedure: ABDOMINAL MYOMECTOMY;  Surgeon: Molli Posey, MD;  Location: [facility];  Service: Gynecology;  Laterality: N/A;   WISDOM TOOTH EXTRACTION      Prior to Admission medications   Medication Sig Start Date End Date Taking? Authorizing Provider  Adalimumab (HUMIRA PEN) 40 MG/0.4ML PNKT Inject 40 mg into the skin every 14 (fourteen) days. 11/14/21  Yes Nandigam, Venia Minks, MD  citalopram (CELEXA) 20 MG tablet Take 20 mg by mouth daily.   Yes [provider]  diphenoxylate-atropine (LOMOTIL) 2.5-0.025 MG tablet Take 1 tablet by mouth daily as needed for diarrhea or loose stools.   Yes [provider]  loperamide (IMODIUM) 2 MG capsule Take 2 mg by mouth daily as needed for diarrhea or loose stools.   Yes [provider]  ondansetron (ZOFRAN) 4 MG tablet Take 1 tablet (4 mg total) by mouth daily as needed for nausea or vomiting. 12/20/21  Yes Nandigam, Venia Minks, MD  dicyclomine (BENTYL) 20 MG tablet Take 1 tablet (20 mg total) by mouth 2 (two) times daily as needed for up to 5 days for spasms. Patient not taking: Reported on 01/12/2022 10/16/21 11/08/21  Jeanell Sparrow, DO  hydrocortisone-pramoxine [facility]) 2.5-1 % rectal cream Place 1 application rectally 2 (two) times daily for 10 days. Patient not taking: Reported on 01/12/2022 01/06/22 01/16/22  Mauri Pole, MD   mesalamine (CANASA) 1000 MG suppository Place 1 suppository (1,000 mg total) rectally at bedtime. Patient not taking: Reported on 01/12/2022 01/06/22   Mauri Pole, MD  predniSONE (DELTASONE) 10 MG tablet Take 30 mg twice a day for 1 week then decrease by 10 mg every week until 20 mg daily Patient not taking: Reported on 01/12/2022 11/04/21   Mauri Pole, MD  predniSONE (DELTASONE) 20 MG tablet 60 mg daily Patient not taking: Reported on 01/12/2022 11/04/21   Mauri Pole, MD  promethazine (PHENERGAN) 12.5 MG tablet Take 1 tablet (12.5 mg total) by mouth daily as needed for nausea or vomiting. Patient not taking: Reported on 01/12/2022 10/17/21   Mauri Pole, MD  sucralfate (CARAFATE) 1 g tablet Take 1 tablet (1 g total) by mouth 4 (four) times daily -  with meals and at bedtime for 5 days. Patient not taking: Reported on 01/12/2022 10/16/21 10/21/21  Jeanell Sparrow, DO  vancomycin (VANCOCIN) 125 MG capsule On day 11 start 1 capsule 3 times daily x 3 days then twice daily x 3 days then once daily x 3 days then QOD x 3 days then d/c Patient not taking: Reported on 01/12/2022  11/14/21   Mauri Pole, MD    Current Facility-Administered Medications  Medication Dose Route Frequency Provider Last Rate Last Admin   acetaminophen (TYLENOL) tablet 650 mg  650 mg Oral Q6H PRN Opyd, Ilene Qua, MD       Or   acetaminophen (TYLENOL) suppository 650 mg  650 mg Rectal Q6H PRN Opyd, Ilene Qua, MD       ondansetron (ZOFRAN) tablet 4 mg  4 mg Oral Q6H PRN Opyd, Ilene Qua, MD       Or   ondansetron (ZOFRAN) injection 4 mg  4 mg Intravenous Q6H PRN Opyd, Ilene Qua, MD       sodium chloride flush (NS) 0.9 % injection 3 mL  3 mL Intravenous Q12H Opyd, Ilene Qua, MD   3 mL at 01/12/22 2125   Current Outpatient Medications  Medication Sig Dispense Refill   Adalimumab (HUMIRA PEN) 40 MG/0.4ML PNKT Inject 40 mg into the skin every 14 (fourteen) days. 2 each 11   citalopram (CELEXA)  20 MG tablet Take 20 mg by mouth daily.     diphenoxylate-atropine (LOMOTIL) 2.5-0.025 MG tablet Take 1 tablet by mouth daily as needed for diarrhea or loose stools.     loperamide (IMODIUM) 2 MG capsule Take 2 mg by mouth daily as needed for diarrhea or loose stools.     ondansetron (ZOFRAN) 4 MG tablet Take 1 tablet (4 mg total) by mouth daily as needed for nausea or vomiting. 30 tablet 0   dicyclomine (BENTYL) 20 MG tablet Take 1 tablet (20 mg total) by mouth 2 (two) times daily as needed for up to 5 days for spasms. (Patient not taking: Reported on 01/12/2022) 20 tablet 0   hydrocortisone-pramoxine (ANALPRAM HC) 2.5-1 % rectal cream Place 1 application rectally 2 (two) times daily for 10 days. (Patient not taking: Reported on 01/12/2022) 20 g 0   mesalamine (CANASA) 1000 MG suppository Place 1 suppository (1,000 mg total) rectally at bedtime. (Patient not taking: Reported on 01/12/2022) 30 suppository 1   predniSONE (DELTASONE) 10 MG tablet Take 30 mg twice a day for 1 week then decrease by 10 mg every week until 20 mg daily (Patient not taking: Reported on 01/12/2022) 100 tablet 0   predniSONE (DELTASONE) 20 MG tablet 60 mg daily (Patient not taking: Reported on 01/12/2022) 250 tablet 1   promethazine (PHENERGAN) 12.5 MG tablet Take 1 tablet (12.5 mg total) by mouth daily as needed for nausea or vomiting. (Patient not taking: Reported on 01/12/2022) 30 tablet 0   sucralfate (CARAFATE) 1 g tablet Take 1 tablet (1 g total) by mouth 4 (four) times daily -  with meals and at bedtime for 5 days. (Patient not taking: Reported on 01/12/2022) 20 tablet 0   vancomycin (VANCOCIN) 125 MG capsule On day 11 start 1 capsule 3 times daily x 3 days then twice daily x 3 days then once daily x 3 days then QOD x 3 days then d/c (Patient not taking: Reported on 01/12/2022) 21 capsule 0    Allergies as of 01/12/2022 - Review Complete 01/12/2022  Allergen Reaction Noted   Sulfa antibiotics Hives 04/02/2013   Latex Rash  06/09/2019   Wound dressing adhesive Rash 10/16/2021    Family History  Problem Relation Age of Onset   Hypertension Mother    Obesity Father    Hypertension Brother    Lung cancer Maternal Grandmother    Lung cancer Maternal Grandfather     Social History   Socioeconomic  History   Marital status: Married    Spouse name: Not on file   Number of children: 2   Years of education: Not on file   Highest education level: Not on file  Occupational History   Occupation: Teacher  Tobacco Use   Smoking status: Former    Types: Cigarettes    Quit date: 06/11/2017    Years since quitting: 4.5   Smokeless tobacco: Never  Vaping Use   Vaping Use: Former   Quit date: 10/11/2018  Substance and Sexual Activity   Alcohol use: Not Currently    Comment: occ   Drug use: Never   Sexual activity: Not on file  Other Topics Concern   Not on file  Social History Narrative   Not on file   Social Determinants of Health   Financial Resource Strain: Not on file  Food Insecurity: Not on file  Transportation Needs: Not on file  Physical Activity: Not on file  Stress: Not on file  Social Connections: Not on file  Intimate Partner Violence: Not on file    Review of Systems: Gen: Denies fever, sweats or chills. No weight loss.  CV: No chest pain.  See HPI.   Resp: Denies cough, shortness of breath of hemoptysis.  GI: See HPI. GU : Denies urinary burning, blood in urine, increased urinary frequency or incontinence. MS: Denies joint pain, muscles aches or weakness. Derm: Denies rash, itchiness, skin lesions or unhealing ulcers. Psych: Denies depression, anxiety memory loss. Heme: Denies easy bruising, bleeding. Neuro:  Denies headaches, dizziness or paresthesias. Endo:  Denies any problems with DM, thyroid or adrenal function.  Physical Exam: Vital signs in last 24 hours: Temp:  [97 F (36.1 C)-98.3 F (36.8 C)] 98.1 F (36.7 C) (01/27 0700) Pulse Rate:  [65-117] 72 (01/27  0700) Resp:  [11-28] 22 (01/27 0700) BP: (101-131)/(59-93) 113/83 (01/27 0700) SpO2:  [97 %-100 %] 99 % (01/27 0700) Weight:  [54.4 kg] 54.4 kg (01/26 2000)   General:  Alert 31 year old female no acute distress. Head:  Normocephalic and atraumatic. Eyes:  No scleral icterus. Conjunctiva pink. Ears:  Normal auditory acuity. Nose:  No deformity, discharge or lesions. Mouth:  Dentition intact. No ulcers or lesions.  Neck:  Supple. No lymphadenopathy or thyromegaly.  Lungs: Breath sounds clear throughout. Heart: Regular rhythm, no murmurs. Abdomen:  Soft, nondistended.  Minimal tenderness to the RLQ and central lower abdomen without rebound or guarding.  Positive bowel sounds all 4 quadrants. Rectal: Deferred. Musculoskeletal:  Symmetrical without gross deformities.  Pulses:  Normal pulses noted. Extremities:  Without clubbing or edema. Neurologic:  Alert and  oriented x4. No focal deficits.  Skin:  Intact without significant lesions or rashes. Psych:  Alert and cooperative. Normal mood and affect.  Intake/Output from previous day: 01/26 0701 - 01/27 0700 In: 1580.5 [I.V.:3; Blood:1577.5] Out: -  Intake/Output this shift: Total I/O In: 30 [I.V.:30] Out: -   Lab Results: Recent Labs    01/12/22 1532 01/12/22 1830  WBC 10.1 9.4  HGB 5.5 Repeated and verified X2.* 5.4*  HCT 18.3 Repeated and verified X2.* 19.5*  PLT 394.0 445*   BMET Recent Labs    01/12/22 1532 01/12/22 1830  NA 142 138  K 3.9 3.1*  CL 109 107  CO2 28 26  GLUCOSE 92 92  BUN 10 12  CREATININE 0.81 0.81  CALCIUM 8.1* 8.3*   LFT Recent Labs    01/12/22 1830  PROT 6.1*  ALBUMIN 2.5*  AST 14*  ALT 12  ALKPHOS 38  BILITOT 0.4   PT/INR Recent Labs    01/12/22 1830  LABPROT 12.6  INR 0.9   Hepatitis Panel No results for input(s): HEPBSAG, HCVAB, HEPAIGM, HEPBIGM in the last 72 hours.    Studies/Results: No results found.  IMPRESSION/PLAN:  62) 31 year old female with ulcerative  colitis initially diagnosed per colonoscopy 11/03/2021, this colonoscopy was limited due to a benign-appearing, intrinsic mild stenosis in the sigmoid colon associated with significant mucosal erythema, ulceration and friability which was non-traversed. Started on Humira 11/2021, last injection was on 12/29/2021 . She was admitted to the hospital 01/12/2022 with chronic diarrhea, dark pellet like stools x 2 nights, bright red rectal bleeding with profound anemia. Hg 5.5 (base line Hg 11.1 on 11/08/2021). Transfused 4 units of PRBCs. Post transfusion H/H pending.  She is hemodynamically stable. -NPO -IVF per the hospitalist  -Ondansetron 4 mg p.o. or IV showers. -Pantoprazole 40 mg IV daily -Await post transfusion H/H -Monitor H/H closely -Transfuse for Hg < 7 -EGD +/- Flex sig vs full colonoscopy, await recommendations per Dr. Ardis Hughs -Check Beta HCG level  -Check C. Diff PCR  2) Swelling to face, hands and lower extremities secondary to hypoalbuminemia +/- prolonged course of Prednisone which was completed 2 weeks ago.  3) History of C. Diff 10/2021 treated with a 14 day course of Vancomycin with a prolonged taper    Noralyn Pick  01/13/2022, 09:12AM   _______________________________________________________________________________________________________________________________________  Velora Heckler GI MD note:  I personally examined the patient, reviewed the data and agree with the assessment and plan described above. She has severe, extensive UC with 10-20 bloody BMs daily for months. Also had some dark pebbles of stool twice in the past 2-3 days. Really unimpressive abd pains. No vomiting. No hematemesis. She does not take NSAIDs. She's been on prednisone and was supposed to take her third humira dosing yesterday (took week 0, week 2, and is now due for week 4).  Her colon is very, very sick and I am planning an EGD tomorrow to make sure she doesn't also have an upper GI source given  the dark pebbles of stool twice lately.  Her Hb improved from 5.4 to 10.2 after 4 unit blood transfusion but she is going to continue to loose blood at a fairly brisk pace until we quite her colitis substantially.   We are starting solumedrol 14m IV BID now. She is going to take her humira today (husband is getting it from home). We will check stool testing for comorbid infections. We are also ordering a CT scan abd/pelvis to check status of her colitis (last exam 3 months ago showed extensive colitis changes).  She understands that she will probably be inpatient until her bloody diarrhea is much improved.  If Iv steroids does not accomplish that within 3-4 days or so then we'll need to consider changing to IV remicade as it may be better in such acutely ill, malnourished patients (low alb).  DOwens Loffler MD [facility] Pager [phone]

## 2022-01-13 NOTE — ED Notes (Signed)
Occult blood card was not collected by previous shift, so previous nurse rescheduled to this am. Will collect when given opportunity.

## 2022-01-13 NOTE — Plan of Care (Signed)
Procedure tomorrow 01/14/2022, NPO at midnight

## 2022-01-13 NOTE — Telephone Encounter (Signed)
Thanks Glendell Docker

## 2022-01-14 ENCOUNTER — Observation Stay (HOSPITAL_COMMUNITY): Payer: BC Managed Care – PPO | Admitting: Anesthesiology

## 2022-01-14 ENCOUNTER — Encounter (HOSPITAL_COMMUNITY)
Admission: EM | Disposition: A | Discharge status: Home / Self Care | Payer: Self-pay | Source: Home / Self Care | Attending: Internal Medicine

## 2022-01-14 ENCOUNTER — Encounter (HOSPITAL_COMMUNITY): Payer: Self-pay | Admitting: Family Medicine

## 2022-01-14 DIAGNOSIS — K921 Melena: Secondary | ICD-10-CM | POA: Diagnosis not present

## 2022-01-14 DIAGNOSIS — D649 Anemia, unspecified: Secondary | ICD-10-CM | POA: Diagnosis not present

## 2022-01-14 HISTORY — PX: ESOPHAGOGASTRODUODENOSCOPY (EGD) WITH PROPOFOL: SHX5813

## 2022-01-14 HISTORY — PX: FLEXIBLE SIGMOIDOSCOPY: SHX5431

## 2022-01-14 HISTORY — PX: BIOPSY: SHX5522

## 2022-01-14 LAB — CBC WITH DIFFERENTIAL/PLATELET
Abs Immature Granulocytes: 0.32 10*3/uL — ABNORMAL HIGH (ref 0.00–0.07)
Basophils Absolute: 0.1 10*3/uL (ref 0.0–0.1)
Basophils Relative: 1 %
Eosinophils Absolute: 0.3 10*3/uL (ref 0.0–0.5)
Eosinophils Relative: 2 %
HCT: 35 % — ABNORMAL LOW (ref 36.0–46.0)
Hemoglobin: 11 g/dL — ABNORMAL LOW (ref 12.0–15.0)
Immature Granulocytes: 2 %
Lymphocytes Relative: 25 %
Lymphs Abs: 3.7 10*3/uL (ref 0.7–4.0)
MCH: 26.1 pg (ref 26.0–34.0)
MCHC: 31.4 g/dL (ref 30.0–36.0)
MCV: 83.1 fL (ref 80.0–100.0)
Monocytes Absolute: 1.3 10*3/uL — ABNORMAL HIGH (ref 0.1–1.0)
Monocytes Relative: 9 %
Neutro Abs: 9 10*3/uL — ABNORMAL HIGH (ref 1.7–7.7)
Neutrophils Relative %: 61 %
Platelets: 366 10*3/uL (ref 150–400)
RBC: 4.21 MIL/uL (ref 3.87–5.11)
RDW: 18.7 % — ABNORMAL HIGH (ref 11.5–15.5)
WBC: 14.7 10*3/uL — ABNORMAL HIGH (ref 4.0–10.5)
nRBC: 0.2 % (ref 0.0–0.2)

## 2022-01-14 LAB — BASIC METABOLIC PANEL
Anion gap: 5 (ref 5–15)
BUN: 9 mg/dL (ref 6–20)
CO2: 23 mmol/L (ref 22–32)
Calcium: 7.7 mg/dL — ABNORMAL LOW (ref 8.9–10.3)
Chloride: 107 mmol/L (ref 98–111)
Creatinine, Ser: 0.5 mg/dL (ref 0.44–1.00)
GFR, Estimated: >60 mL/min (ref >60)
Glucose, Bld: 111 mg/dL — ABNORMAL HIGH (ref 70–99)
Potassium: 3.9 mmol/L (ref 3.5–5.1)
Sodium: 135 mmol/L (ref 135–145)

## 2022-01-14 LAB — MAGNESIUM: Magnesium: 2.4 mg/dL (ref 1.7–2.4)

## 2022-01-14 SURGERY — ESOPHAGOGASTRODUODENOSCOPY (EGD) WITH PROPOFOL
Anesthesia: Monitor Anesthesia Care

## 2022-01-14 MED ORDER — LACTATED RINGERS IV SOLN: INTRAVENOUS | Status: DC | PRN

## 2022-01-14 MED ORDER — PROPOFOL 500 MG/50ML IV EMUL
INTRAVENOUS | Status: DC | PRN
  Administered 2022-01-14: 80 mg via INTRAVENOUS
  Administered 2022-01-14: 100 ug/kg/min via INTRAVENOUS

## 2022-01-14 SURGICAL SUPPLY — 15 items

## 2022-01-14 NOTE — Op Note (Addendum)
[facility] Patient Name: Gloria Green Procedure Date: 01/14/2022 MRN: [id number] Attending MD: Milus Banister , MD Date of Birth: [birth year] CSN: [id number] Age: 31 Admit Type: Inpatient Procedure:                Flexible Sigmoidoscopy Indications:              Severe colitis, bleeding, Hb 5 yesterday, recently                            diagnosed with extensive UC, failing to improve                            with oral steroids and 1 month of humira Providers:                Milus Banister, MD, Doristine Johns, RN, Frazier Richards, Technician Referring MD:              Medicines:                Monitored Anesthesia Care Complications:            No immediate complications. Estimated blood loss:                            None. Estimated Blood Loss:     Estimated blood loss: none. Procedure:                Pre-Anesthesia Assessment:                           - Prior to the procedure, a History and Physical                            was performed, and patient medications and                            allergies were reviewed. The patient's tolerance of                            previous anesthesia was also reviewed. The risks                            and benefits of the procedure and the sedation                            options and risks were discussed with the patient.                            All questions were answered, and informed consent                            was obtained. Prior Anticoagulants: The patient has  taken no previous anticoagulant or antiplatelet                            agents. ASA Grade Assessment: II - A patient with                            mild systemic disease. After reviewing the risks                            and benefits, the patient was deemed in                            satisfactory condition to undergo the procedure.                           After obtaining  informed consent, the scope was                            passed under direct vision. The [id number]-HQ190L                            ([id number]) Olympus colonoscope was introduced                            through the anus and advanced to the the sigmoid                            colon. The flexible sigmoidoscopy was accomplished                            without difficulty. The patient tolerated the                            procedure well. The quality of the bowel                            preparation was adequate. Scope In: Scope Out: Findings:      Severe inflammation from anus to the extent of the exam (mid sigmoid       colon), characterized by ulceration, pseudopolyps, very friable,       congested mucosa. This was biopsied for histology and also for viral       culture.      Small internal hemorrhoids.      The exam was otherwise without abnormality. Impression:               - Severe inflammation from anus to the extent of                            the exam (mid sigmoid colon), characterized by                            ulceration, pseudopolyps, very friable, congested  mucosa. This was biopsied for histology and also                            for viral culture.                           - Small internal hemorrhoids. Moderate Sedation:      Not Applicable - Patient had care per Anesthesia. Recommendation:           - EGD now. Procedure Code(s):        --- Professional ---                           [phone], Sigmoidoscopy, flexible; with biopsy, single                            or multiple Diagnosis Code(s):        --- Professional ---                           K52.9, Noninfective gastroenteritis and colitis,                            unspecified                           K92.1, Melena (includes Hematochezia) CPT copyright 2019 [employer]. All rights reserved. The codes documented in this report are preliminary and upon coder  review may  be revised to meet current compliance requirements. Milus Banister, MD 01/14/2022 10:25:29 AM This report has been signed electronically. Number of Addenda: 0

## 2022-01-14 NOTE — Op Note (Signed)
[facility] Patient Name: Gloria Green Procedure Date: 01/14/2022 MRN: [id number] Attending MD: Milus Banister , MD Date of Birth: 1991-02-01 CSN: [id number] Age: 31 Admit Type: Inpatient Procedure:                Upper GI endoscopy Indications:              Hematochezia Providers:                Milus Banister, MD, Doristine Johns, RN, Princess Bruins, RN, Frazier Richards, Technician Referring MD:              Medicines:                Monitored Anesthesia Care Complications:            No immediate complications. Estimated blood loss:                            None. Estimated Blood Loss:     Estimated blood loss: none. Procedure:                Pre-Anesthesia Assessment:                           - Prior to the procedure, a History and Physical                            was performed, and patient medications and                            allergies were reviewed. The patient's tolerance of                            previous anesthesia was also reviewed. The risks                            and benefits of the procedure and the sedation                            options and risks were discussed with the patient.                            All questions were answered, and informed consent                            was obtained. Prior Anticoagulants: The patient has                            taken no previous anticoagulant or antiplatelet                            agents. ASA Grade Assessment: II - A patient with  mild systemic disease. After reviewing the risks                            and benefits, the patient was deemed in                            satisfactory condition to undergo the procedure.                           After obtaining informed consent, the endoscope was                            passed under direct vision. Throughout the                            procedure, the patient's blood  pressure, pulse, and                            oxygen saturations were monitored continuously. The                            GIF-H190 ([id number] Olympus endoscope was introduced                            through the mouth, and advanced to the second part                            of duodenum. The upper GI endoscopy was                            accomplished without difficulty. The patient                            tolerated the procedure well. Scope In: Scope Out: Findings:      The esophagus was normal.      There was a medium to large amount of retained solid food in the stomach.      The examined duodenum was normal.      No old or fresh blood in the UGI tract. Impression:               - No fresh or old blood in the UGI tract.                           - Retained solid food in the stomach without                            anatomic outlet obstruction, suggesting a component                            of slow gastric emptying. Moderate Sedation:      Not Applicable - Patient had care per Anesthesia. Recommendation:           - Return for floor.                           -  Regular diet.                           - Continue IV solumedrol 60m BID until her severe                            colitis symptoms start to improve. If histology (or                            viral cultures) suggest overlying viral infection                            then will need to start appropriate antiviral meds. Procedure Code(s):        --- Professional ---                           [phone] Esophagogastroduodenoscopy, flexible,                            transoral; diagnostic, including collection of                            specimen(s) by brushing or washing, when performed                            (separate procedure) Diagnosis Code(s):        --- Professional ---                           K92.1, Melena (includes Hematochezia) CPT copyright 2019 American Medical Association. All rights  reserved. The codes documented in this report are preliminary and upon coder review may  be revised to meet current compliance requirements. DMilus Banister MD 01/14/2022 10:31:31 AM This report has been signed electronically. Number of Addenda: 0

## 2022-01-14 NOTE — Transfer of Care (Signed)
Immediate Anesthesia Transfer of Care Note  Patient: Gloria Green  Procedure(s) Performed: Procedure(s): ESOPHAGOGASTRODUODENOSCOPY (EGD) WITH PROPOFOL (N/A) FLEXIBLE SIGMOIDOSCOPY (N/A) BIOPSY  Patient Location: PACU and Endoscopy Unit  Anesthesia Type:MAC  Level of Consciousness: awake, alert  and oriented  Airway & Oxygen Therapy: Patient Spontanous Breathing and Patient connected to nasal cannula oxygen  Post-op Assessment: Report given to RN and Post -op Vital signs reviewed and stable  Post vital signs: Reviewed and stable  Last Vitals:  Vitals:   01/14/22 0450 01/14/22 0955  BP: 116/68 133/67  Pulse: 81 89  Resp: 18 20  Temp: 36.9 C (!) 36.3 C  SpO2: 47% 125%    Complications: No apparent anesthesia complications

## 2022-01-14 NOTE — Anesthesia Preprocedure Evaluation (Signed)
Anesthesia Evaluation  Patient identified by MRN, date of birth, ID band Patient awake    Reviewed: Allergy & Precautions, H&P , NPO status , Patient's Chart, lab work & pertinent test results, reviewed documented beta blocker date and time   Airway Mallampati: II  TM Distance: >3 FB Neck ROM: full    Dental no notable dental hx. (+) Teeth Intact, Dental Advisory Given   Pulmonary former smoker,    Pulmonary exam normal breath sounds clear to auscultation       Cardiovascular Exercise Tolerance: Good negative cardio ROS   Rhythm:regular Rate:Normal     Neuro/Psych PSYCHIATRIC DISORDERS Anxiety negative neurological ROS     GI/Hepatic Neg liver ROS, PUD,   Endo/Other  negative endocrine ROS  Renal/GU negative Renal ROS  negative genitourinary   Musculoskeletal   Abdominal   Peds  Hematology  (+) Blood dyscrasia, anemia ,   Anesthesia Other Findings   Reproductive/Obstetrics negative OB ROS                             Anesthesia Physical  Anesthesia Plan  ASA: 2 and emergent  Anesthesia Plan: MAC   Post-op Pain Management: Minimal or no pain anticipated   Induction: Intravenous  PONV Risk Score and Plan: 3 and Ondansetron, Treatment may vary due to age or medical condition, Dexamethasone and Midazolam  Airway Management Planned: Natural Airway, Nasal Cannula, Simple Face Mask and Mask  Additional Equipment: None  Intra-op Plan:   Post-operative Plan:   Informed Consent: I have reviewed the patients History and Physical, chart, labs and discussed the procedure including the risks, benefits and alternatives for the proposed anesthesia with the patient or authorized representative who has indicated his/her understanding and acceptance.     Dental Advisory Given  Plan Discussed with: CRNA, Anesthesiologist and Surgeon  Anesthesia Plan Comments: ( )        Anesthesia  Quick Evaluation

## 2022-01-14 NOTE — Progress Notes (Signed)
PROGRESS NOTE  Gloria Green DJS:[id number] DOB: [birth year] DOA: 01/12/2022 PCP: Alroy Dust, L.Marlou Sa, MD  Brief History   Gloria Green is a pleasant 31 y.o. female with medical history significant for ulcerative colitis on Humira, now presenting to the emergency department for evaluation of low hemoglobin on outpatient labs, fatigue, tachycardia, and swelling.  The patient reports approximately 4 months of GI bleeding and approximately 2 weeks of increased fatigue and resting tachycardia.  She has had bright red blood per rectum for the past 4 months but has also had some very dark stool over the past couple days.  She denies any recent fevers or chills and reports that her chronic abdominal discomfort has not changed recently.  She had been on prednisone but completed a taper within the past couple weeks.  She reports recent bilateral lower leg swelling and also felt that her hands and face may have been swollen this morning.  She denies vomiting.  She called her GI clinic with these complaints, blood work was obtained, and she was directed to the ED for evaluation of hemoglobin 5.5.   ED Course: Upon arrival to the ED, patient is found to be afebrile, saturating well on room air, tachycardic to 110s, and with stable blood pressure.  CBC notable for hemoglobin 5.4 with MCV 76.8.  Chemistry panel features and albumin of 2.5 and potassium 3.1.  GI was consulted by the ED physician and 2 units RBC were ordered for transfusion.  [facility] were consulted to admit the patient for further evaluation and care.   The patient underwent EGD and colonoscopy today. EGD demonstrated a large amount of retained food in the stomach indicating poor GI motility as there was no anatomic outlet obstruction. Recommendation is for continued IV solumedrol and PPI.   Consultants  Gastroenterology  Procedures  EGD  Antibiotics   Anti-infectives (From admission, onward)    None      Subjective   The patient is resting comfortably. No new complaints.  Objective   Vitals:  Vitals:   01/14/22 1050 01/14/22 1057  BP: 124/80 113/67  Pulse: 88 80  Resp: 18 14  Temp:    SpO2: 100% 98%    Exam:  Constitutional:  The patient is awake, alert, and oriented x 3. No acute distress. Respiratory:  No increased work of breathing. No wheezes, rales, or rhonchi No tactile fremitus Cardiovascular:  Regular rate and rhythm No murmurs, ectopy, or gallups. No lateral PMI. No thrills. Abdomen:  Abdomen is soft, somewhat tender, mildly distended No hernias, masses, or organomegaly Normoactive bowel sounds.  Musculoskeletal:  No cyanosis, clubbing, or edema Skin:  No rashes, lesions, ulcers palpation of skin: no induration or nodules Neurologic:  CN 2-12 intact Sensation all 4 extremities intact Psychiatric:  Mental status Mood, affect appropriate Orientation to person, place, time  judgment and insight appear intact    I have personally reviewed the following:   Today's Data  Vitals  Lab Data  CBC BMP CRP  Micro Data    Imaging  CT abdomen and pelvis  Cardiology Data    Other Data    Scheduled Meds:  citalopram  20 mg Oral QHS   methylPREDNISolone (SOLU-MEDROL) injection  30 mg Intravenous Q12H   sodium chloride flush  3 mL Intravenous Q12H   Continuous Infusions:  dextrose 5 % and 0.45% NaCl 75 mL/hr at 01/14/22 0341    Principal Problem:   Symptomatic anemia Active Problems:   UC (ulcerative colitis) ([place])  GI bleeding   Hypokalemia   LOS: 0 days   A & P  1. Symptomatic anemia; GI bleeding; ulcerative colitis  - Presents with Hgb 5.5 on outpatient labs, reports recent fatigue and resting tachycardia, 4 months of GI bleeding despite Humira and recent course of prednisone  - 2 units RBC ordered for transfusion from ED and GI was consulted by ED physician  - Check post-transfusion CBC, follow-up  - EGD performed today. GI recommends  continuation of IV Solumedrol.  2. Dysmotility: Retained food in stomach. The patient has advised to eat 6 small meals daily instead of 3 normal sized ones.    2. Hypokalemia  - Resolved.  - Monitor and supplement as necessary.    3. Swelling - Pt reports recent bilateral lower leg swelling, also felt reported facial and hand swelling that seemed to resolve  - Hand and face swelling not noted on exam, no JVD, leg swelling could be d/t iron-deficiency and/or low albumin   I have seen and examined this patient myself. I have spent 30 minutes in her evaluation and care.   DVT prophylaxis: SCDs  Code Status: Full  Level of Care: Level of care: Telemetry Family Communication: Husband at bedside  Disposition Plan:  Patient is from: home  Anticipated d/c is to: Home  Anticipated d/c date is: 1/27 or 01/14/22    Ellison Rieth, DO [facility] Direct contact: see [email or web address]  7PM-7AM contact night coverage as above 01/14/2022, 5:15 PM  LOS: 0 days

## 2022-01-14 NOTE — Progress Notes (Signed)
Pt states to be allergic to tele leads and unable to keep on. Called for hypoallergenic leads with no success. Jeannette Corpus, NP made aware. Central tele made aware as well.

## 2022-01-15 DIAGNOSIS — K30 Functional dyspepsia: Secondary | ICD-10-CM | POA: Diagnosis present

## 2022-01-15 DIAGNOSIS — D849 Immunodeficiency, unspecified: Secondary | ICD-10-CM | POA: Diagnosis present

## 2022-01-15 DIAGNOSIS — K644 Residual hemorrhoidal skin tags: Secondary | ICD-10-CM | POA: Diagnosis present

## 2022-01-15 DIAGNOSIS — D649 Anemia, unspecified: Secondary | ICD-10-CM

## 2022-01-15 DIAGNOSIS — F419 Anxiety disorder, unspecified: Secondary | ICD-10-CM | POA: Diagnosis present

## 2022-01-15 DIAGNOSIS — K648 Other hemorrhoids: Secondary | ICD-10-CM | POA: Diagnosis present

## 2022-01-15 DIAGNOSIS — Z87891 Personal history of nicotine dependence: Secondary | ICD-10-CM | POA: Diagnosis not present

## 2022-01-15 DIAGNOSIS — G4700 Insomnia, unspecified: Secondary | ICD-10-CM | POA: Diagnosis present

## 2022-01-15 DIAGNOSIS — Z91040 Latex allergy status: Secondary | ICD-10-CM | POA: Diagnosis not present

## 2022-01-15 DIAGNOSIS — K51911 Ulcerative colitis, unspecified with rectal bleeding: Secondary | ICD-10-CM | POA: Diagnosis present

## 2022-01-15 DIAGNOSIS — Z79899 Other long term (current) drug therapy: Secondary | ICD-10-CM | POA: Diagnosis not present

## 2022-01-15 DIAGNOSIS — K51011 Ulcerative (chronic) pancolitis with rectal bleeding: Secondary | ICD-10-CM | POA: Diagnosis not present

## 2022-01-15 DIAGNOSIS — E876 Hypokalemia: Secondary | ICD-10-CM | POA: Diagnosis present

## 2022-01-15 DIAGNOSIS — D500 Iron deficiency anemia secondary to blood loss (chronic): Secondary | ICD-10-CM | POA: Diagnosis not present

## 2022-01-15 DIAGNOSIS — R000 Tachycardia, unspecified: Secondary | ICD-10-CM | POA: Diagnosis present

## 2022-01-15 DIAGNOSIS — Z9225 Personal history of immunosupression therapy: Secondary | ICD-10-CM | POA: Diagnosis not present

## 2022-01-15 DIAGNOSIS — Z79620 Long term (current) use of immunosuppressive biologic: Secondary | ICD-10-CM | POA: Diagnosis not present

## 2022-01-15 DIAGNOSIS — D62 Acute posthemorrhagic anemia: Secondary | ICD-10-CM | POA: Diagnosis present

## 2022-01-15 DIAGNOSIS — R198 Other specified symptoms and signs involving the digestive system and abdomen: Secondary | ICD-10-CM | POA: Diagnosis not present

## 2022-01-15 DIAGNOSIS — Z20822 Contact with and (suspected) exposure to covid-19: Secondary | ICD-10-CM | POA: Diagnosis present

## 2022-01-15 DIAGNOSIS — K922 Gastrointestinal hemorrhage, unspecified: Secondary | ICD-10-CM | POA: Diagnosis not present

## 2022-01-15 DIAGNOSIS — Z882 Allergy status to sulfonamides status: Secondary | ICD-10-CM | POA: Diagnosis not present

## 2022-01-15 DIAGNOSIS — Z8249 Family history of ischemic heart disease and other diseases of the circulatory system: Secondary | ICD-10-CM | POA: Diagnosis not present

## 2022-01-15 LAB — CBC
HCT: 35.2 % — ABNORMAL LOW (ref 36.0–46.0)
Hemoglobin: 10.8 g/dL — ABNORMAL LOW (ref 12.0–15.0)
MCH: 25.9 pg — ABNORMAL LOW (ref 26.0–34.0)
MCHC: 30.7 g/dL (ref 30.0–36.0)
MCV: 84.4 fL (ref 80.0–100.0)
Platelets: 371 10*3/uL (ref 150–400)
RBC: 4.17 MIL/uL (ref 3.87–5.11)
RDW: 19.7 % — ABNORMAL HIGH (ref 11.5–15.5)
WBC: 11.9 10*3/uL — ABNORMAL HIGH (ref 4.0–10.5)
nRBC: 0.2 % (ref 0.0–0.2)

## 2022-01-15 LAB — BETA HCG QUANT (REF LAB): hCG Quant: <1 m[IU]/mL

## 2022-01-15 LAB — BASIC METABOLIC PANEL
Anion gap: 7 (ref 5–15)
BUN: 11 mg/dL (ref 6–20)
CO2: 23 mmol/L (ref 22–32)
Calcium: 8.5 mg/dL — ABNORMAL LOW (ref 8.9–10.3)
Chloride: 107 mmol/L (ref 98–111)
Creatinine, Ser: 0.66 mg/dL (ref 0.44–1.00)
GFR, Estimated: >60 mL/min (ref >60)
Glucose, Bld: 95 mg/dL (ref 70–99)
Potassium: 4 mmol/L (ref 3.5–5.1)
Sodium: 137 mmol/L (ref 135–145)

## 2022-01-15 NOTE — Progress Notes (Signed)
[facility] PROGRESS NOTE    Progress Note  [name]  VQQ:[id number] DOB: Jan 02, 1991 DOA: 01/12/2022 PCP: Alroy Dust, L.Marlou Sa, MD     Brief Narrative:   Gloria Green is an 31 y.o. female past medical history significant for ulcerative colitis on Humira presents to the emergency room with a low hemoglobin lab as an outpatient, fatigue and tachycardia, GI was consulted who performed an EGD that showed normal esophagus, and esophagus no fresh or old blood, some retained food without anatomic gastric outlet obstruction    Assessment/Plan:   Symptomatic anemia/acute blood loss anemia/lower GI bleed in the setting of ulcerative colitis: Hemoglobin on admission is 5 she status post 4 units of packed red blood cells and her hemoglobin this morning is 10. GI was consulted who performed an EGD that showed no source of bleeding. Flex sig showed ulcerative mucosa very friable biopsies were taken CMV. Continue IV Solu-Medrol and Humira per GI.  Dysmotility: Retained food in stomach advised small meals multiple times a day.  Hypokalemia: Resolved after supplementation.  Swelling: Not noted on physical exam she does not appear fluid overloaded.   DVT prophylaxis: scd Family Communication:husband Status is: Inpatient  Remains inpatient appropriate because: acute Ulcerative colitis flare  Code Status:     Code Status Orders  (From admission, onward)           Start     Ordered   01/12/22 2108  Full code  Continuous        01/12/22 2108           Code Status History     Date Active Date Inactive Code Status Order ID Comments User Context   06/16/2019 0808 06/17/2019 1131 Full Code [id number]  Molli Posey, MD Inpatient         IV Access:   Peripheral IV   Procedures and diagnostic studies:   CT CHEST ABDOMEN PELVIS W CONTRAST  Result Date: 01/13/2022 CLINICAL DATA:  Ulcerative colitis, bright red blood per rectum, anemia, lower extremity  edema EXAM: CT CHEST, ABDOMEN, AND PELVIS WITH CONTRAST TECHNIQUE: Multidetector CT imaging of the chest, abdomen and pelvis was performed following the standard protocol during bolus administration of intravenous contrast. RADIATION DOSE REDUCTION: This exam was performed according to the departmental dose-optimization program which includes automated exposure control, adjustment of the mA and/or kV according to patient size and/or use of iterative reconstruction technique. CONTRAST:  141m OMNIPAQUE IOHEXOL 300 MG/ML  SOLN COMPARISON:  10/16/2021 FINDINGS: CT CHEST FINDINGS Cardiovascular: The heart and great vessels are unremarkable without pericardial effusion. No evidence of thoracic aortic aneurysm or dissection. Mediastinum/Nodes: No enlarged mediastinal, hilar, or axillary lymph nodes. Thyroid gland, trachea, and esophagus demonstrate no significant findings. Lungs/Pleura: No acute airspace disease, effusion, or pneumothorax. Central airways are patent. 4 mm left upper lobe solid pulmonary nodule reference image 58/3. No other nodules or masses. Musculoskeletal: No acute or destructive bony lesions. Reconstructed images demonstrate no additional findings. CT ABDOMEN PELVIS FINDINGS Hepatobiliary: No focal liver abnormality is seen. No gallstones, gallbladder wall thickening, or biliary dilatation. Pancreas: Unremarkable. No pancreatic ductal dilatation or surrounding inflammatory changes. Spleen: Borderline splenomegaly, measuring 8.8 x 4.3 x 13.7 cm corresponding to 518 cc. No focal parenchymal abnormality. Adrenals/Urinary Tract: Grossly stable right adrenal adenoma measuring approximately 1.8 x 1.1 cm. The left adrenals unremarkable. Nonobstructing calculi lower pole right kidney unchanged, measuring less than 3 mm. No left-sided calculi. No obstructive uropathy within either kidney. Bladder is unremarkable. Stomach/Bowel: There is diffuse colonic  wall thickening extending from the cecum through the  rectum, consistent with patient's history of ulcerative colitis. The appendix appears unremarkable. No bowel obstruction or ileus. Vascular/Lymphatic: No significant vascular findings are present. No enlarged abdominal or pelvic lymph nodes. Reproductive: Uterus and bilateral adnexa are unremarkable. Other: No free fluid or free gas.  No abdominal wall hernia. Musculoskeletal: No acute or destructive bony lesions. Stable spondylosis at the lumbosacral junction. Subcutaneous gas within the anterior proximal left thigh may reflect recent subcutaneous injection. Reconstructed images demonstrate no additional findings. IMPRESSION: 1. Diffuse colonic wall thickening consistent with ulcerative colitis. 2. Borderline splenomegaly. 3. 4 mm left upper lobe pulmonary nodule. No follow-up needed if patient is low-risk. Non-contrast chest CT can be considered in 12 months if patient is high-risk. This recommendation follows the consensus statement: Guidelines for Management of Incidental Pulmonary Nodules Detected on CT Images: From the Fleischner Society 2017; Radiology 2017; 284:228-243. 4. Nonobstructing less than 3 mm right renal calculi. 5. Stable right adrenal adenoma. Electronically Signed   By: Randa Ngo M.D.   On: 01/13/2022 16:56     Medical Consultants:   None.   Subjective:    Gloria Green continues to have bloody bowel movements more than 6 a day  Objective:    Vitals:   01/14/22 1057 01/14/22 1819 01/14/22 2130 01/15/22 0521  BP: 113/67 128/88 115/73 128/77  Pulse: 80 81 84 72  Resp: 14 18 16 18   Temp:  98 F (36.7 C) 98.1 F (36.7 C) 98.4 F (36.9 C)  TempSrc:  Oral Oral Oral  SpO2: 98% 98% 96% 98%  Weight:    57.9 kg  Height:       SpO2: 98 %   Intake/Output Summary (Last 24 hours) at 01/15/2022 0754 Last data filed at 01/14/2022 1914 Gross per 24 hour  Intake 1428.52 ml  Output 1 ml  Net 1427.52 ml   Filed Weights   01/12/22 1749 01/12/22 2000 01/15/22 0521   Weight: 54.4 kg 54.4 kg 57.9 kg    Exam: General exam: In no acute distress. Respiratory system: Good air movement and clear to auscultation. Cardiovascular system: S1 & S2 heard, RRR. No JVD. Gastrointestinal system: Abdomen is nondistended, soft and nontender.  Extremities: No pedal edema. Skin: No rashes, lesions or ulcers Psychiatry: Judgement and insight appear normal. Mood & affect appropriate.    Data Reviewed:    Labs: Basic Metabolic Panel: Recent Labs  Lab 01/12/22 1532 01/12/22 1830 01/13/22 0900 01/14/22 0550 01/15/22 0559  NA 142 138 138 135 137  K 3.9 3.1* 3.6 3.9 4.0  CL 109 107 112* 107 107  CO2 28 26 22 23 23   GLUCOSE 92 92 76 111* 95  BUN 10 12 9 9 11   CREATININE 0.81 0.81 0.61 0.50 0.66  CALCIUM 8.1* 8.3* 7.3* 7.7* 8.5*  MG  --   --  1.4* 2.4  --    GFR Estimated Creatinine Clearance: 82.6 mL/min (by C-G formula based on SCr of 0.66 mg/dL). Liver Function Tests: Recent Labs  Lab 01/12/22 1532 01/12/22 1830  AST 11 14*  ALT 8 12  ALKPHOS 37* 38  BILITOT 0.3 0.4  PROT 5.8* 6.1*  ALBUMIN 2.8* 2.5*   Recent Labs  Lab 01/12/22 1830  LIPASE 54*   No results for input(s): AMMONIA in the last 168 hours. Coagulation profile Recent Labs  Lab 01/12/22 1830  INR 0.9   COVID-19 Labs  Recent Labs    01/13/22 0900  CRP 0.8  Lab Results  Component Value Date   SARSCOV2NAA NEGATIVE 01/12/2022   [place] NEGATIVE 10/16/2021   [place] NEGATIVE 06/12/2019    CBC: Recent Labs  Lab 01/12/22 1532 01/12/22 1830 01/13/22 0932 01/14/22 0550 01/15/22 0559  WBC 10.1 9.4 11.1* 14.7* 11.9*  NEUTROABS 4.3 3.7 4.3 9.0*  --   HGB 5.5 Repeated and verified X2.* 5.4* 10.2* 11.0* 10.8*  HCT 18.3 Repeated and verified X2.* 19.5* 32.0* 35.0* 35.2*  MCV 70.6* 76.8* 81.6 83.1 84.4  PLT 394.0 445* 294 366 371   Cardiac Enzymes: No results for input(s): CKTOTAL, CKMB, CKMBINDEX, TROPONINI in the last 168 hours. BNP (last 3 results) No  results for input(s): PROBNP in the last 8760 hours. CBG: No results for input(s): GLUCAP in the last 168 hours. D-Dimer: No results for input(s): DDIMER in the last 72 hours. Hgb A1c: No results for input(s): HGBA1C in the last 72 hours. Lipid Profile: No results for input(s): CHOL, HDL, LDLCALC, TRIG, CHOLHDL, LDLDIRECT in the last 72 hours. Thyroid function studies: No results for input(s): TSH, T4TOTAL, T3FREE, THYROIDAB in the last 72 hours.  Invalid input(s): FREET3 Anemia work up: No results for input(s): VITAMINB12, FOLATE, FERRITIN, TIBC, IRON, RETICCTPCT in the last 72 hours. Sepsis Labs: Recent Labs  Lab 01/12/22 1830 01/13/22 0932 01/14/22 0550 01/15/22 0559  WBC 9.4 11.1* 14.7* 11.9*   Microbiology Recent Results (from the past 240 hour(s))  Resp Panel by RT-PCR (Flu A&B, Covid) Nasopharyngeal Swab     Status: None   Collection Time: 01/12/22  8:34 PM   Specimen: Nasopharyngeal Swab; Nasopharyngeal(NP) swabs in vial transport medium  Result Value Ref Range Status   SARS Coronavirus 2 by RT PCR NEGATIVE NEGATIVE Final    Comment: (NOTE) SARS-CoV-2 target nucleic acids are NOT DETECTED.  The SARS-CoV-2 RNA is generally detectable in upper respiratory specimens during the acute phase of infection. The lowest concentration of SARS-CoV-2 viral copies this assay can detect is 138 copies/mL. A negative result does not preclude SARS-Cov-2 infection and should not be used as the sole basis for treatment or other patient management decisions. A negative result may occur with  improper specimen collection/handling, submission of specimen other than nasopharyngeal swab, presence of viral mutation(s) within the areas targeted by this assay, and inadequate number of viral copies(<138 copies/mL). A negative result must be combined with clinical observations, patient history, and epidemiological information. The expected result is Negative.  Fact Sheet for Patients:   EntrepreneurPulse.com.au  Fact Sheet for Healthcare Providers:  IncredibleEmployment.be  This test is no t yet approved or cleared by the [place] FDA and  has been authorized for detection and/or diagnosis of SARS-CoV-2 by FDA under an Emergency Use Authorization (EUA). This EUA will remain  in effect (meaning this test can be used) for the duration of the COVID-19 declaration under Section 564(b)(1) of the Act, 21 U.S.C.section 360bbb-3(b)(1), unless the authorization is terminated  or revoked sooner.       Influenza A by PCR NEGATIVE NEGATIVE Final   Influenza B by PCR NEGATIVE NEGATIVE Final    Comment: (NOTE) The Xpert Xpress SARS-CoV-2/FLU/RSV plus assay is intended as an aid in the diagnosis of influenza from Nasopharyngeal swab specimens and should not be used as a sole basis for treatment. Nasal washings and aspirates are unacceptable for Xpert Xpress SARS-CoV-2/FLU/RSV testing.  Fact Sheet for Patients: EntrepreneurPulse.com.au  Fact Sheet for Healthcare Providers: IncredibleEmployment.be  This test is not yet approved or cleared by the [place] and  has been authorized for detection and/or diagnosis of SARS-CoV-2 by FDA under an Emergency Use Authorization (EUA). This EUA will remain in effect (meaning this test can be used) for the duration of the COVID-19 declaration under Section 564(b)(1) of the Act, 21 U.S.C. section 360bbb-3(b)(1), unless the authorization is terminated or revoked.  Performed at [facility], [place] [address], [place], [place] [address]   C Difficile Quick Screen w PCR reflex     Status: None   Collection Time: 01/13/22  2:33 PM   Specimen: STOOL  Result Value Ref Range Status   C Diff antigen NEGATIVE NEGATIVE Final   C Diff toxin NEGATIVE NEGATIVE Final   C Diff interpretation No C. difficile detected.  Final    Comment: Performed at  [facility], [place] [address], [place], [place] [address]     Medications:    citalopram  20 mg Oral QHS   methylPREDNISolone (SOLU-MEDROL) injection  30 mg Intravenous Q12H   sodium chloride flush  3 mL Intravenous Q12H   Continuous Infusions:  dextrose 5 % and 0.45% NaCl 75 mL/hr at 01/14/22 0341      LOS: 0 days   Charlynne Cousins  [facility]  01/15/2022, 7:54 AM

## 2022-01-15 NOTE — Plan of Care (Signed)

## 2022-01-15 NOTE — Anesthesia Postprocedure Evaluation (Signed)
Anesthesia Post Note  Patient: Gloria Green  Procedure(s) Performed: ESOPHAGOGASTRODUODENOSCOPY (EGD) WITH PROPOFOL FLEXIBLE SIGMOIDOSCOPY BIOPSY     Patient location during evaluation: PACU Anesthesia Type: MAC Level of consciousness: awake and alert Pain management: pain level controlled Vital Signs Assessment: post-procedure vital signs reviewed and stable Respiratory status: spontaneous breathing, nonlabored ventilation, respiratory function stable and patient connected to nasal cannula oxygen Cardiovascular status: stable and blood pressure returned to baseline Postop Assessment: no apparent nausea or vomiting Anesthetic complications: no   No notable events documented.  Last Vitals:  Vitals:   01/15/22 0521 01/15/22 1523  BP: 128/77 122/74  Pulse: 72 79  Resp: 18 16  Temp: 36.9 C 36.8 C  SpO2: 98% 99%    Last Pain:  Vitals:   01/15/22 1711  TempSrc:   PainSc: 0-No pain                 Nieko Clarin

## 2022-01-15 NOTE — Progress Notes (Signed)
[facility] Progress Note    Since last GI note: Flex sig and EGD yesterday. See full reports in epic.  11 diarrheal stools since flex sig yesterday, vast majority were bloody.  No real abd pains. Tolerating regular diet.  Objective: Vital signs in last 24 hours: Temp:  [97.4 F (36.3 C)-98.4 F (36.9 C)] 98.4 F (36.9 C) (01/29 0521) Pulse Rate:  [72-89] 72 (01/29 0521) Resp:  [12-20] 18 (01/29 0521) BP: (103-133)/(60-94) 128/77 (01/29 0521) SpO2:  [96 %-100 %] 98 % (01/29 0521) Weight:  [57.9 kg] 57.9 kg (01/29 0521) Last BM Date: 01/13/22 General: alert and oriented times 3 Heart: regular rate and rythm Abdomen: soft, non-tender, non-distended, normal bowel sounds  Lab Results: Recent Labs    01/13/22 0932 01/14/22 0550 01/15/22 0559  WBC 11.1* 14.7* 11.9*  HGB 10.2* 11.0* 10.8*  PLT 294 366 371  MCV 81.6 83.1 84.4   Recent Labs    01/13/22 0900 01/14/22 0550 01/15/22 0559  NA 138 135 137  K 3.6 3.9 4.0  CL 112* 107 107  CO2 22 23 23   GLUCOSE 76 111* 95  BUN 9 9 11   CREATININE 0.61 0.50 0.66  CALCIUM 7.3* 7.7* 8.5*   Recent Labs    01/12/22 1532 01/12/22 1830  PROT 5.8* 6.1*  ALBUMIN 2.8* 2.5*  AST 11 14*  ALT 8 12  ALKPHOS 37* 38  BILITOT 0.3 0.4   Recent Labs    01/12/22 1830  INR 0.9    Medications: Scheduled Meds:  citalopram  20 mg Oral QHS   methylPREDNISolone (SOLU-MEDROL) injection  30 mg Intravenous Q12H   sodium chloride flush  3 mL Intravenous Q12H   Continuous Infusions:  dextrose 5 % and 0.45% NaCl 75 mL/hr at 01/14/22 0341   PRN Meds:.acetaminophen **OR** acetaminophen, LORazepam, ondansetron **OR** ondansetron (ZOFRAN) IV   Assessment/Plan: 31 y.o. female with severe extensive UC which has failed to remit after months of oral steroids, recent start of humira  She is starting Day #3 of IV steroids this afternoon (Solumedrol 30BID) and had her third round of humira two days ago (59m).    If samples  from her flex sig suggest viral infection, she will need to be started on appropriate antiviral.  We had a long discussion about her severe UC.  She understands that if IV steroids fail to induce remission after 7-10 days or so, many would consider this a failure of medical management.  Certainly a trial of a different biologic could be considered in the next several days (Remicade vs. Other) prior to decision about colectomy.  She has been miserable for about a year now because of her UC.  Dr. MRush Landmarkto be taking over GI service tomorrow.  DMilus Banister MD  01/15/2022, 7:38 AM [facility] Pager ([phone]

## 2022-01-16 ENCOUNTER — Encounter (HOSPITAL_COMMUNITY): Payer: Self-pay | Admitting: Gastroenterology

## 2022-01-16 DIAGNOSIS — K51011 Ulcerative (chronic) pancolitis with rectal bleeding: Secondary | ICD-10-CM

## 2022-01-16 DIAGNOSIS — Z9225 Personal history of immunosupression therapy: Secondary | ICD-10-CM

## 2022-01-16 LAB — CBC
HCT: 37.8 % (ref 36.0–46.0)
Hemoglobin: 11.9 g/dL — ABNORMAL LOW (ref 12.0–15.0)
MCH: 26.4 pg (ref 26.0–34.0)
MCHC: 31.5 g/dL (ref 30.0–36.0)
MCV: 84 fL (ref 80.0–100.0)
Platelets: 391 10*3/uL (ref 150–400)
RBC: 4.5 MIL/uL (ref 3.87–5.11)
RDW: 20 % — ABNORMAL HIGH (ref 11.5–15.5)
WBC: 13 10*3/uL — ABNORMAL HIGH (ref 4.0–10.5)
nRBC: 0 % (ref 0.0–0.2)

## 2022-01-16 LAB — TYPE AND SCREEN
ABO/RH(D): A POS
Antibody Screen: NEGATIVE
Unit division: 0
Unit division: 0
Unit division: 0
Unit division: 0

## 2022-01-16 LAB — BPAM RBC
Blood Product Expiration Date: 202302132359
Blood Product Expiration Date: 202302132359
Blood Product Expiration Date: 202302132359
Blood Product Expiration Date: 202302132359
ISSUE DATE / TIME: 202301262043
ISSUE DATE / TIME: 202301270019
ISSUE DATE / TIME: 202301270219
ISSUE DATE / TIME: 202301270437
Unit Type and Rh: 6200
Unit Type and Rh: 6200
Unit Type and Rh: 6200
Unit Type and Rh: 6200

## 2022-01-16 LAB — SEDIMENTATION RATE: Sed Rate: 12 mm/hr (ref 0–22)

## 2022-01-16 NOTE — Progress Notes (Signed)
° °   Progress Note   Subjective  Chief Complaint: Anemia, diarrhea  Flex sig and endoscopy 01/28 with Dr. Ardis Hughs. Patient on day 4 of IV steroids, per patient 4 total rounds of humira including induction, last one 4 days ago.  Patient is elementary special ed teacher, had a very helpful stool diary. Patient was having 20+ stools when she first arrived the majority just blood. Yesterday patient had 10 stools, patient had a 7-hour period last night without any bowel movements, and has since had 3 bowel movements this morning. Patient states are becoming more formed, less blood but still are bloody. Continues to have some lower abdominal discomfort and tenesmus feeling incomplete evacuation. No nocturnal diarrhea. Patient denies fever, chills, nausea, vomiting.    Objective   Vital signs in last 24 hours: Temp:  [97.8 F (36.6 C)-98.2 F (36.8 C)] 98 F (36.7 C) (01/30 0459) Pulse Rate:  [69-80] 80 (01/30 0459) Resp:  [16-18] 18 (01/30 0459) BP: (122-126)/(74-86) 122/78 (01/30 0459) SpO2:  [98 %-99 %] 99 % (01/30 0459) Last BM Date: 01/16/22  General:   female in no acute distress  Abdomen:  Soft, Normal bowel sounds.  no  tenderness  Neurologic:  Alert and  oriented x4;  grossly normal neurologically.  Psychiatric: Cooperative. Normal mood and affect.  Intake/Output from previous day: 01/29 0701 - 01/30 0700 In: 480 [P.O.:480] Out: -  Intake/Output this shift: No intake/output data recorded.  Lab Results: Recent Labs    01/14/22 0550 01/15/22 0559 01/16/22 0523  WBC 14.7* 11.9* 13.0*  HGB 11.0* 10.8* 11.9*  HCT 35.0* 35.2* 37.8  PLT 366 371 391   BMET Recent Labs    01/13/22 0900 01/14/22 0550 01/15/22 0559  NA 138 135 137  K 3.6 3.9 4.0  CL 112* 107 107  CO2 22 23 23   GLUCOSE 76 111* 95  BUN 9 9 11   CREATININE 0.61 0.50 0.66  CALCIUM 7.3* 7.7* 8.5*   LFT No results for input(s): PROT, ALBUMIN, AST, ALT, ALKPHOS, BILITOT, BILIDIR, IBILI in the last  72 hours. PT/INR No results for input(s): LABPROT, INR in the last 72 hours.  Studies/Results: No results found.    Impression/Plan:   31 year old female with history of severe extensive UC, non responder to steroids, recently started on Humira.   Symptomatic anemia in setting of ulcerative colitis 01/13/2022 CRP 0.8 pending today's CRP 01/16/2022 WBC 13.0 HGB 11.9 MCV 84.0, status post 4 units packed red blood cells Normal upper endoscopy Day 4 of IV steroids, fourth round Humira last dose on Friday Patient clinically appears to be improving with diarrhea diary, will evaluate further tomorrow. Continue on 30 mg IV Solu-Medrol every 12 Pending pathology and viral culture from flex sigmoidoscopy. Will evaluate further tomorrow, with plan of decreasing steroids, treating with antiviral if CMV comes back positive, versus discussing with her office about potential for IV Remicade.  But at this time patient does appear to be improving clinically so we will monitor closely and treat as needed.    Future Appointments  Date Time Provider [place]  02/09/2022 11:00 AM Mauri Pole, MD [facility]      LOS: 1 day   [name]  01/16/2022, 8:08 AM

## 2022-01-16 NOTE — Progress Notes (Signed)
[facility] PROGRESS NOTE    Progress Note  Gloria Green  ZJQ:[id number] DOB: July 21, 1991 DOA: 01/12/2022 PCP: Alroy Dust, L.Marlou Sa, MD     Brief Narrative:   Gloria Green is an 31 y.o. female past medical history significant for ulcerative colitis on Humira presents to the emergency room with a low hemoglobin lab as an outpatient, fatigue and tachycardia, GI was consulted who performed an EGD that showed normal esophagus, and esophagus no fresh or old blood, some retained food without anatomic gastric outlet obstruction    Assessment/Plan:   Symptomatic anemia/acute blood loss anemia/lower GI bleed in the setting of ulcerative colitis: Hemoglobin on admission is 5 she status post 4 units of packed red blood cells and her hemoglobin this morning is 10. Continue steroid taper dose. Biopsies are pending. CMV cultures It seems like her bowel movements are slowing down but still bloody. I think it is reasonable to let her go out and downstairs to see her son which is 53 months old  Dysmotility: Retained food in stomach advised small meals multiple times a day.  Hypokalemia: Resolved after supplementation.    DVT prophylaxis: scd Family Communication:husband Status is: Inpatient  Remains inpatient appropriate because: acute Ulcerative colitis flare  Code Status:     Code Status Orders  (From admission, onward)           Start     Ordered   01/12/22 2108  Full code  Continuous        01/12/22 2108           Code Status History     Date Active Date Inactive Code Status Order ID Comments User Context   06/16/2019 0808 06/17/2019 1131 Full Code [id number]  Molli Posey, MD Inpatient         IV Access:   Peripheral IV   Procedures and diagnostic studies:   No results found.   Medical Consultants:   None.   Subjective:    Gloria Green continues to have bloody bowel movement.  Objective:    Vitals:   01/15/22 0521  01/15/22 1523 01/15/22 2213 01/16/22 0459  BP: 128/77 122/74 126/86 122/78  Pulse: 72 79 69 80  Resp: 18 16 18 18   Temp: 98.4 F (36.9 C) 98.2 F (36.8 C) 97.8 F (36.6 C) 98 F (36.7 C)  TempSrc: Oral Oral Oral Oral  SpO2: 98% 99% 98% 99%  Weight: 57.9 kg     Height:       SpO2: 99 %   Intake/Output Summary (Last 24 hours) at 01/16/2022 0832 Last data filed at 01/15/2022 1421 Gross per 24 hour  Intake 480 ml  Output --  Net 480 ml    Filed Weights   01/12/22 1749 01/12/22 2000 01/15/22 0521  Weight: 54.4 kg 54.4 kg 57.9 kg    Exam: General exam: In no acute distress. Respiratory system: Good air movement and clear to auscultation. Cardiovascular system: S1 & S2 heard, RRR. No JVD. Gastrointestinal system: Abdomen is nondistended, soft and nontender.  Extremities: No pedal edema. Skin: No rashes, lesions or ulcers Psychiatry: Judgement and insight appear normal. Mood & affect appropriate.   Data Reviewed:    Labs: Basic Metabolic Panel: Recent Labs  Lab 01/12/22 1532 01/12/22 1830 01/13/22 0900 01/14/22 0550 01/15/22 0559  NA 142 138 138 135 137  K 3.9 3.1* 3.6 3.9 4.0  CL 109 107 112* 107 107  CO2 28 26 22 23 23   GLUCOSE 92 92 76 111* 95  BUN 10 12 9 9 11   CREATININE 0.81 0.81 0.61 0.50 0.66  CALCIUM 8.1* 8.3* 7.3* 7.7* 8.5*  MG  --   --  1.4* 2.4  --     GFR Estimated Creatinine Clearance: 82.6 mL/min (by C-G formula based on SCr of 0.66 mg/dL). Liver Function Tests: Recent Labs  Lab 01/12/22 1532 01/12/22 1830  AST 11 14*  ALT 8 12  ALKPHOS 37* 38  BILITOT 0.3 0.4  PROT 5.8* 6.1*  ALBUMIN 2.8* 2.5*    Recent Labs  Lab 01/12/22 1830  LIPASE 54*    No results for input(s): AMMONIA in the last 168 hours. Coagulation profile Recent Labs  Lab 01/12/22 1830  INR 0.9    COVID-19 Labs  Recent Labs    01/13/22 0900  CRP 0.8     Lab Results  Component Value Date   SARSCOV2NAA NEGATIVE 01/12/2022   [place] NEGATIVE  10/16/2021   [place] NEGATIVE 06/12/2019    CBC: Recent Labs  Lab 01/12/22 1532 01/12/22 1830 01/13/22 0932 01/14/22 0550 01/15/22 0559 01/16/22 0523  WBC 10.1 9.4 11.1* 14.7* 11.9* 13.0*  NEUTROABS 4.3 3.7 4.3 9.0*  --   --   HGB 5.5 Repeated and verified X2.* 5.4* 10.2* 11.0* 10.8* 11.9*  HCT 18.3 Repeated and verified X2.* 19.5* 32.0* 35.0* 35.2* 37.8  MCV 70.6* 76.8* 81.6 83.1 84.4 84.0  PLT 394.0 445* 294 366 371 391    Cardiac Enzymes: No results for input(s): CKTOTAL, CKMB, CKMBINDEX, TROPONINI in the last 168 hours. BNP (last 3 results) No results for input(s): PROBNP in the last 8760 hours. CBG: No results for input(s): GLUCAP in the last 168 hours. D-Dimer: No results for input(s): DDIMER in the last 72 hours. Hgb A1c: No results for input(s): HGBA1C in the last 72 hours. Lipid Profile: No results for input(s): CHOL, HDL, LDLCALC, TRIG, CHOLHDL, LDLDIRECT in the last 72 hours. Thyroid function studies: No results for input(s): TSH, T4TOTAL, T3FREE, THYROIDAB in the last 72 hours.  Invalid input(s): FREET3 Anemia work up: No results for input(s): VITAMINB12, FOLATE, FERRITIN, TIBC, IRON, RETICCTPCT in the last 72 hours. Sepsis Labs: Recent Labs  Lab 01/13/22 0932 01/14/22 0550 01/15/22 0559 01/16/22 0523  WBC 11.1* 14.7* 11.9* 13.0*    Microbiology Recent Results (from the past 240 hour(s))  Resp Panel by RT-PCR (Flu A&B, Covid) Nasopharyngeal Swab     Status: None   Collection Time: 01/12/22  8:34 PM   Specimen: Nasopharyngeal Swab; Nasopharyngeal(NP) swabs in vial transport medium  Result Value Ref Range Status   SARS Coronavirus 2 by RT PCR NEGATIVE NEGATIVE Final    Comment: (NOTE) SARS-CoV-2 target nucleic acids are NOT DETECTED.  The SARS-CoV-2 RNA is generally detectable in upper respiratory specimens during the acute phase of infection. The lowest concentration of SARS-CoV-2 viral copies this assay can detect is 138 copies/mL. A  negative result does not preclude SARS-Cov-2 infection and should not be used as the sole basis for treatment or other patient management decisions. A negative result may occur with  improper specimen collection/handling, submission of specimen other than nasopharyngeal swab, presence of viral mutation(s) within the areas targeted by this assay, and inadequate number of viral copies(<138 copies/mL). A negative result must be combined with clinical observations, patient history, and epidemiological information. The expected result is Negative.  Fact Sheet for Patients:  EntrepreneurPulse.com.au  Fact Sheet for Healthcare Providers:  IncredibleEmployment.be  This test is no t yet approved or cleared by the [place] FDA  and  has been authorized for detection and/or diagnosis of SARS-CoV-2 by FDA under an Emergency Use Authorization (EUA). This EUA will remain  in effect (meaning this test can be used) for the duration of the COVID-19 declaration under Section 564(b)(1) of the Act, 21 U.S.C.section 360bbb-3(b)(1), unless the authorization is terminated  or revoked sooner.       Influenza A by PCR NEGATIVE NEGATIVE Final   Influenza B by PCR NEGATIVE NEGATIVE Final    Comment: (NOTE) The Xpert Xpress SARS-CoV-2/FLU/RSV plus assay is intended as an aid in the diagnosis of influenza from Nasopharyngeal swab specimens and should not be used as a sole basis for treatment. Nasal washings and aspirates are unacceptable for Xpert Xpress SARS-CoV-2/FLU/RSV testing.  Fact Sheet for Patients: EntrepreneurPulse.com.au  Fact Sheet for Healthcare Providers: IncredibleEmployment.be  This test is not yet approved or cleared by the [place] FDA and has been authorized for detection and/or diagnosis of SARS-CoV-2 by FDA under an Emergency Use Authorization (EUA). This EUA will remain in effect (meaning this test can  be used) for the duration of the COVID-19 declaration under Section 564(b)(1) of the Act, 21 U.S.C. section 360bbb-3(b)(1), unless the authorization is terminated or revoked.  Performed at [facility], [place] [address], [place], [place] [address]   C Difficile Quick Screen w PCR reflex     Status: None   Collection Time: 01/13/22  2:33 PM   Specimen: STOOL  Result Value Ref Range Status   C Diff antigen NEGATIVE NEGATIVE Final   C Diff toxin NEGATIVE NEGATIVE Final   C Diff interpretation No C. difficile detected.  Final    Comment: Performed at [facility], [place] [address], [place], [place] [address]     Medications:    citalopram  20 mg Oral QHS   methylPREDNISolone (SOLU-MEDROL) injection  30 mg Intravenous Q12H   sodium chloride flush  3 mL Intravenous Q12H   Continuous Infusions:  dextrose 5 % and 0.45% NaCl 75 mL/hr at 01/14/22 0341      LOS: 1 day   Charlynne Cousins  [facility]  01/16/2022, 8:32 AM

## 2022-01-17 DIAGNOSIS — D62 Acute posthemorrhagic anemia: Secondary | ICD-10-CM

## 2022-01-17 LAB — CBC WITH DIFFERENTIAL/PLATELET
Abs Immature Granulocytes: 0.1 10*3/uL — ABNORMAL HIGH (ref 0.00–0.07)
Basophils Absolute: 0 10*3/uL (ref 0.0–0.1)
Basophils Relative: 0 %
Eosinophils Absolute: 0.1 10*3/uL (ref 0.0–0.5)
Eosinophils Relative: 1 %
HCT: 38.4 % (ref 36.0–46.0)
Hemoglobin: 11.6 g/dL — ABNORMAL LOW (ref 12.0–15.0)
Immature Granulocytes: 1 %
Lymphocytes Relative: 31 %
Lymphs Abs: 4.2 10*3/uL — ABNORMAL HIGH (ref 0.7–4.0)
MCH: 25.9 pg — ABNORMAL LOW (ref 26.0–34.0)
MCHC: 30.2 g/dL (ref 30.0–36.0)
MCV: 85.7 fL (ref 80.0–100.0)
Monocytes Absolute: 1.9 10*3/uL — ABNORMAL HIGH (ref 0.1–1.0)
Monocytes Relative: 13 %
Neutro Abs: 7.5 10*3/uL (ref 1.7–7.7)
Neutrophils Relative %: 54 %
Platelets: 448 10*3/uL — ABNORMAL HIGH (ref 150–400)
RBC: 4.48 MIL/uL (ref 3.87–5.11)
RDW: 20 % — ABNORMAL HIGH (ref 11.5–15.5)
WBC: 13.8 10*3/uL — ABNORMAL HIGH (ref 4.0–10.5)
nRBC: 0 % (ref 0.0–0.2)

## 2022-01-17 LAB — HIGH SENSITIVITY CRP: CRP, High Sensitivity: 2.71 mg/L (ref 0.00–3.00)

## 2022-01-17 MED ORDER — PREDNISONE 20 MG PO TABS
40.0000 mg | ORAL_TABLET | Freq: Every day | ORAL | Status: AC
  Administered 2022-01-18 – 2022-01-20 (×3): 40 mg via ORAL
  Filled 2022-01-17 (×3): qty 2

## 2022-01-17 NOTE — Progress Notes (Signed)
° °   Progress Note   Subjective  Chief Complaint: Anemia, diarrhea  Flex sig and endoscopy 01/28 with Dr. Ardis Hughs. Patient on day 5 of IV steroids, per patient 3 total rounds of humira including induction, last one 01/27.  Patient was having 20+ stools when she first arrived the majority just blood. Yesterday had 7 stools total, has has had 2 stools today.  Patient states she is seeing less blood, becoming more formed. Patient had no bowel movements between 8:30 PM to 2:30 AM, states time between bowel movements is increasing. Continues to have some lower abdominal discomfort and tenesmus feeling incomplete evacuation. No nocturnal diarrhea. Patient denies fever, chills, nausea, vomiting.    Objective   Vital signs in last 24 hours: Temp:  [98 F (36.7 C)-98.1 F (36.7 C)] 98 F (36.7 C) (01/31 0533) Pulse Rate:  [84-87] 87 (01/31 0533) Resp:  [18] 18 (01/31 0533) BP: (108-126)/(72-78) 108/78 (01/31 0533) SpO2:  [99 %-100 %] 100 % (01/31 0533) Last BM Date: 01/16/22  General:   female in no acute distress  Abdomen:  Soft, Normal bowel sounds.  no  tenderness  Neurologic:  Alert and  oriented x4;  grossly normal neurologically.  Psychiatric: Cooperative. Normal mood and affect.  Intake/Output from previous day: 01/30 0701 - 01/31 0700 In: 670 [P.O.:670] Out: -  Intake/Output this shift: No intake/output data recorded.  Lab Results: Recent Labs    01/15/22 0559 01/16/22 0523  WBC 11.9* 13.0*  HGB 10.8* 11.9*  HCT 35.2* 37.8  PLT 371 391   BMET Recent Labs    01/15/22 0559  NA 137  K 4.0  CL 107  CO2 23  GLUCOSE 95  BUN 11  CREATININE 0.66  CALCIUM 8.5*   LFT No results for input(s): PROT, ALBUMIN, AST, ALT, ALKPHOS, BILITOT, BILIDIR, IBILI in the last 72 hours. PT/INR No results for input(s): LABPROT, INR in the last 72 hours.  Studies/Results: No results found.    Impression/Plan:   31 year old female with history of severe extensive UC, non  responder to steroids, recently started on Humira.   Symptomatic anemia in setting of ulcerative colitis 01/13/2022 CRP 0.8  01/16/2022 WBC 13.0 HGB 11.9 MCV 84.0, status post 4 units packed red blood cells  Normal upper endoscopy Day 5 of IV steroids, 3rd round Humira last dose on Friday Patient clinically appears to be improving with diarrhea diary, will evaluate further tomorrow. Continue on 30 mg IV Solu-Medrol every 12 Pending pathology Discussed with primary GI, Dr. Silverio Decamp, will get humira antibody and levels, repeat CBC, increase humira to once a week and pending results can consider Remicade versus Rinvoq.  She has never had fecal calprotectin, will get this this visit for baseline   Future Appointments  Date Time Provider [place]  02/09/2022 11:00 AM Mauri Pole, MD [facility]      LOS: 2 days   [name]  01/17/2022, 8:14 AM

## 2022-01-17 NOTE — Progress Notes (Signed)
°  Transition of Care [facility]) Screening Note   Patient Details  Name: Gloria Green Date of Birth: 1991-11-18   Transition of Care [facility]) CM/SW Contact:    Shironda Kain, Marjie Skiff, RN Phone Number: 01/17/2022, 2:13 PM    Transition of Care Department [facility]) has reviewed patient and no TOC needs have been identified at this time. We will continue to monitor patient advancement through interdisciplinary progression rounds. If new patient transition needs arise, please place a TOC consult.

## 2022-01-17 NOTE — Progress Notes (Signed)
[facility] PROGRESS NOTE    Progress Note  Gloria Green  ZPH:[id number] DOB: 12-Feb-1991 DOA: 01/12/2022 PCP: Alroy Dust, L.Marlou Sa, MD     Brief Narrative:   [place] is an 31 y.o. female past medical history significant for ulcerative colitis on Humira presents to the emergency room with a low hemoglobin lab as an outpatient, fatigue and tachycardia, Hemoglobin on admission is 5 she status post 4 units of packed red blood cells and GI was consulted who performed an EGD that showed normal esophagus, and esophagus no fresh or old blood, some retained food without anatomic gastric outlet obstruction, Flex-sig showed friable mucosa and raw    Assessment/Plan:   Symptomatic anemia/acute blood loss anemia/lower GI bleed in the setting of ulcerative colitis: Will transition to oral steroids today. CMV cultures pending Bowel movements are slowing down and bloody.  Dysmotility: Retained food in stomach advised small meals multiple times a day.  Hypokalemia: Resolved after supplementation.    DVT prophylaxis: scd Family Communication:husband Status is: Inpatient  Remains inpatient appropriate because: acute Ulcerative colitis flare  Code Status:     Code Status Orders  (From admission, onward)           Start     Ordered   01/12/22 2108  Full code  Continuous        01/12/22 2108           Code Status History     Date Active Date Inactive Code Status Order ID Comments User Context   06/16/2019 0808 06/17/2019 1131 Full Code [id number]  Molli Posey, MD Inpatient         IV Access:   Peripheral IV   Procedures and diagnostic studies:   No results found.   Medical Consultants:   None.   Subjective:    [name] she relates her bowel movements have slowed down.  Objective:    Vitals:   01/15/22 2213 01/16/22 0459 01/16/22 2110 01/17/22 0533  BP: 126/86 122/78 126/72 108/78  Pulse: 69 80 84 87  Resp: 18 18 18  18   Temp: 97.8 F (36.6 C) 98 F (36.7 C) 98.1 F (36.7 C) 98 F (36.7 C)  TempSrc: Oral Oral Oral Oral  SpO2: 98% 99% 99% 100%  Weight:      Height:       SpO2: 100 %   Intake/Output Summary (Last 24 hours) at 01/17/2022 0922 Last data filed at 01/17/2022 0359 Gross per 24 hour  Intake 370 ml  Output --  Net 370 ml    Filed Weights   01/12/22 1749 01/12/22 2000 01/15/22 0521  Weight: 54.4 kg 54.4 kg 57.9 kg    Exam: General exam: In no acute distress. Respiratory system: Good air movement and clear to auscultation. Cardiovascular system: S1 & S2 heard, RRR. No JVD. Gastrointestinal system: Abdomen is nondistended, soft and nontender.  Extremities: No pedal edema. Skin: No rashes, lesions or ulcers Psychiatry: Judgement and insight appear normal. Mood & affect appropriate.   Data Reviewed:    Labs: Basic Metabolic Panel: Recent Labs  Lab 01/12/22 1532 01/12/22 1830 01/13/22 0900 01/14/22 0550 01/15/22 0559  NA 142 138 138 135 137  K 3.9 3.1* 3.6 3.9 4.0  CL 109 107 112* 107 107  CO2 28 26 22 23 23   GLUCOSE 92 92 76 111* 95  BUN 10 12 9 9 11   CREATININE 0.81 0.81 0.61 0.50 0.66  CALCIUM 8.1* 8.3* 7.3* 7.7* 8.5*  MG  --   --  1.4* 2.4  --     GFR Estimated Creatinine Clearance: 82.6 mL/min (by C-G formula based on SCr of 0.66 mg/dL). Liver Function Tests: Recent Labs  Lab 01/12/22 1532 01/12/22 1830  AST 11 14*  ALT 8 12  ALKPHOS 37* 38  BILITOT 0.3 0.4  PROT 5.8* 6.1*  ALBUMIN 2.8* 2.5*    Recent Labs  Lab 01/12/22 1830  LIPASE 54*    No results for input(s): AMMONIA in the last 168 hours. Coagulation profile Recent Labs  Lab 01/12/22 1830  INR 0.9    COVID-19 Labs  No results for input(s): DDIMER, FERRITIN, LDH, CRP in the last 72 hours.   Lab Results  Component Value Date   SARSCOV2NAA NEGATIVE 01/12/2022   [place] NEGATIVE 10/16/2021   [place] NEGATIVE 06/12/2019    CBC: Recent Labs  Lab 01/12/22 1532  01/12/22 1830 01/13/22 0932 01/14/22 0550 01/15/22 0559 01/16/22 0523  WBC 10.1 9.4 11.1* 14.7* 11.9* 13.0*  NEUTROABS 4.3 3.7 4.3 9.0*  --   --   HGB 5.5 Repeated and verified X2.* 5.4* 10.2* 11.0* 10.8* 11.9*  HCT 18.3 Repeated and verified X2.* 19.5* 32.0* 35.0* 35.2* 37.8  MCV 70.6* 76.8* 81.6 83.1 84.4 84.0  PLT 394.0 445* 294 366 371 391    Cardiac Enzymes: No results for input(s): CKTOTAL, CKMB, CKMBINDEX, TROPONINI in the last 168 hours. BNP (last 3 results) No results for input(s): PROBNP in the last 8760 hours. CBG: No results for input(s): GLUCAP in the last 168 hours. D-Dimer: No results for input(s): DDIMER in the last 72 hours. Hgb A1c: No results for input(s): HGBA1C in the last 72 hours. Lipid Profile: No results for input(s): CHOL, HDL, LDLCALC, TRIG, CHOLHDL, LDLDIRECT in the last 72 hours. Thyroid function studies: No results for input(s): TSH, T4TOTAL, T3FREE, THYROIDAB in the last 72 hours.  Invalid input(s): FREET3 Anemia work up: No results for input(s): VITAMINB12, FOLATE, FERRITIN, TIBC, IRON, RETICCTPCT in the last 72 hours. Sepsis Labs: Recent Labs  Lab 01/13/22 0932 01/14/22 0550 01/15/22 0559 01/16/22 0523  WBC 11.1* 14.7* 11.9* 13.0*    Microbiology Recent Results (from the past 240 hour(s))  Resp Panel by RT-PCR (Flu A&B, Covid) Nasopharyngeal Swab     Status: None   Collection Time: 01/12/22  8:34 PM   Specimen: Nasopharyngeal Swab; Nasopharyngeal(NP) swabs in vial transport medium  Result Value Ref Range Status   SARS Coronavirus 2 by RT PCR NEGATIVE NEGATIVE Final    Comment: (NOTE) SARS-CoV-2 target nucleic acids are NOT DETECTED.  The SARS-CoV-2 RNA is generally detectable in upper respiratory specimens during the acute phase of infection. The lowest concentration of SARS-CoV-2 viral copies this assay can detect is 138 copies/mL. A negative result does not preclude SARS-Cov-2 infection and should not be used as the sole basis  for treatment or other patient management decisions. A negative result may occur with  improper specimen collection/handling, submission of specimen other than nasopharyngeal swab, presence of viral mutation(s) within the areas targeted by this assay, and inadequate number of viral copies(<138 copies/mL). A negative result must be combined with clinical observations, patient history, and epidemiological information. The expected result is Negative.  Fact Sheet for Patients:  EntrepreneurPulse.com.au  Fact Sheet for Healthcare Providers:  IncredibleEmployment.be  This test is no t yet approved or cleared by the [place] FDA and  has been authorized for detection and/or diagnosis of SARS-CoV-2 by FDA under an Emergency Use Authorization (EUA). This EUA will remain  in effect (meaning  this test can be used) for the duration of the COVID-19 declaration under Section 564(b)(1) of the Act, 21 U.S.C.section 360bbb-3(b)(1), unless the authorization is terminated  or revoked sooner.       Influenza A by PCR NEGATIVE NEGATIVE Final   Influenza B by PCR NEGATIVE NEGATIVE Final    Comment: (NOTE) The Xpert Xpress SARS-CoV-2/FLU/RSV plus assay is intended as an aid in the diagnosis of influenza from Nasopharyngeal swab specimens and should not be used as a sole basis for treatment. Nasal washings and aspirates are unacceptable for Xpert Xpress SARS-CoV-2/FLU/RSV testing.  Fact Sheet for Patients: EntrepreneurPulse.com.au  Fact Sheet for Healthcare Providers: IncredibleEmployment.be  This test is not yet approved or cleared by the [place] FDA and has been authorized for detection and/or diagnosis of SARS-CoV-2 by FDA under an Emergency Use Authorization (EUA). This EUA will remain in effect (meaning this test can be used) for the duration of the COVID-19 declaration under Section 564(b)(1) of the Act, 21  U.S.C. section 360bbb-3(b)(1), unless the authorization is terminated or revoked.  Performed at [facility], [place] [address], [place], [place] [address]   C Difficile Quick Screen w PCR reflex     Status: None   Collection Time: 01/13/22  2:33 PM   Specimen: STOOL  Result Value Ref Range Status   C Diff antigen NEGATIVE NEGATIVE Final   C Diff toxin NEGATIVE NEGATIVE Final   C Diff interpretation No C. difficile detected.  Final    Comment: Performed at [facility], [place] [address] [place], [place] [address]  Virus culture     Status: None   Collection Time: 01/14/22 10:10 AM   Specimen: PATH GI biopsy; Tissue  Result Value Ref Range Status   Viral Culture Comment  Final    Comment: (NOTE) Preliminary Report: No virus isolated at 24 hours. Next report to follow after 4 days. Performed At: [facility] [place], [place] [id number] Rush Farmer MD SE:[id number]      Medications:    citalopram  20 mg Oral QHS   methylPREDNISolone (SOLU-MEDROL) injection  30 mg Intravenous Q12H   sodium chloride flush  3 mL Intravenous Q12H   Continuous Infusions:  dextrose 5 % and 0.45% NaCl 75 mL/hr at 01/14/22 0341      LOS: 2 days   Charlynne Cousins  [facility]  01/17/2022, 9:22 AM

## 2022-01-18 DIAGNOSIS — R198 Other specified symptoms and signs involving the digestive system and abdomen: Secondary | ICD-10-CM

## 2022-01-18 LAB — COMPREHENSIVE METABOLIC PANEL
ALT: 12 U/L (ref 0–44)
AST: 10 U/L — ABNORMAL LOW (ref 15–41)
Albumin: 2.7 g/dL — ABNORMAL LOW (ref 3.5–5.0)
Alkaline Phosphatase: 43 U/L (ref 38–126)
Anion gap: 9 (ref 5–15)
BUN: 14 mg/dL (ref 6–20)
CO2: 24 mmol/L (ref 22–32)
Calcium: 8.8 mg/dL — ABNORMAL LOW (ref 8.9–10.3)
Chloride: 102 mmol/L (ref 98–111)
Creatinine, Ser: 0.62 mg/dL (ref 0.44–1.00)
GFR, Estimated: >60 mL/min (ref >60)
Glucose, Bld: 91 mg/dL (ref 70–99)
Potassium: 3.5 mmol/L (ref 3.5–5.1)
Sodium: 135 mmol/L (ref 135–145)
Total Bilirubin: 0.4 mg/dL (ref 0.3–1.2)
Total Protein: 6.5 g/dL (ref 6.5–8.1)

## 2022-01-18 LAB — C-REACTIVE PROTEIN: CRP: 0.5 mg/dL (ref <1.0)

## 2022-01-18 LAB — IRON AND TIBC
Iron: 20 ug/dL — ABNORMAL LOW (ref 28–170)
Saturation Ratios: 5 % — ABNORMAL LOW (ref 10.4–31.8)
TIBC: 391 ug/dL (ref 250–450)
UIBC: 371 ug/dL

## 2022-01-18 LAB — FERRITIN: Ferritin: 9 ng/mL — ABNORMAL LOW (ref 11–307)

## 2022-01-18 LAB — SEDIMENTATION RATE: Sed Rate: 14 mm/hr (ref 0–22)

## 2022-01-18 MED ORDER — MELATONIN 3 MG PO TABS
3.0000 mg | ORAL_TABLET | Freq: Every day | ORAL | Status: DC
  Administered 2022-01-18 – 2022-01-19 (×2): 3 mg via ORAL
  Filled 2022-01-18 (×2): qty 1

## 2022-01-18 MED ORDER — DIPHENHYDRAMINE HCL 25 MG PO CAPS
25.0000 mg | ORAL_CAPSULE | Freq: Every day | ORAL | Status: DC
  Administered 2022-01-18 – 2022-01-19 (×2): 25 mg via ORAL
  Filled 2022-01-18 (×2): qty 1

## 2022-01-18 MED ORDER — MESALAMINE 1000 MG RE SUPP
1000.0000 mg | Freq: Every day | RECTAL | Status: DC
  Administered 2022-01-19 – 2022-01-20 (×2): 1000 mg via RECTAL
  Filled 2022-01-18 (×2): qty 1

## 2022-01-18 NOTE — Progress Notes (Addendum)
° °   Progress Note   Subjective  Chief Complaint: Anemia, diarrhea  Flex sig and endoscopy 01/28 with Dr. Ardis Hughs.  Patient was having 20+ stools when she first arrived the majority just blood. Yesterday patient had 5 stools, more formed stools less blood. Patient does note she has blood at the end of a bowel movement and worse with straining and pushing.  Denies rectal pain. Patient did have 4 bowel movements last night but states she was not sleeping well and these bowel movements did not wake her up was when she was awake. Patient has had some formed brown bowel movements. Patient denies fever, chills, nausea, vomiting.    Objective   Vital signs in last 24 hours: Temp:  [98 F (36.7 C)-98.2 F (36.8 C)] 98.2 F (36.8 C) (02/01 0640) Pulse Rate:  [83-100] 87 (02/01 0640) Resp:  [17-18] 17 (02/01 0640) BP: (117-122)/(76-79) 117/76 (02/01 0640) SpO2:  [96 %-98 %] 98 % (02/01 0640) Last BM Date: 01/18/22  General:   female in no acute distress  Abdomen:  Soft, Normal bowel sounds.  no  tenderness  Neurologic:  Alert and  oriented x4;  grossly normal neurologically.  Psychiatric: Cooperative. Normal mood and affect.  Intake/Output from previous day: 01/31 0701 - 02/01 0700 In: 741.2 [P.O.:600; I.V.:141.2] Out: -  Intake/Output this shift: No intake/output data recorded.  Lab Results: Recent Labs    01/16/22 0523 01/17/22 0501  WBC 13.0* 13.8*  HGB 11.9* 11.6*  HCT 37.8 38.4  PLT 391 448*   BMET No results for input(s): NA, K, CL, CO2, GLUCOSE, BUN, CREATININE, CALCIUM in the last 72 hours.  LFT No results for input(s): PROT, ALBUMIN, AST, ALT, ALKPHOS, BILITOT, BILIDIR, IBILI in the last 72 hours. PT/INR No results for input(s): LABPROT, INR in the last 72 hours.  Studies/Results: No results found.    Impression/Plan:   31 year old female with history of severe extensive UC, non responder to steroids, recently started on Humira.   Symptomatic anemia in  setting of ulcerative colitis 01/13/2022 CRP 0.8  01/16/2022 WBC 13.0 HGB 11.9 MCV 84.0, status post 4 units packed red blood cells  Normal upper endoscopy Day 6 of IV steroids, 3rd round Humira last dose on Friday Patient stating has mesalamine suppositories at home, history of internal hemorrhoids, blood appears more so at the end of a bowel movement and with straining, will start back on mesalamine suppositories at night. Pathology showed only active chronic colitis without dysplasia or malignancy.  Pending CMV. Pending Humira antibody and level Pending fecal cal Will get iron/ferritin to see if would benefit from iron infusion Patient clinically appears to be improving , will repeat CBC, CMET, CRP, sed rate today. With clinical improvement will switch patient from IV to p.o. prednisone today starting at 40 mg Still discussing if patient would benefit from increasing Humira to once weekly versus salvage therapy with Remicade or Rinvoq, will continue to monitor.     Future Appointments  Date Time Provider [place]  02/09/2022 11:00 AM Mauri Pole, MD [facility]      LOS: 3 days   [name]  01/18/2022, 9:21 AM

## 2022-01-18 NOTE — Progress Notes (Signed)
PROGRESS NOTE    Gloria Green  BJS:[id number] DOB: 1991/05/10 DOA: 01/12/2022 PCP: Alroy Dust, L.Marlou Sa, MD    Chief Complaint  Patient presents with   abnormal labs    Brief Narrative:  Gloria Green is an 31 y.o. female past medical history significant for ulcerative colitis on Humira presents to the emergency room with a low hemoglobin lab as an outpatient, fatigue and tachycardia, Hemoglobin on admission is 5 she status post 4 units of packed red blood cells and GI was consulted who performed an EGD that showed normal esophagus, and esophagus no fresh or old blood, some retained food without anatomic gastric outlet obstruction, Flex-sig showed friable mucosa and raw  Subjective:  Still has bloody diarrhea, some tachycardia, no ab pain, no n/v Reports not sleeping well, feel tired  Assessment & Plan:   Principal Problem:   Symptomatic anemia Active Problems:   UC (ulcerative colitis) (HCC)   GI bleeding   Hypokalemia   Symptomatic anemia/acute blood loss anemia/lower gi bleed /UC flares/immunosuppressed status -Status post PRBC transfusion -Status post EGD and sigmoid scope -Has been on IV steroid, transition to oral steroid today -Appreciate GI input, will follow GI recommendation  GI dysmotility Increase activity Small meals   Insomnia Possible steroids induced Reports benadryl works for her, wants it scheduled    The patients BMI is: Body mass index is 24.12 kg/m.Marland Kitchen      Unresulted Labs (From admission, onward)     Start     Ordered   01/19/22 0500  CBC with Differential/Platelet  Daily,   R     Question:  Specimen collection method  Answer:  Lab=Lab collect   01/18/22 0925   01/18/22 0927  Iron and TIBC  Once,   R       Question:  Specimen collection method  Answer:  Lab=Lab collect   01/18/22 0926   01/18/22 0927  Ferritin  Once,   R       Question:  Specimen collection method  Answer:  Lab=Lab collect   01/18/22 0926   01/18/22 0926   C-reactive protein  Every Mon-Wed-Fri,   R (with TIMED occurrences)     Question:  Specimen collection method  Answer:  Lab=Lab collect   01/18/22 0925   01/18/22 0926  Sedimentation rate  Every Mon-Wed-Fri,   R (with TIMED occurrences)     Question:  Specimen collection method  Answer:  Lab=Lab collect   01/18/22 0925   01/18/22 6073  Comprehensive metabolic panel  Once,   R       Question:  Specimen collection method  Answer:  Lab=Lab collect   01/18/22 0925   01/17/22 0936  Calprotectin, Fecal  Once,   R       Question:  Patient immune status  Answer:  Immunocompromised   01/17/22 0935   01/17/22 0500  Adalimumab Level and Antibody  Tomorrow morning,   R       Question:  Specimen collection method  Answer:  Lab=Lab collect   01/16/22 1602              DVT prophylaxis: SCDs Start: 01/12/22 2108   Code Status:full Family Communication: patient Disposition:   Status is: Inpatient  Dispo: The patient is from: home              Anticipated d/c is to: home              Anticipated d/c date is: needs GI clearance  Consultants:  GI  Procedures:  EGD/ flex sig  Antimicrobials:   Anti-infectives (From admission, onward)    None           Objective: Vitals:   01/17/22 0533 01/17/22 1632 01/17/22 2131 01/18/22 0640  BP: 108/78 122/79 120/78 117/76  Pulse: 87 100 83 87  Resp: 18 18 17 17   Temp: 98 F (36.7 C) 98.2 F (36.8 C) 98 F (36.7 C) 98.2 F (36.8 C)  TempSrc: Oral Oral Oral Oral  SpO2: 100% 96% 98% 98%  Weight:      Height:        Intake/Output Summary (Last 24 hours) at 01/18/2022 0942 Last data filed at 01/17/2022 2300 Gross per 24 hour  Intake 501.17 ml  Output --  Net 501.17 ml   Filed Weights   01/12/22 1749 01/12/22 2000 01/15/22 0521  Weight: 54.4 kg 54.4 kg 57.9 kg    Examination:  General exam: alert, awake, communicative,calm, NAD Respiratory system: Clear to auscultation. Respiratory effort  normal. Cardiovascular system:  RRR.  Gastrointestinal system: Abdomen is nondistended, soft and nontender.  Normal bowel sounds heard. Central nervous system: Alert and oriented. No focal neurological deficits. Extremities:  no edema Skin: No rashes, lesions or ulcers Psychiatry: Judgement and insight appear normal. Mood & affect appropriate.     Data Reviewed: I have personally reviewed following labs and imaging studies  CBC: Recent Labs  Lab 01/12/22 1532 01/12/22 1830 01/13/22 0932 01/14/22 0550 01/15/22 0559 01/16/22 0523 01/17/22 0501  WBC 10.1 9.4 11.1* 14.7* 11.9* 13.0* 13.8*  NEUTROABS 4.3 3.7 4.3 9.0*  --   --  7.5  HGB 5.5 Repeated and verified X2.* 5.4* 10.2* 11.0* 10.8* 11.9* 11.6*  HCT 18.3 Repeated and verified X2.* 19.5* 32.0* 35.0* 35.2* 37.8 38.4  MCV 70.6* 76.8* 81.6 83.1 84.4 84.0 85.7  PLT 394.0 445* 294 366 371 391 448*    Basic Metabolic Panel: Recent Labs  Lab 01/12/22 1532 01/12/22 1830 01/13/22 0900 01/14/22 0550 01/15/22 0559  NA 142 138 138 135 137  K 3.9 3.1* 3.6 3.9 4.0  CL 109 107 112* 107 107  CO2 28 26 22 23 23   GLUCOSE 92 92 76 111* 95  BUN 10 12 9 9 11   CREATININE 0.81 0.81 0.61 0.50 0.66  CALCIUM 8.1* 8.3* 7.3* 7.7* 8.5*  MG  --   --  1.4* 2.4  --     GFR: Estimated Creatinine Clearance: 82.6 mL/min (by C-G formula based on SCr of 0.66 mg/dL).  Liver Function Tests: Recent Labs  Lab 01/12/22 1532 01/12/22 1830  AST 11 14*  ALT 8 12  ALKPHOS 37* 38  BILITOT 0.3 0.4  PROT 5.8* 6.1*  ALBUMIN 2.8* 2.5*    CBG: No results for input(s): GLUCAP in the last 168 hours.   Recent Results (from the past 240 hour(s))  Resp Panel by RT-PCR (Flu A&B, Covid) Nasopharyngeal Swab     Status: None   Collection Time: 01/12/22  8:34 PM   Specimen: Nasopharyngeal Swab; Nasopharyngeal(NP) swabs in vial transport medium  Result Value Ref Range Status   SARS Coronavirus 2 by RT PCR NEGATIVE NEGATIVE Final    Comment:  (NOTE) SARS-CoV-2 target nucleic acids are NOT DETECTED.  The SARS-CoV-2 RNA is generally detectable in upper respiratory specimens during the acute phase of infection. The lowest concentration of SARS-CoV-2 viral copies this assay can detect is 138 copies/mL. A negative result does not preclude SARS-Cov-2 infection and should not be used as  the sole basis for treatment or other patient management decisions. A negative result may occur with  improper specimen collection/handling, submission of specimen other than nasopharyngeal swab, presence of viral mutation(s) within the areas targeted by this assay, and inadequate number of viral copies(<138 copies/mL). A negative result must be combined with clinical observations, patient history, and epidemiological information. The expected result is Negative.  Fact Sheet for Patients:  EntrepreneurPulse.com.au  Fact Sheet for Healthcare Providers:  IncredibleEmployment.be  This test is no t yet approved or cleared by the [place] FDA and  has been authorized for detection and/or diagnosis of SARS-CoV-2 by FDA under an Emergency Use Authorization (EUA). This EUA will remain  in effect (meaning this test can be used) for the duration of the COVID-19 declaration under Section 564(b)(1) of the Act, 21 U.S.C.section 360bbb-3(b)(1), unless the authorization is terminated  or revoked sooner.       Influenza A by PCR NEGATIVE NEGATIVE Final   Influenza B by PCR NEGATIVE NEGATIVE Final    Comment: (NOTE) The Xpert Xpress SARS-CoV-2/FLU/RSV plus assay is intended as an aid in the diagnosis of influenza from Nasopharyngeal swab specimens and should not be used as a sole basis for treatment. Nasal washings and aspirates are unacceptable for Xpert Xpress SARS-CoV-2/FLU/RSV testing.  Fact Sheet for Patients: EntrepreneurPulse.com.au  Fact Sheet for Healthcare  Providers: IncredibleEmployment.be  This test is not yet approved or cleared by the [place] FDA and has been authorized for detection and/or diagnosis of SARS-CoV-2 by FDA under an Emergency Use Authorization (EUA). This EUA will remain in effect (meaning this test can be used) for the duration of the COVID-19 declaration under Section 564(b)(1) of the Act, 21 U.S.C. section 360bbb-3(b)(1), unless the authorization is terminated or revoked.  Performed at [facility], [place] [address], [place], [place] [address]   C Difficile Quick Screen w PCR reflex     Status: None   Collection Time: 01/13/22  2:33 PM   Specimen: STOOL  Result Value Ref Range Status   C Diff antigen NEGATIVE NEGATIVE Final   C Diff toxin NEGATIVE NEGATIVE Final   C Diff interpretation No C. difficile detected.  Final    Comment: Performed at [facility], [place] [address] [place], [place] [address]  Virus culture     Status: None   Collection Time: 01/14/22 10:10 AM   Specimen: PATH GI biopsy; Tissue  Result Value Ref Range Status   Viral Culture Comment  Final    Comment: (NOTE) Preliminary Report: No virus isolated at 24 hours. Next report to follow after 4 days. Performed At: [facility] [place], [place] [id number] Rush Farmer MD QM:[id number]          Radiology Studies: No results found.      Scheduled Meds:  citalopram  20 mg Oral QHS   diphenhydrAMINE  25 mg Oral QHS   melatonin  3 mg Oral QHS   mesalamine  1,000 mg Rectal QHS   predniSONE  40 mg Oral QAC breakfast   sodium chloride flush  3 mL Intravenous Q12H   Continuous Infusions:  dextrose 5 % and 0.45% NaCl Stopped (01/18/22 0200)     LOS: 3 days    Greater than 50% of this time was spent in counseling, explanation of diagnosis, planning of further management, and coordination of care.   Voice Recognition Viviann Spare dictation system was  used to create this note, attempts have been made to correct  errors. Please contact the author with questions and/or clarifications.   Florencia Reasons, MD PhD FACP [facility]  Available via Epic secure chat 7am-7pm for nonurgent issues Please page for urgent issues To page the attending provider between 7A-7P or the covering provider during after hours 7P-7A, please log into the web site [email or web address] and access using universal [place] password for that web site. If you do not have the password, please call the hospital operator.    01/18/2022, 9:42 AM

## 2022-01-19 ENCOUNTER — Telehealth: Payer: Self-pay

## 2022-01-19 DIAGNOSIS — D500 Iron deficiency anemia secondary to blood loss (chronic): Secondary | ICD-10-CM

## 2022-01-19 DIAGNOSIS — D649 Anemia, unspecified: Secondary | ICD-10-CM

## 2022-01-19 DIAGNOSIS — K5100 Ulcerative (chronic) pancolitis without complications: Secondary | ICD-10-CM

## 2022-01-19 DIAGNOSIS — K922 Gastrointestinal hemorrhage, unspecified: Secondary | ICD-10-CM

## 2022-01-19 LAB — CBC WITH DIFFERENTIAL/PLATELET
Abs Immature Granulocytes: 0.12 10*3/uL — ABNORMAL HIGH (ref 0.00–0.07)
Basophils Absolute: 0.1 10*3/uL (ref 0.0–0.1)
Basophils Relative: 0 %
Eosinophils Absolute: 0.3 10*3/uL (ref 0.0–0.5)
Eosinophils Relative: 2 %
HCT: 36 % (ref 36.0–46.0)
Hemoglobin: 10.9 g/dL — ABNORMAL LOW (ref 12.0–15.0)
Immature Granulocytes: 1 %
Lymphocytes Relative: 33 %
Lymphs Abs: 5 10*3/uL — ABNORMAL HIGH (ref 0.7–4.0)
MCH: 25.6 pg — ABNORMAL LOW (ref 26.0–34.0)
MCHC: 30.3 g/dL (ref 30.0–36.0)
MCV: 84.7 fL (ref 80.0–100.0)
Monocytes Absolute: 1.7 10*3/uL — ABNORMAL HIGH (ref 0.1–1.0)
Monocytes Relative: 11 %
Neutro Abs: 8 10*3/uL — ABNORMAL HIGH (ref 1.7–7.7)
Neutrophils Relative %: 53 %
Platelets: 365 10*3/uL (ref 150–400)
RBC: 4.25 MIL/uL (ref 3.87–5.11)
RDW: 19.3 % — ABNORMAL HIGH (ref 11.5–15.5)
WBC: 15.1 10*3/uL — ABNORMAL HIGH (ref 4.0–10.5)
nRBC: 0 % (ref 0.0–0.2)

## 2022-01-19 LAB — BASIC METABOLIC PANEL
Anion gap: 4 — ABNORMAL LOW (ref 5–15)
BUN: 20 mg/dL (ref 6–20)
CO2: 24 mmol/L (ref 22–32)
Calcium: 8.3 mg/dL — ABNORMAL LOW (ref 8.9–10.3)
Chloride: 107 mmol/L (ref 98–111)
Creatinine, Ser: 0.77 mg/dL (ref 0.44–1.00)
GFR, Estimated: >60 mL/min (ref >60)
Glucose, Bld: 82 mg/dL (ref 70–99)
Potassium: 3.7 mmol/L (ref 3.5–5.1)
Sodium: 135 mmol/L (ref 135–145)

## 2022-01-19 LAB — SURGICAL PATHOLOGY

## 2022-01-19 LAB — CALPROTECTIN, FECAL: Calprotectin, Fecal: 1548 ug/g — ABNORMAL HIGH (ref 0–120)

## 2022-01-19 LAB — MAGNESIUM: Magnesium: 1.8 mg/dL (ref 1.7–2.4)

## 2022-01-19 MED ORDER — FERROUS SULFATE 325 (65 FE) MG PO TABS
325.0000 mg | ORAL_TABLET | ORAL | Status: DC
  Administered 2022-01-19: 325 mg via ORAL
  Filled 2022-01-19: qty 1

## 2022-01-19 MED ORDER — POTASSIUM CHLORIDE 20 MEQ PO PACK
40.0000 meq | PACK | Freq: Once | ORAL | Status: AC
  Administered 2022-01-19: 40 meq via ORAL
  Filled 2022-01-19: qty 2

## 2022-01-19 MED ORDER — SODIUM CHLORIDE 0.9 % IV SOLN
510.0000 mg | Freq: Once | INTRAVENOUS | Status: AC
  Administered 2022-01-19: 510 mg via INTRAVENOUS
  Filled 2022-01-19: qty 17

## 2022-01-19 MED ORDER — MAGNESIUM SULFATE 2 GM/50ML IV SOLN
2.0000 g | Freq: Once | INTRAVENOUS | Status: AC
  Administered 2022-01-19: 2 g via INTRAVENOUS
  Filled 2022-01-19: qty 50

## 2022-01-19 NOTE — Telephone Encounter (Signed)
Patient is already scheduled for a my chart video visit with Dr. Silverio Decamp on Thursday, 02/09/22 at 11 am.   Lab orders in epic. Lab reminder sent to [facility] LPN.

## 2022-01-19 NOTE — Progress Notes (Addendum)
° °   Progress Note   Subjective  Chief Complaint: Anemia and diarrhea  Flex sig and endoscopy 1/28 with Dr. Ardis Hughs  Patient was having 24 the stools when she first arrived, the majority just blood, yesterday had 8 stools and the day before 5.  In general the stools are firming up and there is less blood.  Tells me she only has generalized abdominal cramping prior to a bowel movement which is resolved afterwards.  Otherwise she feels fine.  She and her husband have a lot of questions in regards to labs recently and whether or not the plans are for her to be discharged tomorrow morning.   Objective   Vital signs in last 24 hours: Temp:  [97.9 F (36.6 C)-98.4 F (36.9 C)] 98.2 F (36.8 C) (02/02 0530) Pulse Rate:  [89-102] 89 (02/02 0530) Resp:  [16-18] 18 (02/02 0530) BP: (111-119)/(67-74) 119/73 (02/02 0530) SpO2:  [97 %-99 %] 99 % (02/02 0530) Last BM Date: 01/18/22 General:    white female in NAD Heart:  Regular rate and rhythm; no murmurs Lungs: Respirations even and unlabored, lungs CTA bilaterally Abdomen:  Soft, nontender and nondistended. Normal bowel sounds. Psych:  Cooperative. Normal mood and affect.  Intake/Output from previous day: 02/01 0701 - 02/02 0700 In: 960 [P.O.:960] Out: -   Lab Results: Recent Labs    01/17/22 0501 01/19/22 0545  WBC 13.8* 15.1*  HGB 11.6* 10.9*  HCT 38.4 36.0  PLT 448* 365   BMET Recent Labs    01/18/22 0938 01/19/22 0545  NA 135 135  K 3.5 3.7  CL 102 107  CO2 24 24  GLUCOSE 91 82  BUN 14 20  CREATININE 0.62 0.77  CALCIUM 8.8* 8.3*   LFT Recent Labs    01/18/22 0938  PROT 6.5  ALBUMIN 2.7*  AST 10*  ALT 12  ALKPHOS 43  BILITOT 0.4    Assessment / Plan:    Assessment: 1.  Severe extensive UC: With anemia, hemoglobin 11.6 status post 4 units PRBCs, iron and ferritin low, initially with 6 days of IV steroids, transition to p.o. prednisone yesterday 40 mg, Humira antibody and level pending  Plan: 1.   Discussed results from fecal calprotectin with the patient.  This elevated level is not to be unexpected in the setting of severe UC.  Also discussed low albumin level and iron levels. 2.  Ordered IV Faraheme for the patient today and started her on oral iron 98m to be taken eod. 3.  Explained to patient and her husband that she is improving and doing well with the oral steroids, likely she will be able to be discharged tomorrow. 4.  CMV immunostains are still pending 5.  Plan is that when the Humira antibodies come back within a week or so then can consider from there whether the patient may require Humira weekly versus transitioning of biologic 6.  Continue current diet 7.  Encouraged patient to continue to monitor her stools and symptoms  Thank you for your kind consultation, we will continue to follow.    LOS: 4 days   JLevin Erp 01/19/2022, 9:26 AM   Addendum:  Viral testing returned negative after I saw the patient. We will let her know before she leaves.  JEllouise Newer PA-C

## 2022-01-19 NOTE — Progress Notes (Signed)
PROGRESS NOTE    Gloria Green  [id number]:[id number] DOB: [birth year] DOA: 01/12/2022 PCP: Alroy Dust, L.Marlou Sa, MD    Chief Complaint  Patient presents with   abnormal labs    Brief Narrative:  Gloria Green is an 31 y.o. female past medical history significant for ulcerative colitis on Humira presents to the emergency room with a low hemoglobin lab as an outpatient, fatigue and tachycardia, Hemoglobin on admission is 5 she status post 4 units of packed red blood cells and GI was consulted who performed an EGD that showed normal esophagus, and esophagus no fresh or old blood, some retained food without anatomic gastric outlet obstruction, Flex-sig showed friable mucosa and raw  Subjective:  Denies pain , reports stool start to become form and less bloody   Assessment & Plan:   Principal Problem:   Symptomatic anemia Active Problems:   UC (ulcerative colitis) (HCC)   GI bleeding   Hypokalemia   Symptomatic anemia/acute blood loss anemia/lower gi bleed /UC flares/immunosuppressed status -Status post PRBC transfusion, getting iv iron today -Status post EGD and sigmoid scope -Has been on IV steroid, now on oral steroid  -Appreciate GI input, will follow GI recommendation  Hypokalemia/hypomagnesemia Replace K and mag  Sinus tachycardia Asymptomatic, intermittent Check TSH Keep on telemetry  GI dysmotility Increase activity Small meals   Insomnia Possible steroids induced Reports benadryl works for her, wants it scheduled    The patients BMI is: Body mass index is 24.12 kg/m.Marland Kitchen      Unresulted Labs (From admission, onward)     Start     Ordered   01/20/22 0500  TSH  Tomorrow morning,   R       Question:  Specimen collection method  Answer:  Lab=Lab collect   01/19/22 1458   01/19/22 0500  CBC with Differential/Platelet  Daily,   R     Question:  Specimen collection method  Answer:  Lab=Lab collect   01/18/22 0925   01/18/22 0926  C-reactive protein   Every Mon-Wed-Fri,   R     Question:  Specimen collection method  Answer:  Lab=Lab collect   01/18/22 0925   01/18/22 0926  Sedimentation rate  Every Mon-Wed-Fri,   R     Question:  Specimen collection method  Answer:  Lab=Lab collect   01/18/22 0925   01/17/22 0500  Adalimumab Level and Antibody  Tomorrow morning,   R       Question:  Specimen collection method  Answer:  Lab=Lab collect   01/16/22 1602              DVT prophylaxis: SCDs Start: 01/12/22 2108   Code Status:full Family Communication: patient Disposition:   Status is: Inpatient  Dispo: The patient is from: home              Anticipated d/c is to: home              Anticipated d/c date is: needs GI clearance, possible 2/3                Consultants:  GI  Procedures:  EGD/ flex sig PRBC transfusion IV iron  Antimicrobials:   Anti-infectives (From admission, onward)    None           Objective: Vitals:   01/18/22 2115 01/19/22 0530 01/19/22 1406 01/19/22 1408  BP: 118/74 119/73 115/78   Pulse: 89 89 (!) 120 (!) 124  Resp: 18 18 18    Temp: 97.9 F (  36.6 C) 98.2 F (36.8 C) 98.5 F (36.9 C)   TempSrc: Oral Oral Oral   SpO2: 98% 99% 99%   Weight:      Height:        Intake/Output Summary (Last 24 hours) at 01/19/2022 1505 Last data filed at 01/19/2022 0955 Gross per 24 hour  Intake 720 ml  Output --  Net 720 ml   Filed Weights   01/12/22 1749 01/12/22 2000 01/15/22 0521  Weight: 54.4 kg 54.4 kg 57.9 kg    Examination:  General exam: alert, awake, communicative,calm, NAD Respiratory system: Clear to auscultation. Respiratory effort normal. Cardiovascular system:  RRR.  Gastrointestinal system: Abdomen is nondistended, soft and nontender.  Normal bowel sounds heard. Central nervous system: Alert and oriented. No focal neurological deficits. Extremities:  no edema Skin: No rashes, lesions or ulcers Psychiatry: Judgement and insight appear normal. Mood & affect appropriate.      Data Reviewed: I have personally reviewed following labs and imaging studies  CBC: Recent Labs  Lab 01/12/22 1830 01/13/22 0932 01/14/22 0550 01/15/22 0559 01/16/22 0523 01/17/22 0501 01/19/22 0545  WBC 9.4 11.1* 14.7* 11.9* 13.0* 13.8* 15.1*  NEUTROABS 3.7 4.3 9.0*  --   --  7.5 8.0*  HGB 5.4* 10.2* 11.0* 10.8* 11.9* 11.6* 10.9*  HCT 19.5* 32.0* 35.0* 35.2* 37.8 38.4 36.0  MCV 76.8* 81.6 83.1 84.4 84.0 85.7 84.7  PLT 445* 294 366 371 391 448* 546    Basic Metabolic Panel: Recent Labs  Lab 01/13/22 0900 01/14/22 0550 01/15/22 0559 01/18/22 0938 01/19/22 0545  NA 138 135 137 135 135  K 3.6 3.9 4.0 3.5 3.7  CL 112* 107 107 102 107  CO2 22 23 23 24 24   GLUCOSE 76 111* 95 91 82  BUN 9 9 11 14 20   CREATININE 0.61 0.50 0.66 0.62 0.77  CALCIUM 7.3* 7.7* 8.5* 8.8* 8.3*  MG 1.4* 2.4  --   --  1.8    GFR: Estimated Creatinine Clearance: 82.6 mL/min (by C-G formula based on SCr of 0.77 mg/dL).  Liver Function Tests: Recent Labs  Lab 01/12/22 1532 01/12/22 1830 01/18/22 0938  AST 11 14* 10*  ALT 8 12 12   ALKPHOS 37* 38 43  BILITOT 0.3 0.4 0.4  PROT 5.8* 6.1* 6.5  ALBUMIN 2.8* 2.5* 2.7*    CBG: No results for input(s): GLUCAP in the last 168 hours.   Recent Results (from the past 240 hour(s))  Resp Panel by RT-PCR (Flu A&B, Covid) Nasopharyngeal Swab     Status: None   Collection Time: 01/12/22  8:34 PM   Specimen: Nasopharyngeal Swab; Nasopharyngeal(NP) swabs in vial transport medium  Result Value Ref Range Status   SARS Coronavirus 2 by RT PCR NEGATIVE NEGATIVE Final    Comment: (NOTE) SARS-CoV-2 target nucleic acids are NOT DETECTED.  The SARS-CoV-2 RNA is generally detectable in upper respiratory specimens during the acute phase of infection. The lowest concentration of SARS-CoV-2 viral copies this assay can detect is 138 copies/mL. A negative result does not preclude SARS-Cov-2 infection and should not be used as the sole basis for treatment  or other patient management decisions. A negative result may occur with  improper specimen collection/handling, submission of specimen other than nasopharyngeal swab, presence of viral mutation(s) within the areas targeted by this assay, and inadequate number of viral copies(<138 copies/mL). A negative result must be combined with clinical observations, patient history, and epidemiological information. The expected result is Negative.  Fact Sheet for Patients:  EntrepreneurPulse.com.au  Fact Sheet for Healthcare Providers:  IncredibleEmployment.be  This test is no t yet approved or cleared by the [place] FDA and  has been authorized for detection and/or diagnosis of SARS-CoV-2 by FDA under an Emergency Use Authorization (EUA). This EUA will remain  in effect (meaning this test can be used) for the duration of the COVID-19 declaration under Section 564(b)(1) of the Act, 21 U.S.C.section 360bbb-3(b)(1), unless the authorization is terminated  or revoked sooner.       Influenza A by PCR NEGATIVE NEGATIVE Final   Influenza B by PCR NEGATIVE NEGATIVE Final    Comment: (NOTE) The Xpert Xpress SARS-CoV-2/FLU/RSV plus assay is intended as an aid in the diagnosis of influenza from Nasopharyngeal swab specimens and should not be used as a sole basis for treatment. Nasal washings and aspirates are unacceptable for Xpert Xpress SARS-CoV-2/FLU/RSV testing.  Fact Sheet for Patients: EntrepreneurPulse.com.au  Fact Sheet for Healthcare Providers: IncredibleEmployment.be  This test is not yet approved or cleared by the [place] FDA and has been authorized for detection and/or diagnosis of SARS-CoV-2 by FDA under an Emergency Use Authorization (EUA). This EUA will remain in effect (meaning this test can be used) for the duration of the COVID-19 declaration under Section 564(b)(1) of the Act, 21 U.S.C. section  360bbb-3(b)(1), unless the authorization is terminated or revoked.  Performed at [facility], [place] [address], [place], [place] [address]   C Difficile Quick Screen w PCR reflex     Status: None   Collection Time: 01/13/22  2:33 PM   Specimen: STOOL  Result Value Ref Range Status   C Diff antigen NEGATIVE NEGATIVE Final   C Diff toxin NEGATIVE NEGATIVE Final   C Diff interpretation No C. difficile detected.  Final    Comment: Performed at [facility], [place] [address], [place], [place] [address]  Virus culture     Status: None   Collection Time: 01/14/22 10:10 AM   Specimen: PATH GI biopsy; Tissue  Result Value Ref Range Status   Viral Culture Comment  Final    Comment: (NOTE) Preliminary Report: No virus isolated at 4 days.  Next report to follow after 7 days. Performed At: [facility] [address] [place], [place] [id number] Rush Farmer MD [id number]   Calprotectin, Fecal     Status: Abnormal   Collection Time: 01/17/22 12:10 PM   Specimen: Stool  Result Value Ref Range Status   Calprotectin, Fecal 1,548 (H) 0 - 120 ug/g Final    Comment: (NOTE) Concentration     Interpretation   Follow-Up <16 - 50 ug/g     Normal           None >50 -120 ug/g     Borderline       Re-evaluate in 4-6 weeks    >120 ug/g     Abnormal         Repeat as clinically                                   indicated Performed At: [facility] [place], [place] [id number] Rush Farmer MD DX:[id number]          Radiology Studies: No results found.      Scheduled Meds:  citalopram  20 mg Oral QHS   diphenhydrAMINE  25 mg Oral QHS   ferrous sulfate  325 mg Oral  QODAY   melatonin  3 mg Oral QHS   mesalamine  1,000 mg Rectal QHS   potassium chloride  40 mEq Oral Once   predniSONE  40 mg Oral QAC breakfast   sodium chloride flush  3 mL Intravenous Q12H   Continuous Infusions:  dextrose 5 % and 0.45% NaCl Stopped  (01/18/22 0200)   magnesium sulfate bolus IVPB       LOS: 4 days    Greater than 50% of this time was spent in counseling, explanation of diagnosis, planning of further management, and coordination of care.   Voice Recognition Viviann Spare dictation system was used to create this note, attempts have been made to correct errors. Please contact the author with questions and/or clarifications.   Florencia Reasons, MD PhD FACP [facility]  Available via Epic secure chat 7am-7pm for nonurgent issues Please page for urgent issues To page the attending provider between 7A-7P or the covering provider during after hours 7P-7A, please log into the web site [email or web address] and access using universal [place] password for that web site. If you do not have the password, please call the hospital operator.    01/19/2022, 3:05 PM

## 2022-01-19 NOTE — Telephone Encounter (Signed)
-----  Message from Levin Erp, [place] sent at 01/19/2022  2:49 PM EST ----- Regarding: Follow up and labs Can you please get patient follow up with Dr. Silverio Decamp or Vicie Mutters in 2-3 weeks. Follow up UC  Also needs labs next Thursday - CBC, CMP, High sensitivity CRP and ESR  Thanks-JLL

## 2022-01-20 ENCOUNTER — Other Ambulatory Visit: Payer: Self-pay

## 2022-01-20 ENCOUNTER — Telehealth: Payer: Self-pay | Admitting: Gastroenterology

## 2022-01-20 DIAGNOSIS — K625 Hemorrhage of anus and rectum: Secondary | ICD-10-CM

## 2022-01-20 DIAGNOSIS — K649 Unspecified hemorrhoids: Secondary | ICD-10-CM

## 2022-01-20 LAB — CBC WITH DIFFERENTIAL/PLATELET
Abs Immature Granulocytes: 0.13 10*3/uL — ABNORMAL HIGH (ref 0.00–0.07)
Basophils Absolute: 0 10*3/uL (ref 0.0–0.1)
Basophils Relative: 0 %
Eosinophils Absolute: 0.3 10*3/uL (ref 0.0–0.5)
Eosinophils Relative: 2 %
HCT: 33.7 % — ABNORMAL LOW (ref 36.0–46.0)
Hemoglobin: 10.4 g/dL — ABNORMAL LOW (ref 12.0–15.0)
Immature Granulocytes: 1 %
Lymphocytes Relative: 34 %
Lymphs Abs: 4.5 10*3/uL — ABNORMAL HIGH (ref 0.7–4.0)
MCH: 25.9 pg — ABNORMAL LOW (ref 26.0–34.0)
MCHC: 30.9 g/dL (ref 30.0–36.0)
MCV: 83.8 fL (ref 80.0–100.0)
Monocytes Absolute: 1.5 10*3/uL — ABNORMAL HIGH (ref 0.1–1.0)
Monocytes Relative: 11 %
Neutro Abs: 6.8 10*3/uL (ref 1.7–7.7)
Neutrophils Relative %: 52 %
Platelets: 350 10*3/uL (ref 150–400)
RBC: 4.02 MIL/uL (ref 3.87–5.11)
RDW: 18.7 % — ABNORMAL HIGH (ref 11.5–15.5)
WBC: 13.2 10*3/uL — ABNORMAL HIGH (ref 4.0–10.5)
nRBC: 0 % (ref 0.0–0.2)

## 2022-01-20 LAB — SEDIMENTATION RATE: Sed Rate: 5 mm/hr (ref 0–22)

## 2022-01-20 LAB — C-REACTIVE PROTEIN: CRP: 1.7 mg/dL — ABNORMAL HIGH (ref <1.0)

## 2022-01-20 LAB — TSH: TSH: 0.853 u[IU]/mL (ref 0.350–4.500)

## 2022-01-20 MED ORDER — FERROUS SULFATE 325 (65 FE) MG PO TABS: 325.0000 mg | ORAL_TABLET | ORAL | 3 refills | Status: DC

## 2022-01-20 MED ORDER — MESALAMINE 1000 MG RE SUPP: 1000.0000 mg | Freq: Every day | RECTAL | 1 refills | Status: DC

## 2022-01-20 MED ORDER — PREDNISONE 20 MG PO TABS: 40.0000 mg | ORAL_TABLET | Freq: Every day | ORAL | Status: DC

## 2022-01-20 MED ORDER — PREDNISONE 20 MG PO TABS: 20.0000 mg | ORAL_TABLET | Freq: Every day | ORAL | Status: DC

## 2022-01-20 NOTE — Telephone Encounter (Signed)
Patient called and stated that she had just got out of the hospital and has some questions about her discharge paperwork. Seeking advice, please advise.

## 2022-01-20 NOTE — Discharge Summary (Signed)
Discharge Summary  Gloria Green WIO:[id number] DOB: [birth year]  PCP: Alroy Dust, L.Marlou Sa, MD  Admit date: 01/12/2022 Discharge date: 01/20/2022  Time spent: 63mns  Recommendations for Outpatient Follow-up:  F/u with GI and pcp for hospital discharge follow up    Discharge Diagnoses:  Active Hospital Problems   Diagnosis Date Noted   Symptomatic anemia 01/12/2022   GI bleeding 01/12/2022   Hypokalemia 01/12/2022   [place] [place]    Resolved Hospital Problems  No resolved problems to display.    Discharge Condition: stable  Diet recommendation: regular diet  Filed Weights   01/12/22 1749 01/12/22 2000 01/15/22 0521  Weight: 54.4 kg 54.4 kg 57.9 kg    History of present illness: ( per admitting MD Dr OMyna Hidalgo Chief Complaint: Low Hgb, swelling, fatigue, tachycardia    HPI: LKarissa Meenanis a pleasant 31y.o. female with medical history significant for ulcerative colitis on Humira, now presenting to the emergency department for evaluation of low hemoglobin on outpatient labs, fatigue, tachycardia, and swelling.  The patient reports approximately 4 months of GI bleeding and approximately 2 weeks of increased fatigue and resting tachycardia.  She has had bright red blood per rectum for the past 4 months but has also had some very dark stool over the past couple days.  She denies any recent fevers or chills and reports that her chronic abdominal discomfort has not changed recently.  She had been on prednisone but completed a taper within the past couple weeks.  She reports recent bilateral lower leg swelling and also felt that her hands and face may have been swollen this morning.  She denies vomiting.  She called her GI clinic with these complaints, blood work was obtained, and she was directed to the ED for evaluation of hemoglobin 5.5.   ED Course: Upon arrival to the ED, patient is found to be afebrile, saturating well on room air, tachycardic  to 110s, and with stable blood pressure.  CBC notable for hemoglobin 5.4 with MCV 76.8.  Chemistry panel features and albumin of 2.5 and potassium 3.1.  GI was consulted by the ED physician and 2 units RBC were ordered for transfusion.  Hospital Course:  Principal Problem:   Symptomatic anemia Active Problems:   UC (ulcerative colitis) (HCC)   GI bleeding   Hypokalemia   Symptomatic anemia/acute blood loss anemia/lower gi bleed /UC flares/immunosuppressed status -Status post PRBC transfusion and  iv iron today -Status post EGD and sigmoid scope -Has been on IV steroid, now on oral steroid  -she has improved , she is cleared to go home by GI, GI recommend discharge on oral prednisone and oral iron, she thinks Msealamine suppositories helped and would like to continue -she is to follow up with GI next week to repeat labs, arranged by GI -she is to follow up with GI closely    Hypokalemia/hypomagnesemia Replaced and improved   Sinus tachycardia, possible reactive Asymptomatic, intermittent  TSH wnl F/u with pcp    Discharge Exam: BP 117/70 (BP Location: Right Arm)    Pulse 76    Temp 98.4 F (36.9 C) (Oral)    Resp 18    Ht 5' 1"  (1.549 m)    Wt 57.9 kg    SpO2 99%    Breastfeeding Yes    BMI 24.12 kg/m   General: NAD, pleasant Cardiovascular: RRR Respiratory: normal respiratory effort     Discharge Instructions     Diet general   Complete  by: As directed    Increase activity slowly   Complete by: As directed       Allergies as of 01/20/2022       Reactions   Sulfa Antibiotics Hives   Latex Rash   Wound Dressing Adhesive Rash   Skin Tearing        Medication List     STOP taking these medications    Humira Pen 40 MG/0.4ML Pnkt Generic drug: Adalimumab   hydrocortisone-pramoxine 2.5-1 % rectal cream Commonly known as: Analpram HC   sucralfate 1 g tablet Commonly known as: Carafate   vancomycin 125 MG capsule Commonly known as: VANCOCIN        TAKE these medications    citalopram 20 MG tablet Commonly known as: CELEXA Take 20 mg by mouth daily.   dicyclomine 20 MG tablet Commonly known as: BENTYL Take 1 tablet (20 mg total) by mouth 2 (two) times daily as needed for up to 5 days for spasms.   diphenoxylate-atropine 2.5-0.025 MG tablet Commonly known as: LOMOTIL Take 1 tablet by mouth daily as needed for diarrhea or loose stools.   ferrous sulfate 325 (65 FE) MG tablet Take 1 tablet (325 mg total) by mouth every other day. Start taking on: January 21, 2022   loperamide 2 MG capsule Commonly known as: IMODIUM Take 2 mg by mouth daily as needed for diarrhea or loose stools.   mesalamine 1000 MG suppository Commonly known as: CANASA Place 1 suppository (1,000 mg total) rectally at bedtime.   ondansetron 4 MG tablet Commonly known as: Zofran Take 1 tablet (4 mg total) by mouth daily as needed for nausea or vomiting.   predniSONE 20 MG tablet Commonly known as: DELTASONE Take 1 tablet (20 mg total) by mouth daily with breakfast. 40 mg daily What changed:  how much to take how to take this when to take this additional instructions Another medication with the same name was removed. Continue taking this medication, and follow the directions you see here.   promethazine 12.5 MG tablet Commonly known as: PHENERGAN Take 1 tablet (12.5 mg total) by mouth daily as needed for nausea or vomiting.       Allergies  Allergen Reactions   Sulfa Antibiotics Hives   Latex Rash   Wound Dressing Adhesive Rash    Skin Tearing    Follow-up Information     Mitchell, L.Marlou Sa, MD Follow up.   Specialty: Family Medicine Contact information: [address] [place] [address] [phone]                  The results of significant diagnostics from this hospitalization (including imaging, microbiology, ancillary and laboratory) are listed below for reference.    Significant Diagnostic  Studies: CT CHEST ABDOMEN PELVIS W CONTRAST  Result Date: 01/13/2022 CLINICAL DATA:  Ulcerative colitis, bright red blood per rectum, anemia, lower extremity edema EXAM: CT CHEST, ABDOMEN, AND PELVIS WITH CONTRAST TECHNIQUE: Multidetector CT imaging of the chest, abdomen and pelvis was performed following the standard protocol during bolus administration of intravenous contrast. RADIATION DOSE REDUCTION: This exam was performed according to the departmental dose-optimization program which includes automated exposure control, adjustment of the mA and/or kV according to patient size and/or use of iterative reconstruction technique. CONTRAST:  125m OMNIPAQUE IOHEXOL 300 MG/ML  SOLN COMPARISON:  10/16/2021 FINDINGS: CT CHEST FINDINGS Cardiovascular: The heart and great vessels are unremarkable without pericardial effusion. No evidence of thoracic aortic aneurysm or dissection. Mediastinum/Nodes: No enlarged  mediastinal, hilar, or axillary lymph nodes. Thyroid gland, trachea, and esophagus demonstrate no significant findings. Lungs/Pleura: No acute airspace disease, effusion, or pneumothorax. Central airways are patent. 4 mm left upper lobe solid pulmonary nodule reference image 58/3. No other nodules or masses. Musculoskeletal: No acute or destructive bony lesions. Reconstructed images demonstrate no additional findings. CT ABDOMEN PELVIS FINDINGS Hepatobiliary: No focal liver abnormality is seen. No gallstones, gallbladder wall thickening, or biliary dilatation. Pancreas: Unremarkable. No pancreatic ductal dilatation or surrounding inflammatory changes. Spleen: Borderline splenomegaly, measuring 8.8 x 4.3 x 13.7 cm corresponding to 518 cc. No focal parenchymal abnormality. Adrenals/Urinary Tract: Grossly stable right adrenal adenoma measuring approximately 1.8 x 1.1 cm. The left adrenals unremarkable. Nonobstructing calculi lower pole right kidney unchanged, measuring less than 3 mm. No left-sided calculi. No  obstructive uropathy within either kidney. Bladder is unremarkable. Stomach/Bowel: There is diffuse colonic wall thickening extending from the cecum through the rectum, consistent with patient's history of ulcerative colitis. The appendix appears unremarkable. No bowel obstruction or ileus. Vascular/Lymphatic: No significant vascular findings are present. No enlarged abdominal or pelvic lymph nodes. Reproductive: Uterus and bilateral adnexa are unremarkable. Other: No free fluid or free gas.  No abdominal wall hernia. Musculoskeletal: No acute or destructive bony lesions. Stable spondylosis at the lumbosacral junction. Subcutaneous gas within the anterior proximal left thigh may reflect recent subcutaneous injection. Reconstructed images demonstrate no additional findings. IMPRESSION: 1. Diffuse colonic wall thickening consistent with ulcerative colitis. 2. Borderline splenomegaly. 3. 4 mm left upper lobe pulmonary nodule. No follow-up needed if patient is low-risk. Non-contrast chest CT can be considered in 12 months if patient is high-risk. This recommendation follows the consensus statement: Guidelines for Management of Incidental Pulmonary Nodules Detected on CT Images: From the Fleischner Society 2017; Radiology 2017; 284:228-243. 4. Nonobstructing less than 3 mm right renal calculi. 5. Stable right adrenal adenoma. Electronically Signed   By: Randa Ngo M.D.   On: 01/13/2022 16:56    Microbiology: Recent Results (from the past 240 hour(s))  Resp Panel by RT-PCR (Flu A&B, Covid) Nasopharyngeal Swab     Status: None   Collection Time: 01/12/22  8:34 PM   Specimen: Nasopharyngeal Swab; Nasopharyngeal(NP) swabs in vial transport medium  Result Value Ref Range Status   SARS Coronavirus 2 by RT PCR NEGATIVE NEGATIVE Final    Comment: (NOTE) SARS-CoV-2 target nucleic acids are NOT DETECTED.  The SARS-CoV-2 RNA is generally detectable in upper respiratory specimens during the acute phase of infection.  The lowest concentration of SARS-CoV-2 viral copies this assay can detect is 138 copies/mL. A negative result does not preclude SARS-Cov-2 infection and should not be used as the sole basis for treatment or other patient management decisions. A negative result may occur with  improper specimen collection/handling, submission of specimen other than nasopharyngeal swab, presence of viral mutation(s) within the areas targeted by this assay, and inadequate number of viral copies(<138 copies/mL). A negative result must be combined with clinical observations, patient history, and epidemiological information. The expected result is Negative.  Fact Sheet for Patients:  EntrepreneurPulse.com.au  Fact Sheet for Healthcare Providers:  IncredibleEmployment.be  This test is no t yet approved or cleared by the [place] FDA and  has been authorized for detection and/or diagnosis of SARS-CoV-2 by FDA under an Emergency Use Authorization (EUA). This EUA will remain  in effect (meaning this test can be used) for the duration of the COVID-19 declaration under Section 564(b)(1) of the Act, 21 U.S.C.section 360bbb-3(b)(1), unless the authorization  is terminated  or revoked sooner.       Influenza A by PCR NEGATIVE NEGATIVE Final   Influenza B by PCR NEGATIVE NEGATIVE Final    Comment: (NOTE) The Xpert Xpress SARS-CoV-2/FLU/RSV plus assay is intended as an aid in the diagnosis of influenza from Nasopharyngeal swab specimens and should not be used as a sole basis for treatment. Nasal washings and aspirates are unacceptable for Xpert Xpress SARS-CoV-2/FLU/RSV testing.  Fact Sheet for Patients: EntrepreneurPulse.com.au  Fact Sheet for Healthcare Providers: IncredibleEmployment.be  This test is not yet approved or cleared by the [place] FDA and has been authorized for detection and/or diagnosis of SARS-CoV-2 by FDA  under an Emergency Use Authorization (EUA). This EUA will remain in effect (meaning this test can be used) for the duration of the COVID-19 declaration under Section 564(b)(1) of the Act, 21 U.S.C. section 360bbb-3(b)(1), unless the authorization is terminated or revoked.  Performed at [facility], [place] [address], [place], [place] [address]   C Difficile Quick Screen w PCR reflex     Status: None   Collection Time: 01/13/22  2:33 PM   Specimen: STOOL  Result Value Ref Range Status   C Diff antigen NEGATIVE NEGATIVE Final   C Diff toxin NEGATIVE NEGATIVE Final   C Diff interpretation No C. difficile detected.  Final    Comment: Performed at [facility], [place] [address] [place], [place] [address]  Virus culture     Status: None   Collection Time: 01/14/22 10:10 AM   Specimen: PATH GI biopsy; Tissue  Result Value Ref Range Status   Viral Culture Comment  Final    Comment: (NOTE) Preliminary Report: No virus isolated at 4 days.  Next report to follow after 7 days. Performed At: [facility] [place], [place] [id number] Rush Farmer MD MH:[id number]   Calprotectin, Fecal     Status: Abnormal   Collection Time: 01/17/22 12:10 PM   Specimen: Stool  Result Value Ref Range Status   Calprotectin, Fecal 1,548 (H) 0 - 120 ug/g Final    Comment: (NOTE) Concentration     Interpretation   Follow-Up <16 - 50 ug/g     Normal           None >50 -120 ug/g     Borderline       Re-evaluate in 4-6 weeks    >120 ug/g     Abnormal         Repeat as clinically                                   indicated Performed At: [facility] [place], [place] [id number] Rush Farmer MD CX:[id number]      Labs: Basic Metabolic Panel: Recent Labs  Lab 01/14/22 0550 01/15/22 0559 01/18/22 0938 01/19/22 0545  NA 135 137 135 135  K 3.9 4.0 3.5 3.7  CL 107 107 102 107  CO2 23 23 24 24   GLUCOSE 111* 95 91 82  BUN 9  11 14 20   CREATININE 0.50 0.66 0.62 0.77  CALCIUM 7.7* 8.5* 8.8* 8.3*  MG 2.4  --   --  1.8   Liver Function Tests: Recent Labs  Lab 01/18/22 0938  AST 10*  ALT 12  ALKPHOS 43  BILITOT 0.4  PROT 6.5  ALBUMIN 2.7*   No results for input(s): LIPASE, AMYLASE in the  last 168 hours. No results for input(s): AMMONIA in the last 168 hours. CBC: Recent Labs  Lab 01/14/22 0550 01/15/22 0559 01/16/22 0523 01/17/22 0501 01/19/22 0545 01/20/22 0516  WBC 14.7* 11.9* 13.0* 13.8* 15.1* 13.2*  NEUTROABS 9.0*  --   --  7.5 8.0* 6.8  HGB 11.0* 10.8* 11.9* 11.6* 10.9* 10.4*  HCT 35.0* 35.2* 37.8 38.4 36.0 33.7*  MCV 83.1 84.4 84.0 85.7 84.7 83.8  PLT 366 371 391 448* 365 350   Cardiac Enzymes: No results for input(s): CKTOTAL, CKMB, CKMBINDEX, TROPONINI in the last 168 hours. BNP: BNP (last 3 results) No results for input(s): BNP in the last 8760 hours.  ProBNP (last 3 results) No results for input(s): PROBNP in the last 8760 hours.  CBG: No results for input(s): GLUCAP in the last 168 hours.  FURTHER DISCHARGE INSTRUCTIONS:   Get Medicines reviewed and adjusted: Please take all your medications with you for your next visit with your Primary MD   Laboratory/radiological data: Please request your Primary MD to go over all hospital tests and procedure/radiological results at the follow up, please ask your Primary MD to get all Hospital records sent to his/her office.   In some cases, they will be blood work, cultures and biopsy results pending at the time of your discharge. Please request that your primary care M.D. goes through all the records of your hospital data and follows up on these results.   Also Note the following: If you experience worsening of your admission symptoms, develop shortness of breath, life threatening emergency, suicidal or homicidal thoughts you must seek medical attention immediately by calling 911 or calling your MD immediately  if symptoms less  severe.   You must read complete instructions/literature along with all the possible adverse reactions/side effects for all the Medicines you take and that have been prescribed to you. Take any new Medicines after you have completely understood and accpet all the possible adverse reactions/side effects.    Do not drive when taking Pain medications or sleeping medications (Benzodaizepines)   Do not take more than prescribed Pain, Sleep and Anxiety Medications. It is not advisable to combine anxiety,sleep and pain medications without talking with your primary care practitioner   Special Instructions: If you have smoked or chewed Tobacco  in the last 2 yrs please stop smoking, stop any regular Alcohol  and or any Recreational drug use.   Wear Seat belts while driving.   Please note: You were cared for by a hospitalist during your hospital stay. Once you are discharged, your primary care physician will handle any further medical issues. Please note that NO REFILLS for any discharge medications will be authorized once you are discharged, as it is imperative that you return to your primary care physician (or establish a relationship with a primary care physician if you do not have one) for your post hospital discharge needs so that they can reassess your need for medications and monitor your lab values.     Signed:  Florencia Reasons MD, PhD, FACP  [facility] 01/20/2022, 11:28 AM

## 2022-01-20 NOTE — Telephone Encounter (Signed)
Spoke with the patient. Confirmed for medications per Dr Silverio Decamp. Patient will continue Humira. Next injection is due on Thursday 01/26/22. We will start the PA for weekly injections. Use the Canasa suppositories as needed at bedtime. Refilled this at her pharmacy. Offered refill on prednisone. She said she has enough right now to continue 40 mg daily.

## 2022-01-20 NOTE — Progress Notes (Signed)
° °   Progress Note   Subjective  Chief Complaint: Anemia and diarrhea in the setting of ulcerative colitis  Today, the patient tells me she only had a total of 3-4 loose stools all day yesterday and the 2 she had in the evening had no blood in the stool in general has more formed.  She is comfortably eating breakfast and doing well.  She is happy to go home.   Objective   Vital signs in last 24 hours: Temp:  [98 F (36.7 C)-98.5 F (36.9 C)] 98.4 F (36.9 C) (02/03 0540) Pulse Rate:  [76-124] 76 (02/03 0540) Resp:  [17-18] 18 (02/03 0540) BP: (115-121)/(70-78) 117/70 (02/03 0540) SpO2:  [97 %-99 %] 99 % (02/03 0540) Last BM Date: 01/20/22 General:    white female in NAD Heart:  Regular rate and rhythm; no murmurs Lungs: Respirations even and unlabored, lungs CTA bilaterally Abdomen:  Soft, nontender and nondistended. Normal bowel sounds. Psych:  Cooperative. Normal mood and affect.  Intake/Output from previous day: 02/02 0701 - 02/03 0700 In: 906.4 [P.O.:720; IV Piggyback:186.4] Out: -    Lab Results: Recent Labs    01/19/22 0545 01/20/22 0516  WBC 15.1* 13.2*  HGB 10.9* 10.4*  HCT 36.0 33.7*  PLT 365 350   BMET Recent Labs    01/18/22 0938 01/19/22 0545  NA 135 135  K 3.5 3.7  CL 102 107  CO2 24 24  GLUCOSE 91 82  BUN 14 20  CREATININE 0.62 0.77  CALCIUM 8.8* 8.3*   LFT Recent Labs    01/18/22 0938  PROT 6.5  ALBUMIN 2.7*  AST 10*  ALT 12  ALKPHOS 43  BILITOT 0.4    Assessment / Plan:   Assessment: 1.  Severe extensive UC: With anemia, hemoglobin stable at 10.4 today (patient received a total of 4 units PRBCs this hospitalization), iron and ferritin low, has been given Feraheme yesterday and started on oral iron  Plan: 1.  Discussed with patient that she will stay on Prednisone 40 mg daily until we can figure out what to do with her Humira once the antibody levels come back. 2.  Patient will continue Mesalamine suppositories nightly.  She tells  me she has these at home already. 3.  Patient has been scheduled for a follow-up visit with Dr. Silverio Decamp via telehealth on 2/23.  She is aware and this will be on her discharge paperwork. 4.  Patient is told to come in next week for repeat labs including CBC, CMP, ESR and CRP which have already been ordered.  Patient is ready for discharge.  Thank for the kind consultation.  We will sign off.   LOS: 5 days   Levin Erp  01/20/2022, 9:45 AM

## 2022-01-23 LAB — VIRUS CULTURE

## 2022-01-24 ENCOUNTER — Other Ambulatory Visit (INDEPENDENT_AMBULATORY_CARE_PROVIDER_SITE_OTHER): Payer: BC Managed Care – PPO

## 2022-01-24 ENCOUNTER — Telehealth: Payer: Self-pay | Admitting: Gastroenterology

## 2022-01-24 DIAGNOSIS — D649 Anemia, unspecified: Secondary | ICD-10-CM | POA: Diagnosis not present

## 2022-01-24 DIAGNOSIS — K5100 Ulcerative (chronic) pancolitis without complications: Secondary | ICD-10-CM | POA: Diagnosis not present

## 2022-01-24 LAB — COMPREHENSIVE METABOLIC PANEL
ALT: 10 U/L (ref 0–35)
AST: 8 U/L (ref 0–37)
Albumin: 3.2 g/dL — ABNORMAL LOW (ref 3.5–5.2)
Alkaline Phosphatase: 49 U/L (ref 39–117)
BUN: 25 mg/dL — ABNORMAL HIGH (ref 6–23)
CO2: 30 mEq/L (ref 19–32)
Calcium: 8.7 mg/dL (ref 8.4–10.5)
Chloride: 100 mEq/L (ref 96–112)
Creatinine, Ser: 0.69 mg/dL (ref 0.40–1.20)
GFR: 115.06 mL/min (ref >60.00)
Glucose, Bld: 145 mg/dL — ABNORMAL HIGH (ref 70–99)
Potassium: 4 mEq/L (ref 3.5–5.1)
Sodium: 134 mEq/L — ABNORMAL LOW (ref 135–145)
Total Bilirubin: 0.4 mg/dL (ref 0.2–1.2)
Total Protein: 6.3 g/dL (ref 6.0–8.3)

## 2022-01-24 LAB — CBC
HCT: 32.8 % — ABNORMAL LOW (ref 36.0–46.0)
Hemoglobin: 10.2 g/dL — ABNORMAL LOW (ref 12.0–15.0)
MCHC: 31.2 g/dL (ref 30.0–36.0)
MCV: 81.4 fl (ref 78.0–100.0)
Platelets: 505 10*3/uL — ABNORMAL HIGH (ref 150.0–400.0)
RBC: 4.03 Mil/uL (ref 3.87–5.11)
RDW: 24.1 % — ABNORMAL HIGH (ref 11.5–15.5)
WBC: 13.6 10*3/uL — ABNORMAL HIGH (ref 4.0–10.5)

## 2022-01-24 LAB — SEDIMENTATION RATE: Sed Rate: 40 mm/hr — ABNORMAL HIGH (ref 0–20)

## 2022-01-24 LAB — ADALIMUMAB LEVEL AND ANTIBODY
Adalimumab Drug Level: 2.7 ug/mL
Anti-Adalimumab Antibody: <25 ng/mL

## 2022-01-24 LAB — HIGH SENSITIVITY CRP: CRP, High Sensitivity: 26.39 mg/L — ABNORMAL HIGH (ref 0.000–5.000)

## 2022-01-24 NOTE — Telephone Encounter (Signed)
Patient called would like to discuss lab results.

## 2022-01-25 NOTE — Telephone Encounter (Signed)
Spoke with the patient about her lab results and the plan to increase the Humira to weekly dosing. She is due Humira 01/26/22 on her bi-weekly dosing. She has 2 pens.  PA submitted to [facility], the pharmacy Transport planner for [facility]. Denied weekly dosing based on patient not being on Humira prior to age 31 years. Records submitted to the "Medical Director" of [facility] asking this be re-considered based on her clinical evidence. Office visit of 10/17/21 to present, including hospitalization, labs and procedures faxed to [facility] at [phone] for the PA case [id number]. Each page includes 3 identifiers of the patient (DOB, full name and the case number.)

## 2022-01-25 NOTE — Telephone Encounter (Signed)
Patient called back asking when she would be due for labs.

## 2022-01-26 ENCOUNTER — Ambulatory Visit: Payer: BC Managed Care – PPO | Admitting: Gastroenterology

## 2022-01-27 ENCOUNTER — Other Ambulatory Visit: Payer: Self-pay

## 2022-01-27 MED ORDER — HUMIRA (2 PEN) 40 MG/0.4ML ~~LOC~~ AJKT: 40.0000 mg | AUTO-INJECTOR | SUBCUTANEOUS | 6 refills | Status: DC

## 2022-01-27 MED ORDER — LORAZEPAM 0.5 MG PO TABS: 0.5000 mg | ORAL_TABLET | Freq: Every evening | ORAL | 0 refills | Status: DC | PRN

## 2022-01-27 NOTE — Telephone Encounter (Signed)
Okay to send Rx for 30 tablets to use as needed.  Please advise patient to avoid taking lorazepam along with Benadryl.  Thanks

## 2022-01-27 NOTE — Telephone Encounter (Signed)
Called the patient x 2. No answer. Left a message advising of the Ativan [facility] [address]. The Humira has been approved for weekly use and a new Rx has been sent to [place].

## 2022-01-27 NOTE — Telephone Encounter (Signed)
Inbound call from patient, seeking advice if there is anyway she can get sleeping medication due to taking prednisone and its keeping her up at night. Please advise.

## 2022-01-27 NOTE — Telephone Encounter (Signed)
When she was an inpatient, it looks like they gave her 0.5 lorazepam for sleep.  Could we do that short term? She is taking benadryl 50 mg without relief.

## 2022-02-06 ENCOUNTER — Ambulatory Visit: Payer: BC Managed Care – PPO | Admitting: Nurse Practitioner

## 2022-02-06 ENCOUNTER — Telehealth: Payer: Self-pay

## 2022-02-06 ENCOUNTER — Other Ambulatory Visit: Payer: BC Managed Care – PPO

## 2022-02-06 NOTE — Telephone Encounter (Signed)
Received a call from the lab indicating that pt has presented for lab draw but orders had not been placed. Review of pt chart did not reflect any correspondence about repeat labs. In addition, those involved in pt care: Dr. Silverio Decamp, Ellouise Newer, PA-C, Virgina Evener, RN and Delila Spence, RN, are not in the office to request specific orders needing drawn. Further discussion had with Practice Admin, Barb Merino, RN to determine if I may have been overlooking orders/communication. Agreed she was unable to locate this information either. Pt and phlebotomist informed that lab draw cannot take place today and will need to wait until care team has returned tomorrow. Pt states she will not be able to get her labs drawn until 02/08/22 and her virtual visit is 02/09/22. Uncertain if this delay may interfere with her appt. Advised I will be including her care team for them to address upon their return. Verbalized acceptance and understanding.

## 2022-02-07 ENCOUNTER — Other Ambulatory Visit: Payer: Self-pay

## 2022-02-07 DIAGNOSIS — K625 Hemorrhage of anus and rectum: Secondary | ICD-10-CM

## 2022-02-07 DIAGNOSIS — K5100 Ulcerative (chronic) pancolitis without complications: Secondary | ICD-10-CM

## 2022-02-07 NOTE — Telephone Encounter (Signed)
Gloria Green, can you please order CBC, CMP, iron panel and CRP.  She has a virtual visit scheduled for 2/23.  Thank you

## 2022-02-08 ENCOUNTER — Other Ambulatory Visit (INDEPENDENT_AMBULATORY_CARE_PROVIDER_SITE_OTHER): Payer: BC Managed Care – PPO

## 2022-02-08 ENCOUNTER — Other Ambulatory Visit: Payer: BC Managed Care – PPO

## 2022-02-08 DIAGNOSIS — R197 Diarrhea, unspecified: Secondary | ICD-10-CM

## 2022-02-08 DIAGNOSIS — K529 Noninfective gastroenteritis and colitis, unspecified: Secondary | ICD-10-CM

## 2022-02-08 DIAGNOSIS — K5100 Ulcerative (chronic) pancolitis without complications: Secondary | ICD-10-CM | POA: Diagnosis not present

## 2022-02-08 DIAGNOSIS — K625 Hemorrhage of anus and rectum: Secondary | ICD-10-CM | POA: Diagnosis not present

## 2022-02-08 DIAGNOSIS — R509 Fever, unspecified: Secondary | ICD-10-CM

## 2022-02-08 LAB — COMPREHENSIVE METABOLIC PANEL
ALT: 12 U/L (ref 0–35)
AST: 10 U/L (ref 0–37)
Albumin: 3.8 g/dL (ref 3.5–5.2)
Alkaline Phosphatase: 34 U/L — ABNORMAL LOW (ref 39–117)
BUN: 26 mg/dL — ABNORMAL HIGH (ref 6–23)
CO2: 31 mEq/L (ref 19–32)
Calcium: 9 mg/dL (ref 8.4–10.5)
Chloride: 103 mEq/L (ref 96–112)
Creatinine, Ser: 0.69 mg/dL (ref 0.40–1.20)
GFR: 115.03 mL/min (ref >60.00)
Glucose, Bld: 113 mg/dL — ABNORMAL HIGH (ref 70–99)
Potassium: 4.6 mEq/L (ref 3.5–5.1)
Sodium: 136 mEq/L (ref 135–145)
Total Bilirubin: 0.4 mg/dL (ref 0.2–1.2)
Total Protein: 6.7 g/dL (ref 6.0–8.3)

## 2022-02-08 LAB — CBC WITH DIFFERENTIAL/PLATELET
Basophils Absolute: 0 10*3/uL (ref 0.0–0.1)
Basophils Relative: 0.4 % (ref 0.0–3.0)
Eosinophils Absolute: 0 10*3/uL (ref 0.0–0.7)
Eosinophils Relative: 0 % (ref 0.0–5.0)
HCT: 33.8 % — ABNORMAL LOW (ref 36.0–46.0)
Hemoglobin: 10.6 g/dL — ABNORMAL LOW (ref 12.0–15.0)
Lymphocytes Relative: 15.2 % (ref 12.0–46.0)
Lymphs Abs: 1.5 10*3/uL (ref 0.7–4.0)
MCHC: 31.5 g/dL (ref 30.0–36.0)
MCV: 84.9 fl (ref 78.0–100.0)
Monocytes Absolute: 0.2 10*3/uL (ref 0.1–1.0)
Monocytes Relative: 2 % — ABNORMAL LOW (ref 3.0–12.0)
Neutro Abs: 8.2 10*3/uL — ABNORMAL HIGH (ref 1.4–7.7)
Neutrophils Relative %: 82.4 % — ABNORMAL HIGH (ref 43.0–77.0)
Platelets: 358 10*3/uL (ref 150.0–400.0)
RBC: 3.98 Mil/uL (ref 3.87–5.11)
RDW: 24.9 % — ABNORMAL HIGH (ref 11.5–15.5)
WBC: 10 10*3/uL (ref 4.0–10.5)

## 2022-02-08 LAB — HIGH SENSITIVITY CRP: CRP, High Sensitivity: 3.73 mg/L (ref 0.000–5.000)

## 2022-02-09 ENCOUNTER — Telehealth (INDEPENDENT_AMBULATORY_CARE_PROVIDER_SITE_OTHER): Payer: BC Managed Care – PPO | Admitting: Gastroenterology

## 2022-02-09 ENCOUNTER — Encounter: Payer: Self-pay | Admitting: Gastroenterology

## 2022-02-09 VITALS — Ht 61.0 in | Wt 120.0 lb

## 2022-02-09 DIAGNOSIS — D508 Other iron deficiency anemias: Secondary | ICD-10-CM

## 2022-02-09 DIAGNOSIS — K51011 Ulcerative (chronic) pancolitis with rectal bleeding: Secondary | ICD-10-CM | POA: Diagnosis not present

## 2022-02-09 LAB — IRON,TIBC AND FERRITIN PANEL
%SAT: 10 % (calc) — ABNORMAL LOW (ref 16–45)
Ferritin: 51 ng/mL (ref 16–154)
Iron: 31 ug/dL — ABNORMAL LOW (ref 40–190)
TIBC: 310 mcg/dL (calc) (ref 250–450)

## 2022-02-09 MED ORDER — PREDNISONE 5 MG PO TABS: ORAL_TABLET | ORAL | 0 refills | Status: DC

## 2022-02-09 NOTE — Progress Notes (Signed)
Gloria Green    [id number]    [birth year]  Primary Care Physician:Mitchell, L.Marlou Sa, MD  Referring Physician: Alroy Dust, Carlean Jews.Marlou Sa, [place] [employer] [place],  [place] [address]  This service was provided via  telemedicine due to [place] 19 pandemic.  I connected with Gloria Green on 02/09/22 at 11:00 AM EST by a video enabled telemedicine application and verified that I am speaking with the correct person using two identifiers.  Patient location: Work Provider location: Presenter, broadcasting   I discussed the limitations, risks, security and privacy concerns of performing an evaluation and management service by video enabled telemedicine application and the availability of in person appointments. I also discussed with the patient that there may be a patient responsible charge related to this service. The patient expressed understanding and agreed to proceed.   The persons participating in this telemedicine service were myself and the patient  Interactive audio and video telecommunications were attempted between this provider and patient, however interrupted due to patient having technical difficulties OR patient did not have access to video capability. We continued and completed visit with audio only.    Chief complaint:  UC  HPI:  31 year old very pleasant female here for follow-up visit after recent hospitalization with acute UC flare She is continuing to have multiple bowel movements per day, on average 6-8 with intermittent rectal bleeding though improved to the frequency she was having during hospitalization.  She is on prednisone 40 mg daily  She is on Humira  CT chest abdomen and pelvis with contrast January 13, 2022 1. Diffuse colonic wall thickening consistent with ulcerative colitis. 2. Borderline splenomegaly. 3. 4 mm left upper lobe pulmonary nodule. No follow-up needed if patient is low-risk. Non-contrast chest CT can be considered in  12 months if patient is high-risk. This recommendation follows the consensus statement: Guidelines for Management of Incidental Pulmonary Nodules Detected on CT Images: From the Fleischner Society 2017; Radiology 2017; 284:228-243. 4. Nonobstructing less than 3 mm right renal calculi. 5. Stable right adrenal adenoma.    CT abd & pelvis 10/16/2021 1. Colitis involving the transverse, descending and sigmoid colon and rectum. No evidence of bowel obstruction, focal collection or pneumoperitoneum. 2. 3 mm nonobstructing RIGHT LOWER pole renal calculus. 3. 1.5 x 2 cm RIGHT adrenal adenoma.   She is taking alternating Tylenol and ibuprofen with persistent high fever if she does not take Tylenol.  She is having lower abdominal pain and cramping, has generalized abdominal discomfort. Denies abnormal vaginal bleeding or discharge She is currently not breast-feeding   She has no appetite and is barely eating anything.     Past relevant Hx: Fecal calprotectin mildly elevated at 121 in Jan 2022   CRP mildly elevated 15 (Normal range 0-10)   CRP was elevated to 73 in May 2022   Outpatient Encounter Medications as of 02/09/2022  Medication Sig   Adalimumab (HUMIRA PEN) 40 MG/0.4ML PNKT Inject 40 mg into the skin once a week.   citalopram (CELEXA) 20 MG tablet Take 20 mg by mouth daily.   ferrous sulfate 325 (65 FE) MG tablet Take 1 tablet (325 mg total) by mouth every other day.   LORazepam (ATIVAN) 0.5 MG tablet Take 1 tablet (0.5 mg total) by mouth at bedtime as needed for sleep.   mesalamine (CANASA) 1000 MG suppository Place 1 suppository (1,000 mg total) rectally at bedtime.   Multiple Vitamin (MULTIVITAMIN) tablet Take 4  tablets by mouth daily.   ondansetron (ZOFRAN) 4 MG tablet Take 1 tablet (4 mg total) by mouth daily as needed for nausea or vomiting.   predniSONE (DELTASONE) 20 MG tablet Take 2 tablets (40 mg total) by mouth daily with breakfast. 40 mg daily   predniSONE  (DELTASONE) 5 MG tablet Please use in your prednisone taper as instructed.   promethazine (PHENERGAN) 12.5 MG tablet Take 1 tablet (12.5 mg total) by mouth daily as needed for nausea or vomiting.   diphenoxylate-atropine (LOMOTIL) 2.5-0.025 MG tablet Take 1 tablet by mouth daily as needed for diarrhea or loose stools. (Patient not taking: Reported on 02/09/2022)   loperamide (IMODIUM) 2 MG capsule Take 2 mg by mouth daily as needed for diarrhea or loose stools. (Patient not taking: Reported on 02/09/2022)   [DISCONTINUED] dicyclomine (BENTYL) 20 MG tablet Take 1 tablet (20 mg total) by mouth 2 (two) times daily as needed for up to 5 days for spasms. (Patient not taking: Reported on 01/12/2022)   No facility-administered encounter medications on file as of 02/09/2022.    Allergies as of 02/09/2022 - Review Complete 02/09/2022  Allergen Reaction Noted   Sulfa antibiotics Hives 04/02/2013   Latex Rash 06/09/2019   Wound dressing adhesive Rash 10/16/2021    Past Medical History:  Diagnosis Date   Anal fissure    Anemia    Anxiety    Hemorrhoid    Ulcerative colitis [facility])     Past Surgical History:  Procedure Laterality Date   BIOPSY  01/14/2022   Procedure: BIOPSY;  Surgeon: Milus Banister, MD;  Location: [facility];  Service: Endoscopy;;   CESAREAN SECTION  09/11/2021   COSMETIC SURGERY  12/18/2010   nose   ESOPHAGOGASTRODUODENOSCOPY (EGD) WITH PROPOFOL N/A 01/14/2022   Procedure: ESOPHAGOGASTRODUODENOSCOPY (EGD) WITH PROPOFOL;  Surgeon: Milus Banister, MD;  Location: [facility];  Service: Endoscopy;  Laterality: N/A;   FLEXIBLE SIGMOIDOSCOPY N/A 01/14/2022   Procedure: FLEXIBLE SIGMOIDOSCOPY;  Surgeon: Milus Banister, MD;  Location: [facility];  Service: Endoscopy;  Laterality: N/A;   MYOMECTOMY N/A 06/16/2019   Procedure: ABDOMINAL MYOMECTOMY;  Surgeon: Molli Posey, MD;  Location: [facility];  Service: Gynecology;  Laterality: N/A;   WISDOM TOOTH  EXTRACTION      Family History  Problem Relation Age of Onset   Hypertension Mother    Obesity Father    Hypertension Brother    Lung cancer Maternal Grandmother    Lung cancer Maternal Grandfather     Social History   Socioeconomic History   Marital status: Married    Spouse name: Not on file   Number of children: 2   Years of education: Not on file   Highest education level: Not on file  Occupational History   Occupation: Teacher  Tobacco Use   Smoking status: Former    Types: Cigarettes    Quit date: 06/11/2017    Years since quitting: 4.6   Smokeless tobacco: Never  Vaping Use   Vaping Use: Former   Quit date: 10/11/2018  Substance and Sexual Activity   Alcohol use: Not Currently    Comment: occ   Drug use: Never   Sexual activity: Not on file  Other Topics Concern   Not on file  Social History Narrative   Not on file   Social Determinants of Health   Financial Resource Strain: Not on file  Food Insecurity: Not on file  Transportation Needs: Not on file  Physical Activity: Not on  file  Stress: Not on file  Social Connections: Not on file  Intimate Partner Violence: Not on file      Review of systems: Review of Systems as per HPI All other systems reviewed and are negative.   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 The patient was provided an opportunity to ask questions and all were answered. The patient agreed with the plan and demonstrated an understanding of the instructions.   The patient was advised to call back or seek an in-person evaluation if the symptoms worsen or if the condition fails to improve as anticipated.  I provided 30 minutes of non-face-to-face time during this encounter.   Harl Bowie, MD   CC: Alroy Dust, L.Marlou Sa, MD

## 2022-02-09 NOTE — Patient Instructions (Signed)
If you are age 33 or older, your body mass index should be between 23-30. Your Body mass index is 22.67 kg/m. If this is out of the aforementioned range listed, please consider follow up with your Primary Care Provider.  If you are age 72 or younger, your body mass index should be between 19-25. Your Body mass index is 22.67 kg/m. If this is out of the aformentioned range listed, please consider follow up with your Primary Care Provider.   ________________________________________________________  The [facility] providers would like to encourage you to use [facility] to communicate with providers for non-urgent requests or questions.  Due to long hold times on the telephone, sending your provider a message by [facility] may be a faster and more efficient way to get a response.  Please allow 48 business hours for a response.  Please remember that this is for non-urgent requests.  _______________________________________________________  Due to recent changes in healthcare laws, you may see the results of your imaging and laboratory studies on MyChart before your provider has had a chance to review them.  We understand that in some cases there may be results that are confusing or concerning to you. Not all laboratory results come back in the same time frame and the provider may be waiting for multiple results in order to interpret others.  Please give [place] 48 hours in order for your provider to thoroughly review all the results before contacting the office for clarification of your results.   Your provider has requested that you go to the basement level for lab work in 2 weeks (please complete 2 days before the office visit on 02-21-2022. Press "B" on the elevator. The lab is located at the first door on the left as you exit the elevator.  Prednisone taper.  The taper instructions are as follows:  Prednisone 30 mg daily x 7 days  Prednisone 20 mg daily x 7 days  Prednisone 10 mg daily x 7 days  Prednisone  5 mg daily x 14 days Then, discontinue.  We have sent the 71m prednisone tablets to the pharmacy.   It was a pleasure to see you today!  Thank you for trusting me with your gastrointestinal care!

## 2022-02-12 ENCOUNTER — Other Ambulatory Visit: Payer: Self-pay | Admitting: Gastroenterology

## 2022-02-12 DIAGNOSIS — K625 Hemorrhage of anus and rectum: Secondary | ICD-10-CM

## 2022-02-12 DIAGNOSIS — K649 Unspecified hemorrhoids: Secondary | ICD-10-CM

## 2022-02-17 ENCOUNTER — Other Ambulatory Visit (INDEPENDENT_AMBULATORY_CARE_PROVIDER_SITE_OTHER): Payer: BC Managed Care – PPO

## 2022-02-17 DIAGNOSIS — K51011 Ulcerative (chronic) pancolitis with rectal bleeding: Secondary | ICD-10-CM | POA: Diagnosis not present

## 2022-02-17 DIAGNOSIS — D508 Other iron deficiency anemias: Secondary | ICD-10-CM | POA: Diagnosis not present

## 2022-02-17 LAB — CBC WITH DIFFERENTIAL/PLATELET
Basophils Absolute: 0 10*3/uL (ref 0.0–0.1)
Basophils Relative: 0.2 % (ref 0.0–3.0)
Eosinophils Absolute: 0 10*3/uL (ref 0.0–0.7)
Eosinophils Relative: 0.2 % (ref 0.0–5.0)
HCT: 36 % (ref 36.0–46.0)
Hemoglobin: 11.6 g/dL — ABNORMAL LOW (ref 12.0–15.0)
Lymphocytes Relative: 27.5 % (ref 12.0–46.0)
Lymphs Abs: 2.1 10*3/uL (ref 0.7–4.0)
MCHC: 32.2 g/dL (ref 30.0–36.0)
MCV: 87 fl (ref 78.0–100.0)
Monocytes Absolute: 0.5 10*3/uL (ref 0.1–1.0)
Monocytes Relative: 6.6 % (ref 3.0–12.0)
Neutro Abs: 5.1 10*3/uL (ref 1.4–7.7)
Neutrophils Relative %: 65.5 % (ref 43.0–77.0)
Platelets: 293 10*3/uL (ref 150.0–400.0)
RBC: 4.13 Mil/uL (ref 3.87–5.11)
RDW: 24.5 % — ABNORMAL HIGH (ref 11.5–15.5)
WBC: 7.8 10*3/uL (ref 4.0–10.5)

## 2022-02-17 LAB — C-REACTIVE PROTEIN: CRP: <1 mg/dL (ref 0.5–20.0)

## 2022-02-17 LAB — SEDIMENTATION RATE: Sed Rate: 14 mm/hr (ref 0–20)

## 2022-02-21 ENCOUNTER — Ambulatory Visit (INDEPENDENT_AMBULATORY_CARE_PROVIDER_SITE_OTHER): Payer: BC Managed Care – PPO | Admitting: Gastroenterology

## 2022-02-21 ENCOUNTER — Encounter: Payer: Self-pay | Admitting: Gastroenterology

## 2022-02-21 VITALS — BP 112/64 | HR 70 | Ht 62.0 in | Wt 122.0 lb

## 2022-02-21 DIAGNOSIS — K5100 Ulcerative (chronic) pancolitis without complications: Secondary | ICD-10-CM | POA: Diagnosis not present

## 2022-02-21 DIAGNOSIS — D508 Other iron deficiency anemias: Secondary | ICD-10-CM

## 2022-02-21 NOTE — Progress Notes (Signed)
? ?       ? ?Gloria Green    [id number]    25-Jan-1991 ? ?Primary Care Physician:Mitchell, L.Marlou Sa, MD ? ?Referring Physician: Alroy Dust, L.Marlou Sa, MD ?[address] ?[address] ?[place],  [place] [address] ? ? ?Chief complaint:  Ulcerative pan colitis ? ?HPI: ? ?31 year old very pleasant female here for follow-up visit after recent hospitalization February 2023 with acute UC flare ?She is doing much better on weekly Humira, has been on it for 4+ weeks so far ?She is tapering down prednisone and is currently at 20 mg daily ?  ?On average she is having 2-3 formed bowel movements with no blood in stool.  No abdominal pain, vomiting or weight loss ? ?Flexible sigmoidoscopy January 14, 2022 ?- Severe inflammation from anus to the extent of the exam (mid sigmoid colon), ?characterized by ulceration, pseudopolyps, very friable, congested mucosa. This was ?biopsied for histology and also for viral culture. ?- Small internal hemorrhoids. ? ?EGD 01/14/22 ?- No fresh or old blood in the UGI tract. ?- Retained solid food in the stomach without anatomic outlet obstruction, suggesting a ?component of slow gastric emptying. ?  ?CT chest abdomen and pelvis with contrast January 13, 2022 ?1. Diffuse colonic wall thickening consistent with ulcerative ?colitis. ?2. Borderline splenomegaly. ?3. 4 mm left upper lobe pulmonary nodule. No follow-up needed if ?patient is low-risk. Non-contrast chest CT can be considered in 12 ?months if patient is high-risk. This recommendation follows the ?consensus statement: Guidelines for Management of Incidental ?Pulmonary Nodules Detected on CT Images: From the Fleischner Society ?2017; Radiology 2017; 742:595-638. ?4. Nonobstructing less than 3 mm right renal calculi. ?5. Stable right adrenal adenoma. ?  ? ? ? ?Outpatient Encounter Medications as of 02/21/2022  ?Medication Sig  ? Adalimumab (HUMIRA PEN) 40 MG/0.4ML PNKT Inject 40 mg into the skin once a week.  ? citalopram (CELEXA) 20 MG tablet  Take 20 mg by mouth daily.  ? diphenoxylate-atropine (LOMOTIL) 2.5-0.025 MG tablet Take 1 tablet by mouth daily as needed for diarrhea or loose stools. (Patient not taking: Reported on 02/09/2022)  ? ferrous sulfate 325 (65 FE) MG tablet Take 1 tablet (325 mg total) by mouth every other day.  ? loperamide (IMODIUM) 2 MG capsule Take 2 mg by mouth daily as needed for diarrhea or loose stools. (Patient not taking: Reported on 02/09/2022)  ? LORazepam (ATIVAN) 0.5 MG tablet Take 1 tablet (0.5 mg total) by mouth at bedtime as needed for sleep.  ? mesalamine (CANASA) 1000 MG suppository PLACE 1 SUPPOSITORY (1,000 MG TOTAL) RECTALLY AT BEDTIME.  ? Multiple Vitamin (MULTIVITAMIN) tablet Take 4 tablets by mouth daily.  ? ondansetron (ZOFRAN) 4 MG tablet Take 1 tablet (4 mg total) by mouth daily as needed for nausea or vomiting.  ? predniSONE (DELTASONE) 20 MG tablet Take 2 tablets (40 mg total) by mouth daily with breakfast. 40 mg daily  ? predniSONE (DELTASONE) 5 MG tablet Please use in your prednisone taper as instructed.  ? promethazine (PHENERGAN) 12.5 MG tablet Take 1 tablet (12.5 mg total) by mouth daily as needed for nausea or vomiting.  ? ?No facility-administered encounter medications on file as of 02/21/2022.  ? ? ?Allergies as of 02/21/2022 - Review Complete 02/09/2022  ?Allergen Reaction Noted  ? Sulfa antibiotics Hives 04/02/2013  ? Latex Rash 06/09/2019  ? Wound dressing adhesive Rash 10/16/2021  ? ? ?Past Medical History:  ?Diagnosis Date  ? Anal fissure   ? Anemia   ? Anxiety   ?  Hemorrhoid   ? Ulcerative colitis ([place])   ? ? ?Past Surgical History:  ?Procedure Laterality Date  ? BIOPSY  01/14/2022  ? Procedure: BIOPSY;  Surgeon: Milus Banister, MD;  Location: [facility];  Service: Endoscopy;;  ? CESAREAN SECTION  09/11/2021  ? COSMETIC SURGERY  12/18/2010  ? nose  ? ESOPHAGOGASTRODUODENOSCOPY (EGD) WITH PROPOFOL N/A 01/14/2022  ? Procedure: ESOPHAGOGASTRODUODENOSCOPY (EGD) WITH PROPOFOL;  Surgeon: Milus Banister, MD;  Location: [facility];  Service: Endoscopy;  Laterality: N/A;  ? FLEXIBLE SIGMOIDOSCOPY N/A 01/14/2022  ? Procedure: FLEXIBLE SIGMOIDOSCOPY;  Surgeon: Milus Banister, MD;  Location: [facility];  Service: Endoscopy;  Laterality: N/A;  ? MYOMECTOMY N/A 06/16/2019  ? Procedure: ABDOMINAL MYOMECTOMY;  Surgeon: Molli Posey, MD;  Location: [facility];  Service: Gynecology;  Laterality: N/A;  ? WISDOM TOOTH EXTRACTION    ? ? ?Family History  ?Problem Relation Age of Onset  ? Hypertension Mother   ? Obesity Father   ? Hypertension Brother   ? Lung cancer Maternal Grandmother   ? Lung cancer Maternal Grandfather   ? ? ?Social History  ? ?Socioeconomic History  ? Marital status: Married  ?  Spouse name: Not on file  ? Number of children: 2  ? Years of education: Not on file  ? Highest education level: Not on file  ?Occupational History  ? Occupation: Pharmacist, hospital  ?Tobacco Use  ? Smoking status: Former  ?  Types: Cigarettes  ?  Quit date: 06/11/2017  ?  Years since quitting: 4.7  ? Smokeless tobacco: Never  ?Vaping Use  ? Vaping Use: Former  ? Quit date: 10/11/2018  ?Substance and Sexual Activity  ? Alcohol use: Not Currently  ?  Comment: occ  ? Drug use: Never  ? Sexual activity: Not on file  ?Other Topics Concern  ? Not on file  ?Social History Narrative  ? Not on file  ? ?Social Determinants of Health  ? ?Financial Resource Strain: Not on file  ?Food Insecurity: Not on file  ?Transportation Needs: Not on file  ?Physical Activity: Not on file  ?Stress: Not on file  ?Social Connections: Not on file  ?Intimate Partner Violence: Not on file  ? ? ? ? ?Review of systems: ?All other review of systems negative except as mentioned in the HPI. ? ? ?Physical Exam: ?Vitals:  ? 02/21/22 1434  ?BP: 112/64  ?Pulse: 70  ? ?Body mass index is 22.31 kg/m?. ?Gen:      No acute distress ?HEENT:  sclera anicteric ?Abd:      soft, non-tender; no palpable masses, no distension ?Ext:    No edema ?Neuro: alert and  oriented x 3 ?Psych: normal mood and affect ? ?Data Reviewed: ? ?Reviewed labs, radiology imaging, old records and pertinent past GI work up ? ? ?Assessment and Plan/Recommendations: ? ?31 year old very pleasant female with severe ulcerative pancolitis, recent hospitalization February 2023 with acute flare ?Symptoms overall improving on weekly Humira ? ?Continue current regimen with Humira injection weekly ?Taper down prednisone, advised patient to decrease the dose to 10 mg daily for 5 days and then use 5 mg daily for additional 5 days and stop ? ?Complains of excessive hair loss: Advised patient to use prenatal multivitamin daily for 3 months and take vitamin D 800 international units in addition to daily iron tablets for iron deficiency anemia ? ?Check Humira drug antibody level and drug trough in 3 to 4 weeks ?Follow-up ESR, CRP, hemoglobin and hematocrit ? ?We  will plan for colonoscopy in 3 to 4 months to assess disease activity ? ?Plan for pneumococcal vaccination at next follow-up visit in 2 months ? ?She will also need bone density scan ? ?Return in 2 months ? ?This visit required 40 minutes of patient care (this includes precharting, chart review, review of results, face-to-face time used for counseling as well as treatment plan and follow-up. The patient was provided an opportunity to ask questions and all were answered. The patient agreed with the plan and demonstrated an understanding of the instructions. ? ?K. Denzil Magnuson , MD ?  ? ?CC: Mitchell, L.Marlou Sa, MD ? ? ?

## 2022-02-21 NOTE — Patient Instructions (Signed)
You will need labs drawn in 3-4 weeks, come to the lab the orders are already in. ? ?Use Prenatal multivitamins and Vitamin D 800 IU and Iron tablet ? ?Continue Humira weekly ? ?If you are age 33 or older, your body mass index should be between 23-30. Your Body mass index is 22.31 kg/m?Marland Kitchen If this is out of the aforementioned range listed, please consider follow up with your Primary Care Provider. ? ?If you are age 75 or younger, your body mass index should be between 19-25. Your Body mass index is 22.31 kg/m?Marland Kitchen If this is out of the aformentioned range listed, please consider follow up with your Primary Care Provider.  ? ?________________________________________________________ ? ?The [facility] providers would like to encourage you to use [facility] to communicate with providers for non-urgent requests or questions.  Due to long hold times on the telephone, sending your provider a message by [facility] may be a faster and more efficient way to get a response.  Please allow 48 business hours for a response.  Please remember that this is for non-urgent requests.  ?_______________________________________________________  ? ?I appreciate the  opportunity to care for you ? ?Thank You  ? ?Harl Bowie , MD  ?

## 2022-03-14 ENCOUNTER — Other Ambulatory Visit (INDEPENDENT_AMBULATORY_CARE_PROVIDER_SITE_OTHER): Payer: BC Managed Care – PPO

## 2022-03-14 DIAGNOSIS — K5100 Ulcerative (chronic) pancolitis without complications: Secondary | ICD-10-CM

## 2022-03-14 DIAGNOSIS — D508 Other iron deficiency anemias: Secondary | ICD-10-CM

## 2022-03-14 LAB — HEMATOCRIT: HCT: 35.8 % — ABNORMAL LOW (ref 36.0–46.0)

## 2022-03-14 LAB — HIGH SENSITIVITY CRP: CRP, High Sensitivity: 30.64 mg/L — ABNORMAL HIGH (ref 0.000–5.000)

## 2022-03-14 LAB — HEMOGLOBIN: Hemoglobin: 11.9 g/dL — ABNORMAL LOW (ref 12.0–15.0)

## 2022-03-14 LAB — SEDIMENTATION RATE: Sed Rate: 40 mm/hr — ABNORMAL HIGH (ref 0–20)

## 2022-03-16 ENCOUNTER — Telehealth: Payer: Self-pay | Admitting: Gastroenterology

## 2022-03-16 NOTE — Telephone Encounter (Signed)
Please advise 

## 2022-03-16 NOTE — Telephone Encounter (Signed)
Patient states she was just speaking with Dr. Silverio Decamp and she had asked her if anything was different.  After she got off the phone, she started thinking about it and said she has been hurting for the last three weeks in her knees and arms and wasn't sure if that may be a side effect of the Humira.  She wanted to call and let you know.  Thank you. ?

## 2022-03-16 NOTE — Telephone Encounter (Signed)
Humira can cause arthralgia but is a rare side effect, we will continue to monitor.  Awaiting Humira drug trough level and antibody level.  Please advise patient to call back if she develops worsening symptoms.  Thank you ?

## 2022-03-16 NOTE — Telephone Encounter (Signed)
Spoke with the patient. She will continue to monitor. Reports it is upper and lower joints. Discomfort with stairs and lifting.  ?

## 2022-03-21 LAB — SERIAL MONITORING

## 2022-03-22 LAB — ADALIMUMAB+AB (SERIAL MONITOR)
Adalimumab Drug Level: 7.6 ug/mL
Anti-Adalimumab Antibody: 70 ng/mL

## 2022-03-30 IMAGING — CT CT CHEST-ABD-PELV W/ CM
2 of 4 series · 13 of 36 positions shown, 15 images · IV contrast (agent unspecified)
Comparison: 10/16/2021

CLINICAL DATA: Ulcerative colitis, bright red blood per rectum,
anemia, lower extremity edema

EXAM:
CT CHEST, ABDOMEN, AND PELVIS WITH CONTRAST
TECHNIQUE: Multidetector CT imaging of the chest, abdomen and pelvis was
performed following the standard protocol during bolus
administration of intravenous contrast.

[Series 2: cap with · axial · 0.79mm/px · z∈[+1101,+1646]mm · 10 of 129 slices shown, 12 images]
[im 10/129  mediastinal]
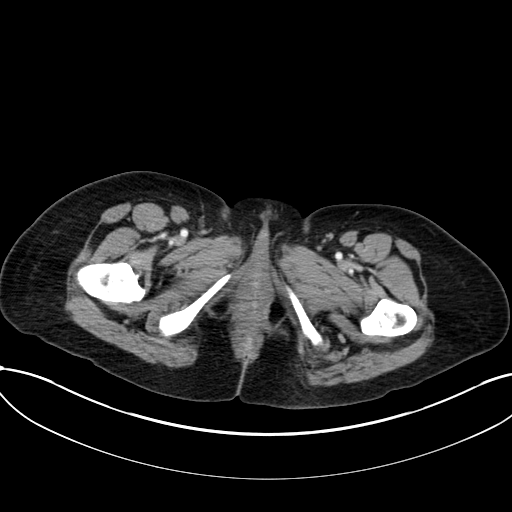
[im 10/129  bone]
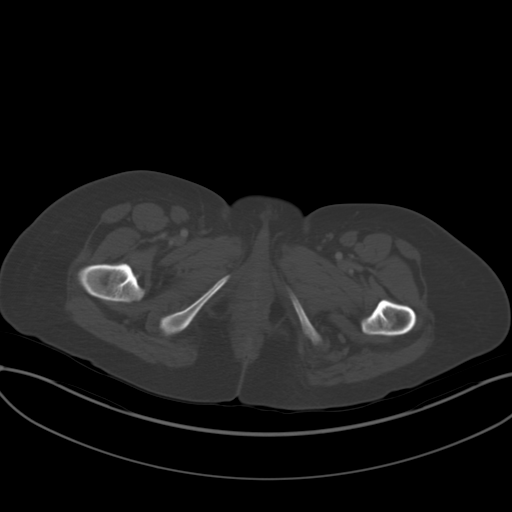
[im 20/129  mediastinal]
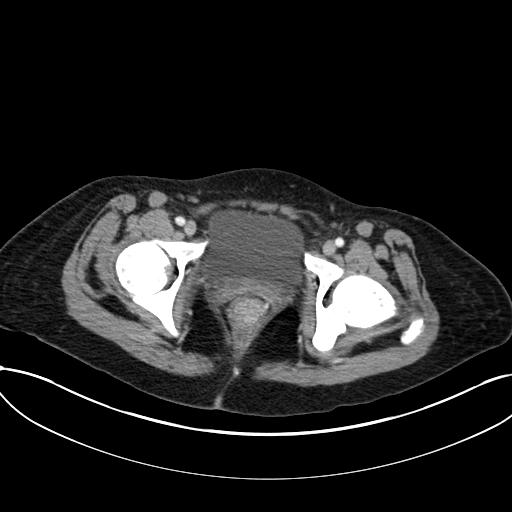
[im 40/129  mediastinal]
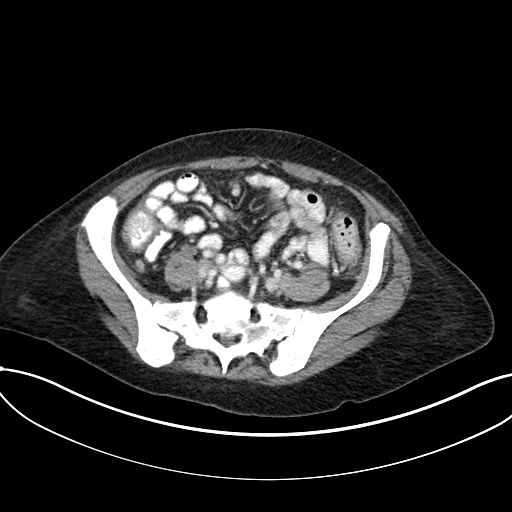
[im 50/129  mediastinal]
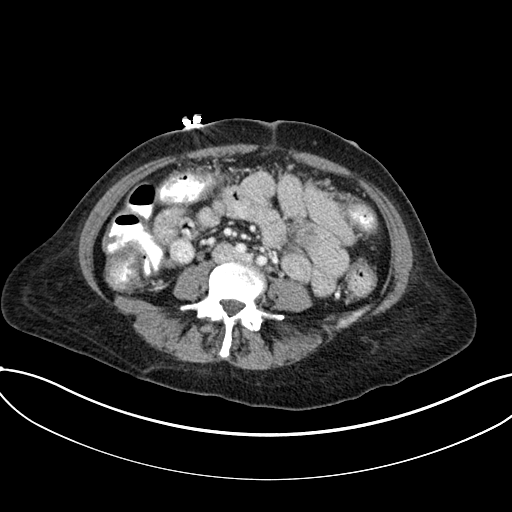
[im 60/129  mediastinal]
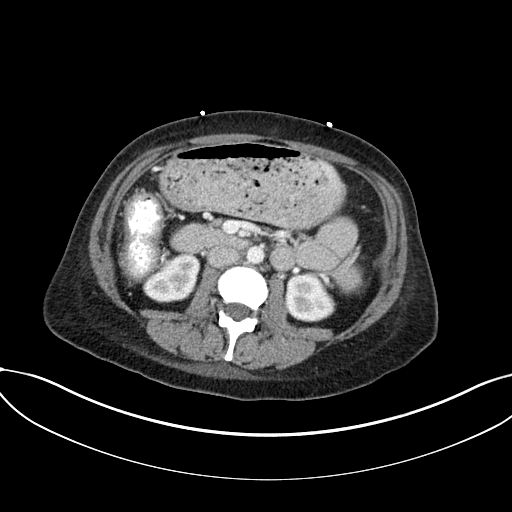
[im 69/129  mediastinal]
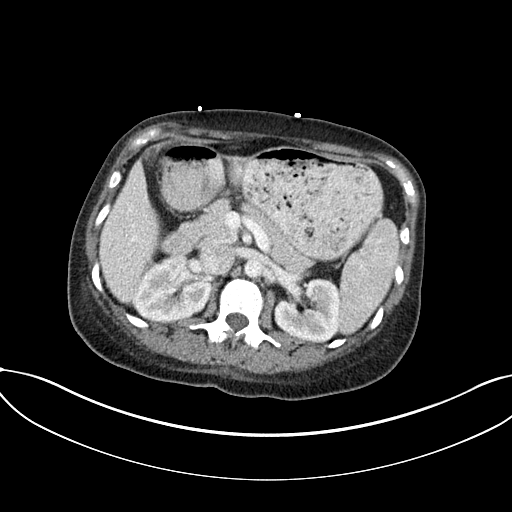
[im 79/129  mediastinal]
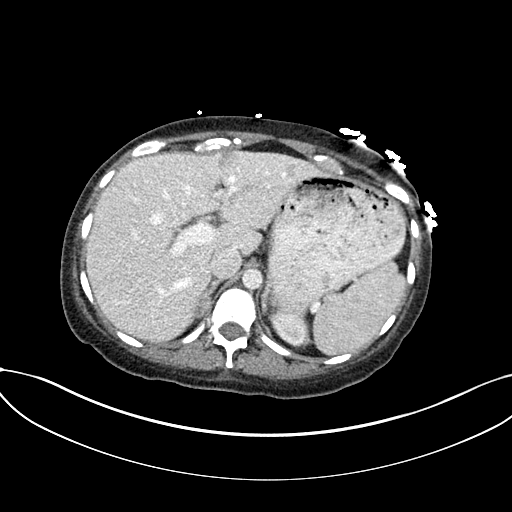
[im 99/129  mediastinal]
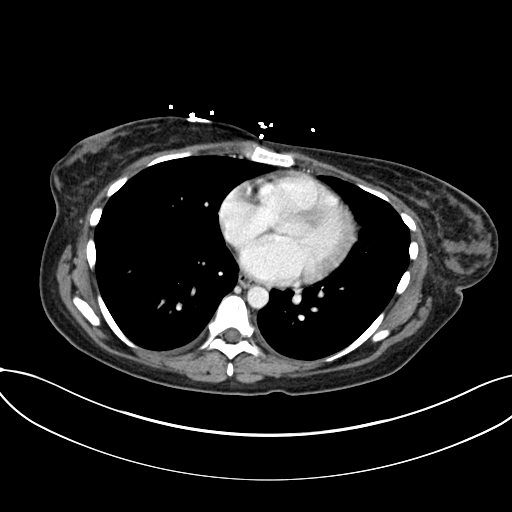
[im 109/129  mediastinal]
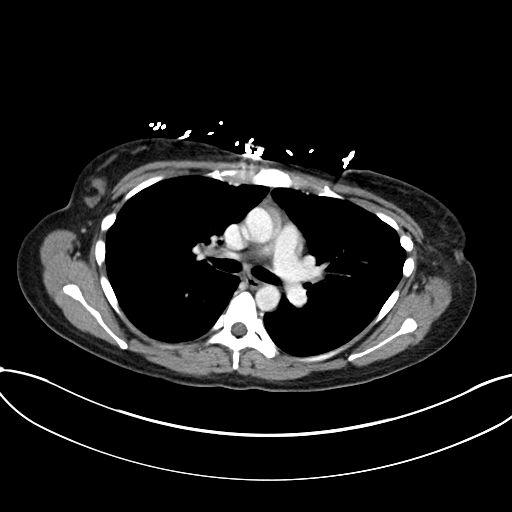
[im 109/129  bone]
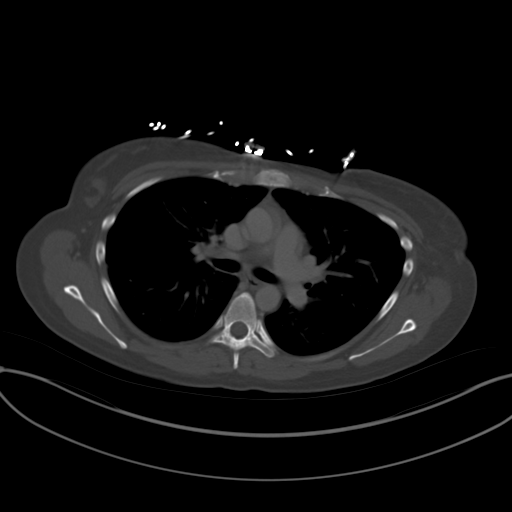
[im 119/129  mediastinal]
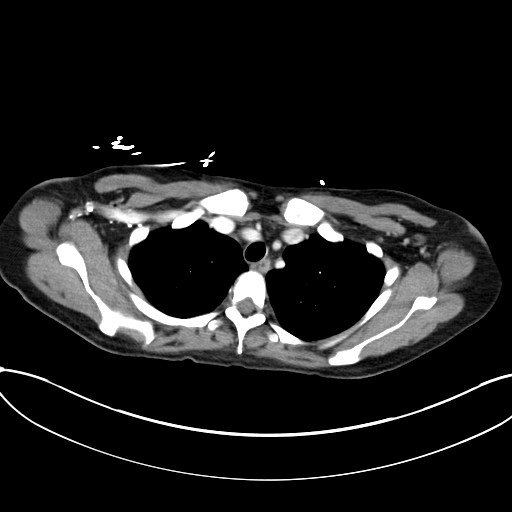

[Series 5: coronals · coronal · 0.73mm/px · 3 of 127 slices shown]
[im 26/127  mediastinal]
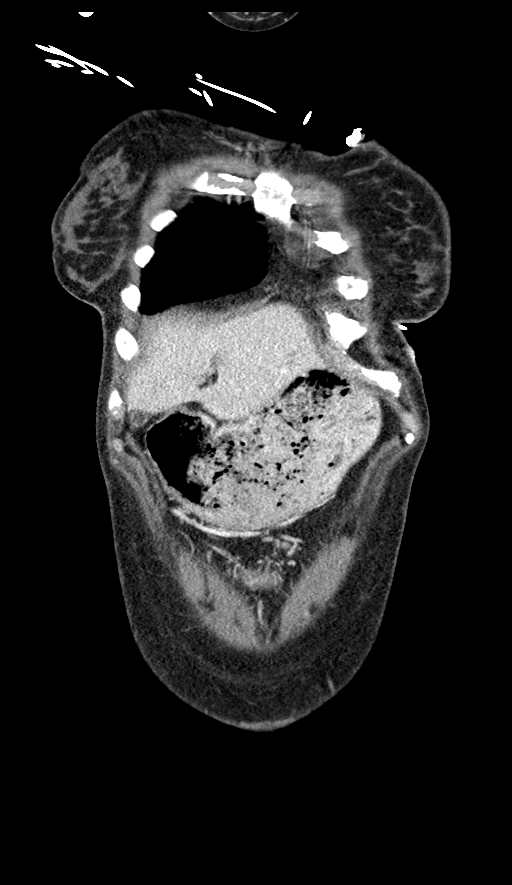
[im 51/127  mediastinal]
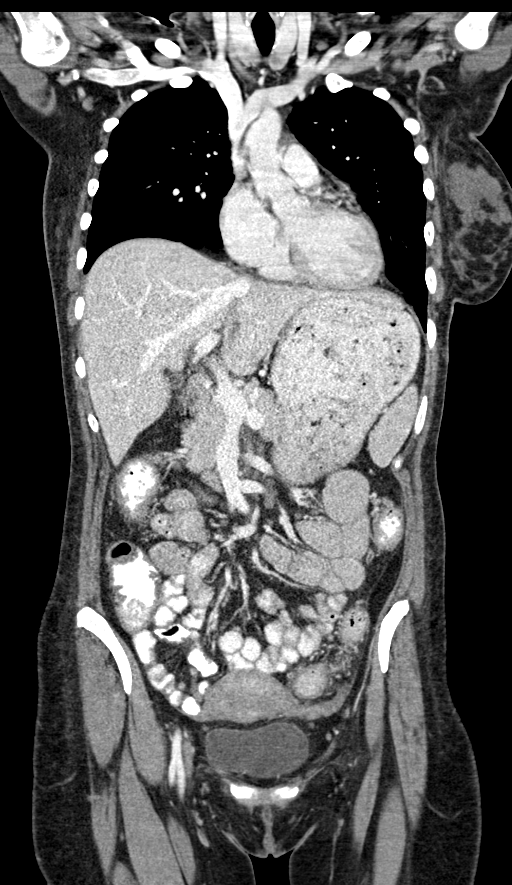
[im 76/127  mediastinal]
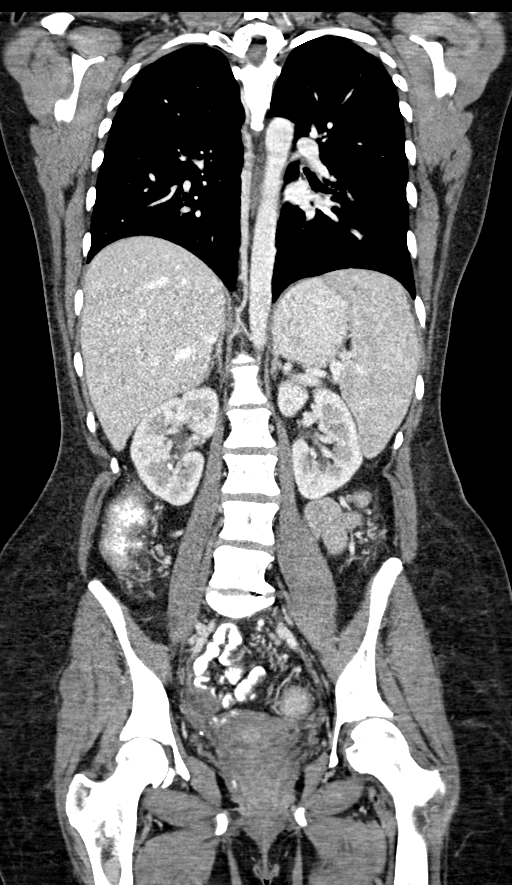

[13 of 36 positions shown; findings below may reference images not displayed]

RADIATION DOSE REDUCTION: This exam was performed according to the
departmental dose-optimization program which includes automated
exposure control, adjustment of the mA and/or kV according to
patient size and/or use of iterative reconstruction technique.

CONTRAST:  100mL OMNIPAQUE IOHEXOL 300 MG/ML  SOLN
FINDINGS: CT CHEST FINDINGS

Cardiovascular: The heart and great vessels are unremarkable without
pericardial effusion. No evidence of thoracic aortic aneurysm or
dissection.

Mediastinum/Nodes: No enlarged mediastinal, hilar, or axillary lymph
nodes. Thyroid gland, trachea, and esophagus demonstrate no
significant findings.

Lungs/Pleura: No acute airspace disease, effusion, or pneumothorax.
Central airways are patent.

4 mm left upper lobe solid pulmonary nodule reference image 58/3. No
other nodules or masses.

Musculoskeletal: No acute or destructive bony lesions. Reconstructed
images demonstrate no additional findings.

CT ABDOMEN PELVIS FINDINGS

Hepatobiliary: No focal liver abnormality is seen. No gallstones,
gallbladder wall thickening, or biliary dilatation.

Pancreas: Unremarkable. No pancreatic ductal dilatation or
surrounding inflammatory changes.

Spleen: Borderline splenomegaly, measuring 8.8 x 4.3 x 13.7 cm
corresponding to 518 cc. No focal parenchymal abnormality.

Adrenals/Urinary Tract: Grossly stable right adrenal adenoma
measuring approximately 1.8 x 1.1 cm. The left adrenals
unremarkable.

Nonobstructing calculi lower pole right kidney unchanged, measuring
less than 3 mm. No left-sided calculi. No obstructive uropathy
within either kidney. Bladder is unremarkable.

Stomach/Bowel: There is diffuse colonic wall thickening extending
from the cecum through the rectum, consistent with patient's history
of ulcerative colitis. The appendix appears unremarkable. No bowel
obstruction or ileus.

Vascular/Lymphatic: No significant vascular findings are present. No
enlarged abdominal or pelvic lymph nodes.

Reproductive: Uterus and bilateral adnexa are unremarkable.

Other: No free fluid or free gas.  No abdominal wall hernia.

Musculoskeletal: No acute or destructive bony lesions. Stable
spondylosis at the lumbosacral junction. Subcutaneous gas within the
anterior proximal left thigh may reflect recent subcutaneous
injection. Reconstructed images demonstrate no additional findings.
IMPRESSION: 1. Diffuse colonic wall thickening consistent with ulcerative
colitis.
2. Borderline splenomegaly.
3. 4 mm left upper lobe pulmonary nodule. No follow-up needed if
patient is low-risk. Non-contrast chest CT can be considered in 12
months if patient is high-risk. This recommendation follows the
consensus statement: Guidelines for Management of Incidental
Pulmonary Nodules Detected on CT Images: From the [HOSPITAL]
4. Nonobstructing less than 3 mm right renal calculi.
5. Stable right adrenal adenoma.

## 2022-04-26 ENCOUNTER — Other Ambulatory Visit: Payer: Self-pay

## 2022-04-26 ENCOUNTER — Other Ambulatory Visit (INDEPENDENT_AMBULATORY_CARE_PROVIDER_SITE_OTHER): Payer: BC Managed Care – PPO

## 2022-04-26 ENCOUNTER — Telehealth: Payer: Self-pay | Admitting: Gastroenterology

## 2022-04-26 DIAGNOSIS — Z5181 Encounter for therapeutic drug level monitoring: Secondary | ICD-10-CM

## 2022-04-26 DIAGNOSIS — K51918 Ulcerative colitis, unspecified with other complication: Secondary | ICD-10-CM

## 2022-04-26 LAB — HIGH SENSITIVITY CRP: CRP, High Sensitivity: 15.02 mg/L — ABNORMAL HIGH (ref 0.000–5.000)

## 2022-04-26 LAB — SEDIMENTATION RATE: Sed Rate: 41 mm/hr — ABNORMAL HIGH (ref 0–20)

## 2022-04-26 NOTE — Telephone Encounter (Signed)
Patient called stating that she had an upcoming appointment with Dr. Silverio Decamp and that she was supposed to get bloodwork done prior.  She would like to be able to go this afternoon if the orders can be put in for her.  Please call patient and advise.  Thank you. ?

## 2022-04-26 NOTE — Telephone Encounter (Signed)
Spoke with the patient. She will come for the drug level monitoring of Humira, an ESR and CRP today. She reports she is doing well and has not had any bleeding.  ?

## 2022-04-27 ENCOUNTER — Other Ambulatory Visit: Payer: Self-pay | Admitting: *Deleted

## 2022-04-27 ENCOUNTER — Encounter: Payer: Self-pay | Admitting: Gastroenterology

## 2022-04-27 ENCOUNTER — Telehealth: Payer: Self-pay | Admitting: Gastroenterology

## 2022-04-27 DIAGNOSIS — K51918 Ulcerative colitis, unspecified with other complication: Secondary | ICD-10-CM

## 2022-04-27 DIAGNOSIS — D649 Anemia, unspecified: Secondary | ICD-10-CM

## 2022-04-27 DIAGNOSIS — K51011 Ulcerative (chronic) pancolitis with rectal bleeding: Secondary | ICD-10-CM

## 2022-04-27 DIAGNOSIS — R770 Abnormality of albumin: Secondary | ICD-10-CM

## 2022-04-27 DIAGNOSIS — D508 Other iron deficiency anemias: Secondary | ICD-10-CM

## 2022-04-27 DIAGNOSIS — Z5181 Encounter for therapeutic drug level monitoring: Secondary | ICD-10-CM

## 2022-04-27 NOTE — Telephone Encounter (Signed)
Spoke with the patient

## 2022-04-27 NOTE — Progress Notes (Signed)
She needs a Bone Density scan on 5/16  Orders are in ?

## 2022-04-27 NOTE — Telephone Encounter (Signed)
Patient called regarding bloodwork she had done yesterday.  She received her results in My Chart and is very concerned and wants to know what can be done about them.  She would like to discuss these results with you or Dr. Silverio Decamp and asked if there was a possibility she could be called sometime today.  I explained that Dr. Silverio Decamp may not have had a chance to review them as of yet and that once she had someone would be in touch.  Please call patient and advise.  Thank you. ?

## 2022-05-02 ENCOUNTER — Ambulatory Visit (INDEPENDENT_AMBULATORY_CARE_PROVIDER_SITE_OTHER): Payer: BC Managed Care – PPO | Admitting: Gastroenterology

## 2022-05-02 ENCOUNTER — Other Ambulatory Visit: Payer: BC Managed Care – PPO

## 2022-05-02 ENCOUNTER — Encounter: Payer: Self-pay | Admitting: Gastroenterology

## 2022-05-02 ENCOUNTER — Ambulatory Visit (INDEPENDENT_AMBULATORY_CARE_PROVIDER_SITE_OTHER)
Admission: RE | Admit: 2022-05-02 | Discharge: 2022-05-02 | Disposition: A | Discharge status: Home / Self Care | Payer: BC Managed Care – PPO | Source: Ambulatory Visit | Attending: Gastroenterology | Admitting: Gastroenterology

## 2022-05-02 VITALS — BP 124/74 | HR 105 | Ht 62.0 in | Wt 121.8 lb

## 2022-05-02 DIAGNOSIS — M5440 Lumbago with sciatica, unspecified side: Secondary | ICD-10-CM | POA: Diagnosis not present

## 2022-05-02 DIAGNOSIS — M25561 Pain in right knee: Secondary | ICD-10-CM | POA: Diagnosis not present

## 2022-05-02 DIAGNOSIS — R5383 Other fatigue: Secondary | ICD-10-CM

## 2022-05-02 DIAGNOSIS — K51011 Ulcerative (chronic) pancolitis with rectal bleeding: Secondary | ICD-10-CM

## 2022-05-02 DIAGNOSIS — M25562 Pain in left knee: Secondary | ICD-10-CM

## 2022-05-02 DIAGNOSIS — G8929 Other chronic pain: Secondary | ICD-10-CM

## 2022-05-02 NOTE — Patient Instructions (Signed)
You have been scheduled for a bone density test today . Please arrive 15 minutes prior to your scheduled appointment to radiology on the basement floor of [facility] location for this test. If you need to cancel or reschedule for any reason, please contact radiology at [phone] ? ?Preparation for test is as follows: ? ?If you are taking calcium, discontinue this 24-48 hours prior to your appointment. ? ?Wear pants with an elastic waistband (or without any metal such as a zipper). ? ?Do not wear an underwire bra. ? ?We do have gowns if you are unable to find appropriate clothing without metal. ? ?Please bring a list of all current medications. ? ?Your provider has requested that you go to the basement level for lab work before leaving today. Press "B" on the elevator. The lab is located at the first door on the left as you exit the elevator.  Fecal Calprotectin ? ?We will refer you to Dr Estanislado Pandy for joint pain  ? ?You will need labs drawn the day you have your colonoscopy ? ?Due to recent changes in healthcare laws, you may see the results of your imaging and laboratory studies on MyChart before your provider has had a chance to review them.  We understand that in some cases there may be results that are confusing or concerning to you. Not all laboratory results come back in the same time frame and the provider may be waiting for multiple results in order to interpret others.  Please give [place] 48 hours in order for your provider to thoroughly review all the results before contacting the office for clarification of your results.   ? ?You have been scheduled for a colonoscopy. Please follow written instructions given to you at your visit today.  ?Please pick up your prep supplies at the pharmacy within the next 1-3 days. ?If you use inhalers (even only as needed), please bring them with you on the day of your procedure.  ? ?I appreciate the  opportunity to care for you ? ?Thank You  ? ?Harl Bowie  , MD  ? ?  ?

## 2022-05-02 NOTE — Progress Notes (Signed)
? ?       ? ?Gloria Green    [id number]    January 28, 1991 ? ?Primary Care Physician:Mitchell, L.Marlou Sa, MD ? ?Referring Physician: Alroy Dust, L.Marlou Sa, MD ?[address] ?[address] ?[place],  [place] [address] ? ? ?Chief complaint:  UC ? ?HPI: ? ?31 year old very pleasant female here for follow-up visit for ulcerative colitis  ? ?Is currently on weekly Humira.  She has tapered off prednisone.  She forgot Humira injection last week, delayed it by 24 hours and she noticed small volume blood mixed in her stool which improved after she did the Humira injection. ? ?On average she is having 2-3 formed bowel movements with no blood in stool.  No abdominal pain, vomiting or weight loss. ?He is experiencing severe joint pain in her knees and her back.  She also mentions a nodule on her arm that was reddish purple and painful ?She continues to experience severe fatigue though slightly better since she started taking prenatal vitamins and vitamin D supplements ?  ?Flexible sigmoidoscopy January 14, 2022 ?- Severe inflammation from anus to the extent of the exam (mid sigmoid colon), ?characterized by ulceration, pseudopolyps, very friable, congested mucosa. This was ?biopsied for histology and also for viral culture. ?- Small internal hemorrhoids. ?  ?EGD 01/14/22 ?- No fresh or old blood in the UGI tract. ?- Retained solid food in the stomach without anatomic outlet obstruction, suggesting a ?component of slow gastric emptying. ?  ?CT chest abdomen and pelvis with contrast January 13, 2022 ?1. Diffuse colonic wall thickening consistent with ulcerative ?colitis. ?2. Borderline splenomegaly. ?3. 4 mm left upper lobe pulmonary nodule. No follow-up needed if ?patient is low-risk. Non-contrast chest CT can be considered in 12 ?months if patient is high-risk. This recommendation follows the ?consensus statement: Guidelines for Management of Incidental ?Pulmonary Nodules Detected on CT Images: From the Fleischner Society ?2017;  Radiology 2017; [id number]. ?4. Nonobstructing less than 3 mm right renal calculi. ?5. Stable right adrenal adenoma. ?  ?  ? ? ?Outpatient Encounter Medications as of 05/02/2022  ?Medication Sig  ? Adalimumab (HUMIRA PEN) 40 MG/0.4ML PNKT Inject 40 mg into the skin once a week.  ? cholecalciferol (VITAMIN D3) 25 MCG (1000 UNIT) tablet Take 1,000 Units by mouth daily.  ? Multiple Vitamin (MULTIVITAMIN) tablet Take 4 tablets by mouth daily. Prenatal Vitamin  ? ondansetron (ZOFRAN) 4 MG tablet Take 1 tablet (4 mg total) by mouth daily as needed for nausea or vomiting.  ? promethazine (PHENERGAN) 12.5 MG tablet Take 1 tablet (12.5 mg total) by mouth daily as needed for nausea or vomiting.  ? [DISCONTINUED] citalopram (CELEXA) 20 MG tablet Take 20 mg by mouth daily. (Patient not taking: Reported on 05/02/2022)  ? [DISCONTINUED] ferrous sulfate 325 (65 FE) MG tablet Take 1 tablet (325 mg total) by mouth every other day. (Patient not taking: Reported on 05/02/2022)  ? [DISCONTINUED] LORazepam (ATIVAN) 0.5 MG tablet Take 1 tablet (0.5 mg total) by mouth at bedtime as needed for sleep. (Patient not taking: Reported on 05/02/2022)  ? [DISCONTINUED] predniSONE (DELTASONE) 20 MG tablet Take 2 tablets (40 mg total) by mouth daily with breakfast. 40 mg daily (Patient not taking: Reported on 05/02/2022)  ? ?No facility-administered encounter medications on file as of 05/02/2022.  ? ? ?Allergies as of 05/02/2022 - Review Complete 05/02/2022  ?Allergen Reaction Noted  ? Sulfa antibiotics Hives 04/02/2013  ? Latex Rash 06/09/2019  ? Wound dressing adhesive Rash 10/16/2021  ? ? ?Past Medical History:  ?  Diagnosis Date  ? Anal fissure   ? Anemia   ? Anxiety   ? Hemorrhoid   ? Ulcerative colitis ([place])   ? ? ?Past Surgical History:  ?Procedure Laterality Date  ? BIOPSY  01/14/2022  ? Procedure: BIOPSY;  Surgeon: Milus Banister, MD;  Location: [facility];  Service: Endoscopy;;  ? CESAREAN SECTION  09/11/2021  ? COSMETIC SURGERY  12/18/2010   ? nose  ? ESOPHAGOGASTRODUODENOSCOPY (EGD) WITH PROPOFOL N/A 01/14/2022  ? Procedure: ESOPHAGOGASTRODUODENOSCOPY (EGD) WITH PROPOFOL;  Surgeon: Milus Banister, MD;  Location: [facility];  Service: Endoscopy;  Laterality: N/A;  ? FLEXIBLE SIGMOIDOSCOPY N/A 01/14/2022  ? Procedure: FLEXIBLE SIGMOIDOSCOPY;  Surgeon: Milus Banister, MD;  Location: [facility];  Service: Endoscopy;  Laterality: N/A;  ? MYOMECTOMY N/A 06/16/2019  ? Procedure: ABDOMINAL MYOMECTOMY;  Surgeon: Molli Posey, MD;  Location: [facility];  Service: Gynecology;  Laterality: N/A;  ? WISDOM TOOTH EXTRACTION    ? ? ?Family History  ?Problem Relation Age of Onset  ? Hypertension Mother   ? Obesity Father   ? Hypertension Brother   ? Lung cancer Maternal Grandmother   ? Lung cancer Maternal Grandfather   ? ? ?Social History  ? ?Socioeconomic History  ? Marital status: Married  ?  Spouse name: Not on file  ? Number of children: 2  ? Years of education: Not on file  ? Highest education level: Not on file  ?Occupational History  ? Occupation: Pharmacist, hospital  ?Tobacco Use  ? Smoking status: Former  ?  Types: Cigarettes  ?  Quit date: 06/11/2017  ?  Years since quitting: 4.8  ? Smokeless tobacco: Never  ?Vaping Use  ? Vaping Use: Former  ? Quit date: 10/11/2018  ?Substance and Sexual Activity  ? Alcohol use: Not Currently  ?  Comment: occ  ? Drug use: Never  ? Sexual activity: Yes  ?Other Topics Concern  ? Not on file  ?Social History Narrative  ? Not on file  ? ?Social Determinants of Health  ? ?Financial Resource Strain: Not on file  ?Food Insecurity: Not on file  ?Transportation Needs: Not on file  ?Physical Activity: Not on file  ?Stress: Not on file  ?Social Connections: Not on file  ?Intimate Partner Violence: Not on file  ? ? ? ? ?Review of systems: ?All other review of systems negative except as mentioned in the HPI. ? ? ?Physical Exam: ?Vitals:  ? 05/02/22 1410  ?BP: 124/74  ?Pulse: (!) 105  ?SpO2: 94%  ? ?Body mass index is 22.28  kg/m?. ?Gen:      No acute distress ?HEENT:  sclera anicteric ?Abd:      soft, non-tender; no palpable masses, no distension ?Ext:    No edema ?Neuro: alert and oriented x 3 ?Psych: normal mood and affect ? ?Data Reviewed: ? ?Reviewed labs, radiology imaging, old records and pertinent past GI work up ? ? ?Assessment and Plan/Recommendations: ? ?31 year old very pleasant female with severe ulcerative pancolitis, recent hospitalization February 2023 with acute flare ?Symptoms overall improving on weekly Humira.  CRP and sed rate remain elevated, has not trended down to normal range yet ?We will check fecal calprotectin ?Continue current regimen with Humira injection weekly ? ?If calprotectin is significantly elevated, will plan to switch therapy to either Rinvoq or Skyrizi to lack of response to anti-TNF based on insurance approval.  She is > 12 weeks since initiation of Humira ?  ?Fatigue and excessive hair loss: Slightly improved  on prenatal vitamins and vitamin D  ? ?IBD health maintenance: She is due for colonoscopy to check disease activity and extent of colitis, schedule it  ?The risks and benefits as well as alternatives of endoscopic procedure(s) have been discussed and reviewed. All questions answered. The patient agrees to proceed. ? ?Check CBC, CMP, ESR, CRP, B12, folate, iron panel, vitamin D level in 4 to 6 weeks  ? ?Follow-up bone density scan ? ?Vaccination status: Pneumococcal and zoster pending ?  ?Severe joint pain, back pain and skin nodules concerning for inflammatory nodules, will refer to rheumatology for further evaluation to exclude rheumatoid arthritis or ankylosing spondylosis ? ?Return in 2 months ? ? The patient was provided an opportunity to ask questions and all were answered. The patient agreed with the plan and demonstrated an understanding of the instructions. ? ?K. Denzil Magnuson , MD ?  ? ?CC: Mitchell, L.Marlou Sa, MD ?  ?

## 2022-05-05 DIAGNOSIS — K51011 Ulcerative (chronic) pancolitis with rectal bleeding: Secondary | ICD-10-CM | POA: Diagnosis not present

## 2022-05-05 LAB — ADALIMUMAB+AB (SERIAL MONITOR)
Adalimumab Drug Level: 13 ug/mL
Anti-Adalimumab Antibody: <25 ng/mL

## 2022-05-05 LAB — SERIAL MONITORING

## 2022-05-10 ENCOUNTER — Encounter: Payer: Self-pay | Admitting: Gastroenterology

## 2022-05-16 ENCOUNTER — Other Ambulatory Visit: Payer: BC Managed Care – PPO

## 2022-05-16 DIAGNOSIS — K51011 Ulcerative (chronic) pancolitis with rectal bleeding: Secondary | ICD-10-CM

## 2022-05-18 ENCOUNTER — Other Ambulatory Visit: Payer: Self-pay | Admitting: *Deleted

## 2022-05-18 DIAGNOSIS — D508 Other iron deficiency anemias: Secondary | ICD-10-CM

## 2022-05-18 DIAGNOSIS — R5383 Other fatigue: Secondary | ICD-10-CM

## 2022-05-18 DIAGNOSIS — K51011 Ulcerative (chronic) pancolitis with rectal bleeding: Secondary | ICD-10-CM

## 2022-05-18 DIAGNOSIS — M2550 Pain in unspecified joint: Secondary | ICD-10-CM

## 2022-05-18 NOTE — Progress Notes (Signed)
Called Dr Estanislado Pandy office at [phone] about referral. They said they are scheduling out into December and they are behind with their referrals They will contact the patient to schedule. AMB Referral to Rheumatology had to be put in.

## 2022-05-18 NOTE — Progress Notes (Signed)
Labs are to be drawn the day of her procedure, 07/04/2022

## 2022-05-25 LAB — CALPROTECTIN, FECAL: Calprotectin, Fecal: 68 ug/g (ref 0–120)

## 2022-06-08 ENCOUNTER — Encounter: Payer: Self-pay | Admitting: Gastroenterology

## 2022-06-09 ENCOUNTER — Other Ambulatory Visit (INDEPENDENT_AMBULATORY_CARE_PROVIDER_SITE_OTHER): Payer: BC Managed Care – PPO

## 2022-06-09 DIAGNOSIS — K51011 Ulcerative (chronic) pancolitis with rectal bleeding: Secondary | ICD-10-CM | POA: Diagnosis not present

## 2022-06-09 DIAGNOSIS — R5383 Other fatigue: Secondary | ICD-10-CM | POA: Diagnosis not present

## 2022-06-09 DIAGNOSIS — D508 Other iron deficiency anemias: Secondary | ICD-10-CM

## 2022-06-09 LAB — CBC WITH DIFFERENTIAL/PLATELET
Basophils Absolute: 0 10*3/uL (ref 0.0–0.1)
Basophils Relative: 0.5 % (ref 0.0–3.0)
Eosinophils Absolute: 0.1 10*3/uL (ref 0.0–0.7)
Eosinophils Relative: 1.3 % (ref 0.0–5.0)
HCT: 38.4 % (ref 36.0–46.0)
Hemoglobin: 12.8 g/dL (ref 12.0–15.0)
Lymphocytes Relative: 49.2 % — ABNORMAL HIGH (ref 12.0–46.0)
Lymphs Abs: 3.3 10*3/uL (ref 0.7–4.0)
MCHC: 33.4 g/dL (ref 30.0–36.0)
MCV: 84 fl (ref 78.0–100.0)
Monocytes Absolute: 0.6 10*3/uL (ref 0.1–1.0)
Monocytes Relative: 8.5 % (ref 3.0–12.0)
Neutro Abs: 2.7 10*3/uL (ref 1.4–7.7)
Neutrophils Relative %: 40.5 % — ABNORMAL LOW (ref 43.0–77.0)
Platelets: 257 10*3/uL (ref 150.0–400.0)
RBC: 4.58 Mil/uL (ref 3.87–5.11)
RDW: 14.9 % (ref 11.5–15.5)
WBC: 6.7 10*3/uL (ref 4.0–10.5)

## 2022-06-09 LAB — COMPREHENSIVE METABOLIC PANEL
ALT: 12 U/L (ref 0–35)
AST: 14 U/L (ref 0–37)
Albumin: 4 g/dL (ref 3.5–5.2)
Alkaline Phosphatase: 63 U/L (ref 39–117)
BUN: 19 mg/dL (ref 6–23)
CO2: 28 mEq/L (ref 19–32)
Calcium: 9.3 mg/dL (ref 8.4–10.5)
Chloride: 103 mEq/L (ref 96–112)
Creatinine, Ser: 0.72 mg/dL (ref 0.40–1.20)
GFR: 110.56 mL/min (ref >60.00)
Glucose, Bld: 86 mg/dL (ref 70–99)
Potassium: 4.4 mEq/L (ref 3.5–5.1)
Sodium: 139 mEq/L (ref 135–145)
Total Bilirubin: 0.9 mg/dL (ref 0.2–1.2)
Total Protein: 7.2 g/dL (ref 6.0–8.3)

## 2022-06-09 LAB — SEDIMENTATION RATE: Sed Rate: 32 mm/hr — ABNORMAL HIGH (ref 0–20)

## 2022-06-09 LAB — B12 AND FOLATE PANEL
Folate: >24.2 ng/mL (ref >5.9)
Vitamin B-12: 360 pg/mL (ref 211–911)

## 2022-06-09 LAB — VITAMIN D 25 HYDROXY (VIT D DEFICIENCY, FRACTURES): VITD: 56.5 ng/mL (ref 30.00–100.00)

## 2022-06-19 ENCOUNTER — Encounter: Payer: Self-pay | Admitting: Gastroenterology

## 2022-06-23 ENCOUNTER — Telehealth: Payer: Self-pay | Admitting: *Deleted

## 2022-06-23 NOTE — Telephone Encounter (Signed)
Printed new instructions for Miralax prep for pt  Suprep on Backorder.  Informed pt   through advise request

## 2022-07-01 ENCOUNTER — Other Ambulatory Visit: Payer: Self-pay

## 2022-07-01 ENCOUNTER — Encounter (HOSPITAL_COMMUNITY): Payer: Self-pay

## 2022-07-01 ENCOUNTER — Emergency Department (HOSPITAL_COMMUNITY)
Admission: EM | Admit: 2022-07-01 | Discharge: 2022-07-01 | Disposition: A | Discharge status: Home / Self Care | Payer: BC Managed Care – PPO | Source: Home / Self Care | Attending: Emergency Medicine | Admitting: Emergency Medicine

## 2022-07-01 ENCOUNTER — Ambulatory Visit (HOSPITAL_BASED_OUTPATIENT_CLINIC_OR_DEPARTMENT_OTHER)
Admission: RE | Admit: 2022-07-01 | Discharge: 2022-07-01 | Disposition: A | Discharge status: Home / Self Care | Payer: BC Managed Care – PPO | Source: Ambulatory Visit | Attending: Emergency Medicine | Admitting: Emergency Medicine

## 2022-07-01 ENCOUNTER — Emergency Department (HOSPITAL_COMMUNITY)
Admission: EM | Admit: 2022-07-01 | Discharge: 2022-07-01 | Disposition: A | Discharge status: Home / Self Care | Payer: BC Managed Care – PPO | Attending: Emergency Medicine | Admitting: Emergency Medicine

## 2022-07-01 DIAGNOSIS — Z7901 Long term (current) use of anticoagulants: Secondary | ICD-10-CM | POA: Insufficient documentation

## 2022-07-01 DIAGNOSIS — M25571 Pain in right ankle and joints of right foot: Secondary | ICD-10-CM | POA: Insufficient documentation

## 2022-07-01 DIAGNOSIS — I82401 Acute embolism and thrombosis of unspecified deep veins of right lower extremity: Secondary | ICD-10-CM | POA: Insufficient documentation

## 2022-07-01 DIAGNOSIS — Z79620 Long term (current) use of immunosuppressive biologic: Secondary | ICD-10-CM | POA: Insufficient documentation

## 2022-07-01 DIAGNOSIS — I82441 Acute embolism and thrombosis of right tibial vein: Secondary | ICD-10-CM | POA: Insufficient documentation

## 2022-07-01 DIAGNOSIS — Z91040 Latex allergy status: Secondary | ICD-10-CM | POA: Insufficient documentation

## 2022-07-01 DIAGNOSIS — K5190 Ulcerative colitis, unspecified, without complications: Secondary | ICD-10-CM | POA: Insufficient documentation

## 2022-07-01 DIAGNOSIS — Z87891 Personal history of nicotine dependence: Secondary | ICD-10-CM | POA: Insufficient documentation

## 2022-07-01 DIAGNOSIS — R0989 Other specified symptoms and signs involving the circulatory and respiratory systems: Secondary | ICD-10-CM

## 2022-07-01 DIAGNOSIS — M79609 Pain in unspecified limb: Secondary | ICD-10-CM | POA: Diagnosis not present

## 2022-07-01 DIAGNOSIS — I82409 Acute embolism and thrombosis of unspecified deep veins of unspecified lower extremity: Secondary | ICD-10-CM

## 2022-07-01 DIAGNOSIS — M79669 Pain in unspecified lower leg: Secondary | ICD-10-CM | POA: Insufficient documentation

## 2022-07-01 LAB — D-DIMER, QUANTITATIVE: D-Dimer, Quant: 0.58 ug/mL-FEU — ABNORMAL HIGH (ref 0.00–0.50)

## 2022-07-01 MED ORDER — APIXABAN (ELIQUIS) VTE STARTER PACK (10MG AND 5MG): ORAL_TABLET | ORAL | 0 refills | Status: DC

## 2022-07-01 MED ORDER — CYCLOBENZAPRINE HCL 10 MG PO TABS: 10.0000 mg | ORAL_TABLET | Freq: Two times a day (BID) | ORAL | 0 refills | Status: DC | PRN

## 2022-07-01 MED ORDER — ENOXAPARIN SODIUM 60 MG/0.6ML IJ SOSY
55.0000 mg | PREFILLED_SYRINGE | Freq: Once | INTRAMUSCULAR | Status: AC
  Administered 2022-07-01: 55 mg via SUBCUTANEOUS
  Filled 2022-07-01: qty 0.55

## 2022-07-01 NOTE — ED Triage Notes (Signed)
Pt arrived POV after being seen at [facility] early this morning for right calf pain. Per Vascular imaging she has a DVT in her right leg. Pt states the pain is 7/10.

## 2022-07-01 NOTE — ED Triage Notes (Signed)
Pt reports with right calf pain that has worsened since Thursday. Pt states that she thinks that she has a blood clot due to pain in her calf and because she has ulcerative colitis. Pt reports taking her Humira shot last week and that's when things started.

## 2022-07-01 NOTE — ED Provider Notes (Signed)
[facility] DEPT Provider Note: Georgena Spurling, MD, FACEP  CSN: [id number] MRN: [id number] ARRIVAL: 07/01/22 at [place]: [place]  Leg Pain   HISTORY OF PRESENT ILLNESS  07/01/22 2:56 AM Gloria Green is a 31 y.o. female who is on Humira for ulcerative colitis.  Bad 8 days ago she noticed some mild edema on the lateral aspect of her right ankle with associated pain.  She is not aware of injuring that ankle.  She has subsequently developed worsening pain and swelling in the right lower leg, particularly over the past 2 days.  The pain is now primarily in her right calf.  She rates her pain as a 3 out of 10 at rest and it can be severe when attempting to ambulate.  She has to walk on the lateral aspect of her foot to minimize the pain.  There is no erythema of the leg.  She is having no chest pain or shortness of breath.  She has been taking Tylenol without adequate control of the pain.    Past Medical History:  Diagnosis Date   Anal fissure    Anemia    Anxiety    Hemorrhoid    Ulcerative colitis [facility])     Past Surgical History:  Procedure Laterality Date   BIOPSY  01/14/2022   Procedure: BIOPSY;  Surgeon: Milus Banister, MD;  Location: [facility];  Service: Endoscopy;;   CESAREAN SECTION  09/11/2021   COSMETIC SURGERY  12/18/2010   nose   ESOPHAGOGASTRODUODENOSCOPY (EGD) WITH PROPOFOL N/A 01/14/2022   Procedure: ESOPHAGOGASTRODUODENOSCOPY (EGD) WITH PROPOFOL;  Surgeon: Milus Banister, MD;  Location: [facility];  Service: Endoscopy;  Laterality: N/A;   FLEXIBLE SIGMOIDOSCOPY N/A 01/14/2022   Procedure: FLEXIBLE SIGMOIDOSCOPY;  Surgeon: Milus Banister, MD;  Location: [facility];  Service: Endoscopy;  Laterality: N/A;   MYOMECTOMY N/A 06/16/2019   Procedure: ABDOMINAL MYOMECTOMY;  Surgeon: Molli Posey, MD;  Location: [facility];  Service: Gynecology;  Laterality: N/A;   WISDOM TOOTH EXTRACTION      Family History   Problem Relation Age of Onset   Hypertension Mother    Obesity Father    Hypertension Brother    Lung cancer Maternal Grandmother    Lung cancer Maternal Grandfather     Social History   Tobacco Use   Smoking status: Former    Types: Cigarettes    Quit date: 06/11/2017    Years since quitting: 5.0   Smokeless tobacco: Never  Vaping Use   Vaping Use: Former   Quit date: 10/11/2018  Substance Use Topics   Alcohol use: Not Currently    Comment: occ   Drug use: Never    Prior to Admission medications   Medication Sig Start Date End Date Taking? Authorizing Provider  Adalimumab (HUMIRA PEN) 40 MG/0.4ML PNKT Inject 40 mg into the skin once a week. 01/27/22   Mauri Pole, MD  cholecalciferol (VITAMIN D3) 25 MCG (1000 UNIT) tablet Take 1,000 Units by mouth daily.    [provider]  Multiple Vitamin (MULTIVITAMIN) tablet Take 4 tablets by mouth daily. Prenatal Vitamin    [provider]  ondansetron (ZOFRAN) 4 MG tablet Take 1 tablet (4 mg total) by mouth daily as needed for nausea or vomiting. 12/20/21   Mauri Pole, MD  promethazine (PHENERGAN) 12.5 MG tablet Take 1 tablet (12.5 mg total) by mouth daily as needed for nausea or vomiting. 10/17/21   Mauri Pole,  MD    Allergies Sulfa antibiotics, Latex, and Wound dressing adhesive   REVIEW OF SYSTEMS  Negative except as noted here or in the History of Present Illness.   PHYSICAL EXAMINATION  Initial Vital Signs Blood pressure 134/83, pulse 85, temperature 97.9 F (36.6 C), temperature source Oral, resp. rate 18, SpO2 100 %, currently breastfeeding.  Examination General: Well-developed, well-nourished female in no acute distress; appearance consistent with age of record HENT: normocephalic; atraumatic Eyes: Normal appearance Neck: supple Heart: regular rate and rhythm Lungs: clear to auscultation bilaterally Abdomen: soft; nondistended; nontender; bowel sounds present Extremities:  No deformity; full range of motion; pulses normal; right calf tenderness with trace edema Neurologic: Awake, alert and oriented; motor function intact in all extremities and symmetric; no facial droop Skin: Warm and dry Psychiatric: Normal mood and affect   RESULTS  Summary of this visit's results, reviewed and interpreted by myself:   EKG Interpretation  Date/Time:    Ventricular Rate:    PR Interval:    QRS Duration:   QT Interval:    QTC Calculation:   R Axis:     Text Interpretation:         Laboratory Studies: Results for orders placed or performed during the hospital encounter of 07/01/22 (from the past 24 hour(s))  D-dimer, quantitative     Status: Abnormal   Collection Time: 07/01/22  3:06 AM  Result Value Ref Range   D-Dimer, Quant 0.58 (H) 0.00 - 0.50 ug/mL-FEU   Imaging Studies: No results found.  ED COURSE and MDM  Nursing notes, initial and subsequent vitals signs, including pulse oximetry, reviewed and interpreted by myself.  Vitals:   07/01/22 0234  BP: 134/83  Pulse: 85  Resp: 18  Temp: 97.9 F (36.6 C)  TempSrc: Oral  SpO2: 100%   Medications  enoxaparin (LOVENOX) 100 mg/mL injection 1 mg/kg (has no administration in time range)   Patient's D-dimer is mildly elevated.  We will give her a shot of Lovenox and obtain a venous Doppler ultrasound later today.  PROCEDURES  Procedures   ED DIAGNOSES     ICD-10-CM   1. Suspected deep vein thrombosis  R09.89          Shanon Rosser, MD 07/01/22 0345

## 2022-07-01 NOTE — ED Provider Notes (Signed)
Patient is presenting to the ED after returning for DVT ultrasound today.  She was seen last night for complaint of right calf pain that began a week and a half ago spontaneously.  She denies long periods of immobilization preceding that.  Denies history of clots or family history of clotting disorder.  She denies estrogen use.  She reports he does give her self Humira injections for Crohn's disease into her thighs and had a particularly painful injection the day before her symptoms began.  However she injects in the upper thigh and her pain is in her right calf.  DVT ultrasound performed today does show lower extremity venous thrombosis.  Patient will be started on Eliquis.  I reviewed her labs and creatinine from 1 month ago which has been normal.  She will need to follow-up with hematology for this unprovoked DVT.  She verbalized understanding.  I recommended heat, can try muscle relaxers for pain in her calf.  She will also continue with Tylenol.  She cannot take NSAIDs due to her Crohn's.  We discussed lifestyle adjustments given that she is on Eliquis, signs and symptoms of anemia and bleeding.   Wyvonnia Dusky, MD 07/01/22 1510

## 2022-07-01 NOTE — Progress Notes (Signed)
VASCULAR LAB    Right lower extremity venous duplex has been performed.  See CV proc for preliminary results.   Escarlet Saathoff, RVT 07/01/2022, 12:15 PM

## 2022-07-01 NOTE — Discharge Instructions (Addendum)
You are diagnosed with blood clot in your right lower leg on your ultrasound today.  You were started on Eliquis which is a blood thinner, to prevent further clots from forming or spreading.  You will need to follow-up with the blood specialist called hematologist.  Please call the number above to schedule follow-up appointment.  Being on a blood thinner can raise your risk of bleeding.  This could include spontaneous internal bleeding, bleeding in your stool, and also bleeding after trauma injuries, such as head injuries and car accidents.  If you begin having signs or symptoms of low blood count or anemia, including shortness of breath, lightheadedness, or worse with signs of severe headache, or if you experience head trauma, you need to come back to the ER immediately.  *  Your hematologist can continue managing your Eliquis prescription and blood thinners in the future.

## 2022-07-03 ENCOUNTER — Telehealth: Payer: Self-pay | Admitting: Gastroenterology

## 2022-07-03 NOTE — Telephone Encounter (Signed)
Prefer in office but if its difficult for [place], ok to schedule virtual visit. Thanks

## 2022-07-03 NOTE — Telephone Encounter (Signed)
Spoke with the patient through My Chart. Scheduled for in office appointment 07/11/22 at 11:20 am.

## 2022-07-03 NOTE — Telephone Encounter (Signed)
Inbound call from patient stating that she is scheduled to have a colonoscopy with Dr. Silverio Decamp tomorrow. Patient also state that over the weekend she was in the hospital and found out she has 2 blood clots in her leg and is seeking advice if she can still have procedure. Please advise.

## 2022-07-03 NOTE — Telephone Encounter (Signed)
She will need to be on uninterrupted anticoagulation for DVT. Will need to reschedule procedure, ideally should to wait 3 months. Beth, can you please schedule office follow up visit with me soon. Thanks

## 2022-07-03 NOTE — Telephone Encounter (Signed)
Called the patient back this morning. She was recently diagnosed with Acute DVt on 7/15 and started on Eliquis. She is scheduled for Colonoscopy tomorrow. Will make Dr. Silverio Decamp aware and wait for her advise about the procedure. Pt also had multiple questions regarding the Humira and possible DVT as a side affect.

## 2022-07-03 NOTE — Telephone Encounter (Signed)
Called the patient back and told the patient we would be postponing her procedure for 3 months ideally and that she would be contacted by the office for a follow up visit with Dr. Silverio Decamp soon.

## 2022-07-04 ENCOUNTER — Other Ambulatory Visit: Payer: Self-pay | Admitting: *Deleted

## 2022-07-04 ENCOUNTER — Telehealth: Payer: Self-pay | Admitting: Hematology and Oncology

## 2022-07-04 ENCOUNTER — Encounter: Payer: BC Managed Care – PPO | Admitting: Gastroenterology

## 2022-07-04 DIAGNOSIS — I82491 Acute embolism and thrombosis of other specified deep vein of right lower extremity: Secondary | ICD-10-CM

## 2022-07-04 NOTE — Telephone Encounter (Signed)
Scheduled appt per 7/18 referral. Pt is aware of appt date and time. Pt is aware to arrive 15 mins prior to appt time and to bring and updated insurance card. Pt is aware of appt location.

## 2022-07-06 NOTE — ED Provider Notes (Signed)
[facility] EMERGENCY DEPARTMENT Provider Note   CSN: [id number] Arrival date & time: 07/01/22  1203     History  Chief Complaint  Patient presents with   DVT    Gloria Green is a 31 y.o. female  Patient is presenting to the ED after returning for DVT ultrasound today.  She was seen last night for complaint of right calf pain that began a week and a half ago spontaneously.  She denies long periods of immobilization preceding that.  Denies history of clots or family history of clotting disorder.  She denies estrogen use.  She reports he does give her self Humira injections for Crohn's disease into her thighs and had a particularly painful injection the day before her symptoms began.  However she injects in the upper thigh and her pain is in her right calf.   DVT ultrasound performed today does show lower extremity venous thrombosis.  Patient will be started on Eliquis.  I reviewed her labs and creatinine from 1 month ago which has been normal.   She will need to follow-up with hematology for this unprovoked DVT.  She verbalized understanding.  I recommended heat, can try muscle relaxers for pain in her calf.  She will also continue with Tylenol.  She cannot take NSAIDs due to her Crohn's.   We discussed lifestyle adjustments given that she is on Eliquis, signs and symptoms of anemia and bleeding.  HPI     Home Medications Prior to Admission medications   Medication Sig Start Date End Date Taking? Authorizing Provider  APIXABAN (ELIQUIS) VTE STARTER PACK (10MG AND 5MG) Take as directed on package: start with two-64m tablets twice daily for 7 days. On day 8, switch to one-596mtablet twice daily. 07/01/22  Yes Monai Hindes, MaCarola RhineMD  cyclobenzaprine (FLEXERIL) 10 MG tablet Take 1 tablet (10 mg total) by mouth 2 (two) times daily as needed for up to 15 doses for muscle spasms. 07/01/22  Yes Arrionna Serena, MaCarola RhineMD  Adalimumab (HUMIRA PEN) 40 MG/0.4ML PNKT Inject 40 mg  into the skin once a week. 01/27/22   NaMauri PoleMD  cholecalciferol (VITAMIN D3) 25 MCG (1000 UNIT) tablet Take 1,000 Units by mouth daily.    [provider]  Multiple Vitamin (MULTIVITAMIN) tablet Take 4 tablets by mouth daily. Prenatal Vitamin    [provider]  ondansetron (ZOFRAN) 4 MG tablet Take 1 tablet (4 mg total) by mouth daily as needed for nausea or vomiting. 12/20/21   NaMauri PoleMD  promethazine (PHENERGAN) 12.5 MG tablet Take 1 tablet (12.5 mg total) by mouth daily as needed for nausea or vomiting. 10/17/21   NaMauri PoleMD      Allergies    Sulfa antibiotics, Latex, and Wound dressing adhesive    Review of Systems   Review of Systems  Physical Exam Updated Vital Signs BP 125/85 (BP Location: Right Arm)   Pulse 77   Temp 98.3 F (36.8 C) (Oral)   Resp 16   Ht 5' 1.5" (1.562 m)   Wt 52.2 kg   SpO2 100%   BMI 21.38 kg/m  Physical Exam Constitutional:      General: She is not in acute distress. HENT:     Head: Normocephalic and atraumatic.  Cardiovascular:     Rate and Rhythm: Normal rate and regular rhythm.  Skin:    General: Skin is warm and dry.  Neurological:     General: No focal deficit  present.     Mental Status: She is alert and oriented to person, place, and time. Mental status is at baseline.  Psychiatric:        Mood and Affect: Mood normal.        Behavior: Behavior normal.     ED Results / Procedures / Treatments   Labs (all labs ordered are listed, but only abnormal results are displayed) Labs Reviewed - No data to display  EKG None  Radiology No results found.  Procedures Procedures    Medications Ordered in ED Medications - No data to display  ED Course/ Medical Decision Making/ A&P                           Medical Decision Making Risk Prescription drug management.   Patient is presenting to the ED after returning for DVT ultrasound today.  She was seen last night for  complaint of right calf pain that began a week and a half ago spontaneously.  She denies long periods of immobilization preceding that.  Denies history of clots or family history of clotting disorder.  She denies estrogen use.  She reports he does give her self Humira injections for Crohn's disease into her thighs and had a particularly painful injection the day before her symptoms began.  However she injects in the upper thigh and her pain is in her right calf.   DVT ultrasound performed today does show lower extremity venous thrombosis.  Patient will be started on Eliquis.  I reviewed her labs and creatinine from 1 month ago which has been normal.   She will need to follow-up with hematology for this unprovoked DVT.  She verbalized understanding.  I recommended heat, can try muscle relaxers for pain in her calf.  She will also continue with Tylenol.  She cannot take NSAIDs due to her Crohn's.   We discussed lifestyle adjustments given that she is on Eliquis, signs and symptoms of anemia and bleeding.        Final Clinical Impression(s) / ED Diagnoses Final diagnoses:  Acute deep venous thrombosis (DVT) determined by ultrasound [facility])    Rx / DC Orders ED Discharge Orders          Ordered    cyclobenzaprine (FLEXERIL) 10 MG tablet  2 times daily PRN        07/01/22 1513    APIXABAN (ELIQUIS) VTE STARTER PACK (10MG AND 5MG)        07/01/22 1513              Wyvonnia Dusky, MD 07/06/22 1137

## 2022-07-11 ENCOUNTER — Encounter: Payer: Self-pay | Admitting: Gastroenterology

## 2022-07-11 ENCOUNTER — Other Ambulatory Visit (INDEPENDENT_AMBULATORY_CARE_PROVIDER_SITE_OTHER): Payer: BC Managed Care – PPO

## 2022-07-11 ENCOUNTER — Ambulatory Visit (INDEPENDENT_AMBULATORY_CARE_PROVIDER_SITE_OTHER): Payer: BC Managed Care – PPO | Admitting: Gastroenterology

## 2022-07-11 VITALS — BP 124/72 | HR 86 | Ht 62.0 in | Wt 118.0 lb

## 2022-07-11 DIAGNOSIS — K51011 Ulcerative (chronic) pancolitis with rectal bleeding: Secondary | ICD-10-CM | POA: Diagnosis not present

## 2022-07-11 DIAGNOSIS — R5383 Other fatigue: Secondary | ICD-10-CM | POA: Diagnosis not present

## 2022-07-11 DIAGNOSIS — D508 Other iron deficiency anemias: Secondary | ICD-10-CM | POA: Diagnosis not present

## 2022-07-11 DIAGNOSIS — K51918 Ulcerative colitis, unspecified with other complication: Secondary | ICD-10-CM

## 2022-07-11 LAB — CBC WITH DIFFERENTIAL/PLATELET
Basophils Absolute: 0 10*3/uL (ref 0.0–0.1)
Basophils Relative: 0.2 % (ref 0.0–3.0)
Eosinophils Absolute: 0 10*3/uL (ref 0.0–0.7)
Eosinophils Relative: 0.8 % (ref 0.0–5.0)
HCT: 37 % (ref 36.0–46.0)
Hemoglobin: 12.1 g/dL (ref 12.0–15.0)
Lymphocytes Relative: 40.4 % (ref 12.0–46.0)
Lymphs Abs: 2.4 10*3/uL (ref 0.7–4.0)
MCHC: 32.6 g/dL (ref 30.0–36.0)
MCV: 84.1 fl (ref 78.0–100.0)
Monocytes Absolute: 0.4 10*3/uL (ref 0.1–1.0)
Monocytes Relative: 6 % (ref 3.0–12.0)
Neutro Abs: 3.1 10*3/uL (ref 1.4–7.7)
Neutrophils Relative %: 52.6 % (ref 43.0–77.0)
Platelets: 259 10*3/uL (ref 150.0–400.0)
RBC: 4.4 Mil/uL (ref 3.87–5.11)
RDW: 14.7 % (ref 11.5–15.5)
WBC: 5.9 10*3/uL (ref 4.0–10.5)

## 2022-07-11 LAB — COMPREHENSIVE METABOLIC PANEL
ALT: 8 U/L (ref 0–35)
AST: 11 U/L (ref 0–37)
Albumin: 4.1 g/dL (ref 3.5–5.2)
Alkaline Phosphatase: 67 U/L (ref 39–117)
BUN: 15 mg/dL (ref 6–23)
CO2: 27 mEq/L (ref 19–32)
Calcium: 9.1 mg/dL (ref 8.4–10.5)
Chloride: 103 mEq/L (ref 96–112)
Creatinine, Ser: 0.61 mg/dL (ref 0.40–1.20)
GFR: 118.15 mL/min (ref >60.00)
Glucose, Bld: 81 mg/dL (ref 70–99)
Potassium: 4 mEq/L (ref 3.5–5.1)
Sodium: 136 mEq/L (ref 135–145)
Total Bilirubin: 0.7 mg/dL (ref 0.2–1.2)
Total Protein: 7.6 g/dL (ref 6.0–8.3)

## 2022-07-11 LAB — HIGH SENSITIVITY CRP: CRP, High Sensitivity: 22.24 mg/L — ABNORMAL HIGH (ref 0.000–5.000)

## 2022-07-11 LAB — C-REACTIVE PROTEIN: CRP: 2.2 mg/dL (ref 0.5–20.0)

## 2022-07-11 NOTE — Patient Instructions (Signed)
Your provider has requested that you go to the basement level for lab work before leaving today. Press "B" on the elevator. The lab is located at the first door on the left as you exit the elevator.  Please follow up with Dr. Silverio Decamp in 3 months.  The [facility] providers would like to encourage you to use [facility] to communicate with providers for non-urgent requests or questions.  Due to long hold times on the telephone, sending your provider a message by [facility] may be a faster and more efficient way to get a response.  Please allow 48 business hours for a response.  Please remember that this is for non-urgent requests.   Due to recent changes in healthcare laws, you may see the results of your imaging and laboratory studies on MyChart before your provider has had a chance to review them.  We understand that in some cases there may be results that are confusing or concerning to you. Not all laboratory results come back in the same time frame and the provider may be waiting for multiple results in order to interpret others.  Please give [place] 48 hours in order for your provider to thoroughly review all the results before contacting the office for clarification of your results.

## 2022-07-11 NOTE — Progress Notes (Signed)
Gloria Green    [id number]    08-Oct-1991  Primary Care Physician:Mitchell, L.Marlou Sa, MD  Referring Physician: Alroy Dust, Carlean Jews.Marlou Sa, [place],  [place] [address]   Chief complaint: Ulcerative colitis  HPI:  31 year old very pleasant female here for follow-up visit for ulcerative colitis   She recently developed DVT, is on anticoagulation with Eliquis.  Rescheduled colonoscopy to avoid interruption in anticoagulation   Is currently on weekly Humira.  Denies any rectal bleeding or diarrhea.  Overall GI symptoms are stable   She has noticed swelling side of her neck the past week, denies any tenderness.  Denies any upper respiratory infection symptoms or ear pain to suggest ear infection.  No sick contacts.    Flexible sigmoidoscopy January 14, 2022 - Severe inflammation from anus to the extent of the exam (mid sigmoid colon), characterized by ulceration, pseudopolyps, very friable, congested mucosa. This was biopsied for histology and also for viral culture. - Small internal hemorrhoids.   EGD 01/14/22 - No fresh or old blood in the UGI tract. - Retained solid food in the stomach without anatomic outlet obstruction, suggesting a component of slow gastric emptying.   CT chest abdomen and pelvis with contrast January 13, 2022 1. Diffuse colonic wall thickening consistent with ulcerative colitis. 2. Borderline splenomegaly. 3. 4 mm left upper lobe pulmonary nodule. No follow-up needed if patient is low-risk. Non-contrast chest CT can be considered in 12 months if patient is high-risk. This recommendation follows the consensus statement: Guidelines for Management of Incidental Pulmonary Nodules Detected on CT Images: From the Fleischner Society 2017; Radiology 2017; 284:228-243. 4. Nonobstructing less than 3 mm right renal calculi. 5. Stable right adrenal adenoma.       Outpatient Encounter Medications as of 07/11/2022  Medication  Sig   Adalimumab (HUMIRA PEN) 40 MG/0.4ML PNKT Inject 40 mg into the skin once a week.   APIXABAN (ELIQUIS) VTE STARTER PACK (10MG AND 5MG) Take as directed on package: start with two-36m tablets twice daily for 7 days. On day 8, switch to one-543mtablet twice daily.   cholecalciferol (VITAMIN D3) 25 MCG (1000 UNIT) tablet Take 1,000 Units by mouth daily. (Patient not taking: Reported on 07/11/2022)   cyclobenzaprine (FLEXERIL) 10 MG tablet Take 1 tablet (10 mg total) by mouth 2 (two) times daily as needed for up to 15 doses for muscle spasms. (Patient not taking: Reported on 07/11/2022)   Multiple Vitamin (MULTIVITAMIN) tablet Take 4 tablets by mouth daily. Prenatal Vitamin (Patient not taking: Reported on 07/11/2022)   ondansetron (ZOFRAN) 4 MG tablet Take 1 tablet (4 mg total) by mouth daily as needed for nausea or vomiting. (Patient not taking: Reported on 07/11/2022)   promethazine (PHENERGAN) 12.5 MG tablet Take 1 tablet (12.5 mg total) by mouth daily as needed for nausea or vomiting. (Patient not taking: Reported on 07/11/2022)   No facility-administered encounter medications on file as of 07/11/2022.    Allergies as of 07/11/2022 - Review Complete 07/11/2022  Allergen Reaction Noted   Sulfa antibiotics Hives 04/02/2013   Latex Rash 06/09/2019   Wound dressing adhesive Rash 10/16/2021    Past Medical History:  Diagnosis Date   Anal fissure    Anemia    Anxiety    DVT (deep venous thrombosis) (HCC)    Hemorrhoid    [facility]    Past Surgical History:  Procedure Laterality Date   BIOPSY  01/14/2022  Procedure: BIOPSY;  Surgeon: Milus Banister, MD;  Location: [facility];  Service: Endoscopy;;   CESAREAN SECTION  09/11/2021   COSMETIC SURGERY  12/18/2010   nose   ESOPHAGOGASTRODUODENOSCOPY (EGD) WITH PROPOFOL N/A 01/14/2022   Procedure: ESOPHAGOGASTRODUODENOSCOPY (EGD) WITH PROPOFOL;  Surgeon: Milus Banister, MD;  Location: [facility];  Service: Endoscopy;   Laterality: N/A;   FLEXIBLE SIGMOIDOSCOPY N/A 01/14/2022   Procedure: FLEXIBLE SIGMOIDOSCOPY;  Surgeon: Milus Banister, MD;  Location: [facility];  Service: Endoscopy;  Laterality: N/A;   MYOMECTOMY N/A 06/16/2019   Procedure: ABDOMINAL MYOMECTOMY;  Surgeon: Molli Posey, MD;  Location: [facility];  Service: Gynecology;  Laterality: N/A;   WISDOM TOOTH EXTRACTION      Family History  Problem Relation Age of Onset   Hypertension Mother    Obesity Father    Hypertension Brother    Lung cancer Maternal Grandmother    Lung cancer Maternal Grandfather    Colon cancer Neg Hx    Esophageal cancer Neg Hx    Stomach cancer Neg Hx     Social History   Socioeconomic History   Marital status: Married    Spouse name: Not on file   Number of children: 2   Years of education: Not on file   Highest education level: Not on file  Occupational History   Occupation: Teacher  Tobacco Use   Smoking status: Former    Types: Cigarettes    Quit date: 06/11/2017    Years since quitting: 5.0   Smokeless tobacco: Never  Vaping Use   Vaping Use: Former   Quit date: 10/11/2018  Substance and Sexual Activity   Alcohol use: Not Currently    Comment: occ   Drug use: Never   Sexual activity: Yes  Other Topics Concern   Not on file  Social History Narrative   Not on file   Social Determinants of Health   Financial Resource Strain: Not on file  Food Insecurity: Not on file  Transportation Needs: Not on file  Physical Activity: Not on file  Stress: Not on file  Social Connections: Not on file  Intimate Partner Violence: Not on file      Review of systems: All other review of systems negative except as mentioned in the HPI.   Physical Exam: Vitals:   07/11/22 1118  BP: 124/72  Pulse: 86  SpO2: 100%   Body mass index is 21.58 kg/m. Gen:      No acute distress HEENT:  sclera anicteric Abd:      soft, non-tender; no palpable masses, no distension Ext:    No  edema Neuro: alert and oriented x 3 Psych: normal mood and affect  Data Reviewed:  Reviewed labs, radiology imaging, old records and pertinent past GI work up   Assessment and Plan/Recommendations:  31 year old very pleasant female with severe ulcerative pancolitis, hospitalization February 2023 with acute flare Symptoms overall improving on weekly Humira.  CRP and sed rate remain elevated, persistent elevation likely secondary to acute DVT  Follow-up fecal calprotectin Continue current regimen with Humira injection weekly  Palpable cervical lymph node secondary to acute infection.  Advised patient to monitor. Follow-up TB QuantiFERON gold IBD health maintenance: Follow-up CBC and CMP.  Do not have hepatitis A,B and C status in epic results, will check   If calprotectin is significantly elevated, will plan to switch therapy to either Rinvoq or Skyrizi to lack of response to anti-TNF based on insurance approval.  IBD health maintenance: She is due for colonoscopy to check disease activity and extent of colitis, will defer it for 3 to 6 months to allow uninterrupted anticoagulation in the setting of acute DVT   Return in 2-3 months     The patient was provided an opportunity to ask questions and all were answered. The patient agreed with the plan and demonstrated an understanding of the instructions.  Damaris Hippo , MD    CC: Alroy Dust, L.Marlou Sa, MD

## 2022-07-12 LAB — HEPATITIS A ANTIBODY, TOTAL: Hepatitis A AB,Total: NONREACTIVE

## 2022-07-12 LAB — HEPATITIS C ANTIBODY: Hepatitis C Ab: NONREACTIVE

## 2022-07-12 LAB — HEPATITIS B SURFACE ANTIGEN: Hepatitis B Surface Ag: NONREACTIVE

## 2022-07-12 LAB — HEPATITIS B SURFACE ANTIBODY,QUALITATIVE: Hep B S Ab: NONREACTIVE

## 2022-07-14 ENCOUNTER — Other Ambulatory Visit: Payer: Self-pay

## 2022-07-14 MED ORDER — HUMIRA (2 PEN) 40 MG/0.4ML ~~LOC~~ AJKT
40.0000 mg | AUTO-INJECTOR | SUBCUTANEOUS | 11 refills | Status: DC
Start: 2022-07-14 — End: 2022-07-25

## 2022-07-15 LAB — QUANTIFERON-TB GOLD PLUS
Mitogen-NIL: >10 IU/mL
NIL: 0.15 IU/mL
QuantiFERON-TB Gold Plus: NEGATIVE
TB1-NIL: <0 IU/mL
TB2-NIL: <0 IU/mL

## 2022-07-17 ENCOUNTER — Encounter: Payer: Self-pay | Admitting: Gastroenterology

## 2022-07-18 ENCOUNTER — Other Ambulatory Visit: Payer: BC Managed Care – PPO

## 2022-07-18 DIAGNOSIS — K51918 Ulcerative colitis, unspecified with other complication: Secondary | ICD-10-CM

## 2022-07-20 LAB — CALPROTECTIN, FECAL: Calprotectin, Fecal: 55 ug/g (ref 0–120)

## 2022-07-24 ENCOUNTER — Inpatient Hospital Stay: Payer: BC Managed Care – PPO

## 2022-07-24 ENCOUNTER — Other Ambulatory Visit: Payer: Self-pay | Admitting: Gastroenterology

## 2022-07-24 ENCOUNTER — Inpatient Hospital Stay: Payer: BC Managed Care – PPO | Attending: Hematology and Oncology | Admitting: Hematology and Oncology

## 2022-07-24 ENCOUNTER — Other Ambulatory Visit: Payer: Self-pay

## 2022-07-24 DIAGNOSIS — K5190 Ulcerative colitis, unspecified, without complications: Secondary | ICD-10-CM | POA: Diagnosis not present

## 2022-07-24 DIAGNOSIS — I824Z1 Acute embolism and thrombosis of unspecified deep veins of right distal lower extremity: Secondary | ICD-10-CM

## 2022-07-24 DIAGNOSIS — I824Z2 Acute embolism and thrombosis of unspecified deep veins of left distal lower extremity: Secondary | ICD-10-CM | POA: Insufficient documentation

## 2022-07-24 DIAGNOSIS — Z87891 Personal history of nicotine dependence: Secondary | ICD-10-CM | POA: Insufficient documentation

## 2022-07-24 DIAGNOSIS — I82409 Acute embolism and thrombosis of unspecified deep veins of unspecified lower extremity: Secondary | ICD-10-CM | POA: Insufficient documentation

## 2022-07-24 DIAGNOSIS — Z7901 Long term (current) use of anticoagulants: Secondary | ICD-10-CM | POA: Diagnosis not present

## 2022-07-24 DIAGNOSIS — I82441 Acute embolism and thrombosis of right tibial vein: Secondary | ICD-10-CM | POA: Insufficient documentation

## 2022-07-24 DIAGNOSIS — Z79899 Other long term (current) drug therapy: Secondary | ICD-10-CM | POA: Insufficient documentation

## 2022-07-24 LAB — CBC WITH DIFFERENTIAL (CANCER CENTER ONLY)
Abs Immature Granulocytes: 0.02 10*3/uL (ref 0.00–0.07)
Basophils Absolute: 0 10*3/uL (ref 0.0–0.1)
Basophils Relative: 0 %
Eosinophils Absolute: 0.1 10*3/uL (ref 0.0–0.5)
Eosinophils Relative: 1 %
HCT: 37.6 % (ref 36.0–46.0)
Hemoglobin: 12.4 g/dL (ref 12.0–15.0)
Immature Granulocytes: 0 %
Lymphocytes Relative: 39 %
Lymphs Abs: 2.9 10*3/uL (ref 0.7–4.0)
MCH: 27.8 pg (ref 26.0–34.0)
MCHC: 33 g/dL (ref 30.0–36.0)
MCV: 84.3 fL (ref 80.0–100.0)
Monocytes Absolute: 0.5 10*3/uL (ref 0.1–1.0)
Monocytes Relative: 7 %
Neutro Abs: 3.9 10*3/uL (ref 1.7–7.7)
Neutrophils Relative %: 53 %
Platelet Count: 278 10*3/uL (ref 150–400)
RBC: 4.46 MIL/uL (ref 3.87–5.11)
RDW: 13.5 % (ref 11.5–15.5)
WBC Count: 7.4 10*3/uL (ref 4.0–10.5)
nRBC: 0 % (ref 0.0–0.2)

## 2022-07-24 MED ORDER — APIXABAN 5 MG PO TABS: 5.0000 mg | ORAL_TABLET | Freq: Two times a day (BID) | ORAL | 5 refills | Status: DC

## 2022-07-24 NOTE — Assessment & Plan Note (Signed)
Calf pain: Led to ultrasound 07/02/2022: Acute DVT involving posterior tibial vein, anterior tibial vein Distal vein DVT:  Current treatment: Eliquis started 07/02/2022 Eliquis toxicities:  Given the young age of onset of unprovoked DVT, recommend hypercoagulability risk panel. Telephone visit in 1 week to discuss the results.

## 2022-07-24 NOTE — Progress Notes (Signed)
[facility] NOTE  Patient Care Team: Nicholas Lose, MD as PCP - General (Hematology and Oncology)  CHIEF COMPLAINTS/PURPOSE OF CONSULTATION:  New diagnosis of right leg DVT   HISTORY OF PRESENTING ILLNESS:  Gloria Green 31 y.o. female is here because of recent diagnosis of right leg posterior tibial and anterior tibial vein DVTs.  Recently she went to the beach for vacation and started having calf pain.  It got worse significantly over the next couple of days and that she had to come back sooner from her beach vacation and went to the urgent care and she was found to have a blood clot in her right leg.  She was started on Eliquis and was referred to [place] for discussion regarding the sudden onset of blood clot.  She had no predisposing issues or risk factors.  She was a previous smoker quit 10 years ago she however still chews on nicotine gum.  I reviewed her records extensively and collaborated the history with the patient.  MEDICAL HISTORY:  Past Medical History:  Diagnosis Date   Anal fissure    Anemia    Anxiety    DVT (deep venous thrombosis) (HCC)    Hemorrhoid    Ulcerative colitis ([place])     SURGICAL HISTORY: Past Surgical History:  Procedure Laterality Date   BIOPSY  01/14/2022   Procedure: BIOPSY;  Surgeon: Milus Banister, MD;  Location: [facility];  Service: Endoscopy;;   CESAREAN SECTION  09/11/2021   COSMETIC SURGERY  12/18/2010   nose   ESOPHAGOGASTRODUODENOSCOPY (EGD) WITH PROPOFOL N/A 01/14/2022   Procedure: ESOPHAGOGASTRODUODENOSCOPY (EGD) WITH PROPOFOL;  Surgeon: Milus Banister, MD;  Location: [facility];  Service: Endoscopy;  Laterality: N/A;   FLEXIBLE SIGMOIDOSCOPY N/A 01/14/2022   Procedure: FLEXIBLE SIGMOIDOSCOPY;  Surgeon: Milus Banister, MD;  Location: [facility];  Service: Endoscopy;  Laterality: N/A;   MYOMECTOMY N/A 06/16/2019   Procedure: ABDOMINAL MYOMECTOMY;  Surgeon: Molli Posey, MD;  Location: [facility];  Service: Gynecology;  Laterality: N/A;   WISDOM TOOTH EXTRACTION      SOCIAL HISTORY: Social History   Socioeconomic History   Marital status: Married    Spouse name: Not on file   Number of children: 2   Years of education: Not on file   Highest education level: Not on file  Occupational History   Occupation: Teacher  Tobacco Use   Smoking status: Former    Types: Cigarettes    Quit date: 06/11/2017    Years since quitting: 5.1   Smokeless tobacco: Never  Vaping Use   Vaping Use: Former   Quit date: 10/11/2018  Substance and Sexual Activity   Alcohol use: Not Currently    Comment: occ   Drug use: Never   Sexual activity: Yes  Other Topics Concern   Not on file  Social History Narrative   Not on file   Social Determinants of Health   Financial Resource Strain: Not on file  Food Insecurity: Not on file  Transportation Needs: Not on file  Physical Activity: Not on file  Stress: Not on file  Social Connections: Not on file  Intimate Partner Violence: Not on file    FAMILY HISTORY: Family History  Problem Relation Age of Onset   Hypertension Mother    Obesity Father    Hypertension Brother    Lung cancer Maternal Grandmother    Lung cancer Maternal Grandfather    Colon cancer Neg Hx  Esophageal cancer Neg Hx    Stomach cancer Neg Hx     ALLERGIES:  is allergic to sulfa antibiotics, latex, and wound dressing adhesive.  MEDICATIONS:  Current Outpatient Medications  Medication Sig Dispense Refill   apixaban (ELIQUIS) 5 MG TABS tablet Take 1 tablet (5 mg total) by mouth 2 (two) times daily. 60 tablet 5   cholecalciferol (VITAMIN D3) 25 MCG (1000 UNIT) tablet Take 1,000 Units by mouth daily. (Patient not taking: Reported on 07/11/2022)     cyclobenzaprine (FLEXERIL) 10 MG tablet Take 1 tablet (10 mg total) by mouth 2 (two) times daily as needed for up to 15 doses for muscle spasms. (Patient not taking: Reported on 07/11/2022) 15 tablet 0    HUMIRA PEN 40 MG/0.4ML PNKT Inject 40 mg into the skin once a week. Dx K51.011    Ulcerative Colitis 4 each 11   Multiple Vitamin (MULTIVITAMIN) tablet Take 4 tablets by mouth daily. Prenatal Vitamin (Patient not taking: Reported on 07/11/2022)     ondansetron (ZOFRAN) 4 MG tablet Take 1 tablet (4 mg total) by mouth daily as needed for nausea or vomiting. (Patient not taking: Reported on 07/11/2022) 30 tablet 0   promethazine (PHENERGAN) 12.5 MG tablet Take 1 tablet (12.5 mg total) by mouth daily as needed for nausea or vomiting. (Patient not taking: Reported on 07/11/2022) 30 tablet 0   No current facility-administered medications for this visit.    REVIEW OF SYSTEMS:   Constitutional: Denies fevers, chills or abnormal night sweats   All other systems were reviewed with the patient and are negative.  PHYSICAL EXAMINATION: ECOG PERFORMANCE STATUS: 1 - Symptomatic but completely ambulatory  Vitals:   07/24/22 1549  BP: 121/74  Pulse: 94  Resp: 17  Temp: 98.1 F (36.7 C)  SpO2: 100%   Filed Weights   07/24/22 1549  Weight: 118 lb 11.2 oz (53.8 kg)    GENERAL:alert, no distress and comfortable    LABORATORY DATA:  I have reviewed the data as listed Lab Results  Component Value Date   WBC 7.4 07/24/2022   HGB 12.4 07/24/2022   HCT 37.6 07/24/2022   MCV 84.3 07/24/2022   PLT 278 07/24/2022   Lab Results  Component Value Date   NA 136 07/11/2022   K 4.0 07/11/2022   CL 103 07/11/2022   CO2 27 07/11/2022      ASSESSMENT AND PLAN:  DVT (deep venous thrombosis) (HCC) Right calf pain: Led to ultrasound 07/02/2022: Acute DVT involving posterior tibial vein, anterior tibial vein Distal vein DVT History of ulcerative colitis on Humira  Risk factors: None (chews nicotine gum)  Current treatment: Eliquis started 07/02/2022 Eliquis toxicities: None  I discussed with the patient extensively about hypercoagulable risk factors including inherited and acquired risk factors.  We  will perform extensive blood work to evaluate for these risk factors.  She does not take birth control pills.  She has not been smoking although she does chew on nicotine gum.  She has not been nonambulatory or sedentary and does not have any foreign bodies in her body.  There is no clear risk factors identified by her history.  There is no family history of blood clots either.  She had 2 children complicated by ulcerative colitis.  Given the young age of onset of unprovoked DVT, recommend hypercoagulability risk panel. Telephone visit in 10 days to discuss the results.   All questions were answered. The patient knows to call the clinic with any problems, questions or  concerns.    Harriette Ohara, MD 07/24/22

## 2022-07-25 ENCOUNTER — Telehealth: Payer: Self-pay | Admitting: Hematology and Oncology

## 2022-07-25 LAB — BETA-2-GLYCOPROTEIN I ABS, IGG/M/A
Beta-2 Glyco I IgG: <9 GPI IgG units (ref 0–20)
Beta-2-Glycoprotein I IgA: <9 GPI IgA units (ref 0–25)
Beta-2-Glycoprotein I IgM: <9 GPI IgM units (ref 0–32)

## 2022-07-25 NOTE — Telephone Encounter (Signed)
Contacted patient to scheduled appointments. Left message with appointment details and a call back number if patient had any questions or could not accommodate the time we provided.   

## 2022-07-26 LAB — PROTEIN S, TOTAL: Protein S Ag, Total: 81 % (ref 60–150)

## 2022-07-26 LAB — LUPUS ANTICOAGULANT PANEL
DRVVT: 56.9 s — ABNORMAL HIGH (ref 0.0–47.0)
PTT Lupus Anticoagulant: 38.2 s (ref 0.0–43.5)

## 2022-07-26 LAB — CARDIOLIPIN ANTIBODIES, IGG, IGM, IGA
Anticardiolipin IgA: <9 APL U/mL (ref 0–11)
Anticardiolipin IgG: 10 GPL U/mL (ref 0–14)
Anticardiolipin IgM: 26 MPL U/mL — ABNORMAL HIGH (ref 0–12)

## 2022-07-26 LAB — ANTITHROMBIN III ANTIGEN: AT III AG PPP IMM-ACNC: 107 % (ref 72–124)

## 2022-07-26 LAB — DRVVT CONFIRM: dRVVT Confirm: 1.1 ratio (ref 0.8–1.2)

## 2022-07-26 LAB — DRVVT MIX: dRVVT Mix: 46.1 s — ABNORMAL HIGH (ref 0.0–40.4)

## 2022-07-27 LAB — PROTEIN C, TOTAL: Protein C, Total: 108 % (ref 60–150)

## 2022-07-27 LAB — FACTOR 5 LEIDEN

## 2022-07-30 ENCOUNTER — Emergency Department (HOSPITAL_COMMUNITY): Payer: BC Managed Care – PPO

## 2022-07-30 ENCOUNTER — Emergency Department (HOSPITAL_COMMUNITY)
Admission: EM | Admit: 2022-07-30 | Discharge: 2022-07-30 | Disposition: A | Discharge status: Home / Self Care | Payer: BC Managed Care – PPO | Attending: Emergency Medicine | Admitting: Emergency Medicine

## 2022-07-30 ENCOUNTER — Encounter (HOSPITAL_COMMUNITY): Payer: Self-pay

## 2022-07-30 ENCOUNTER — Other Ambulatory Visit: Payer: Self-pay

## 2022-07-30 DIAGNOSIS — Z91040 Latex allergy status: Secondary | ICD-10-CM | POA: Diagnosis not present

## 2022-07-30 DIAGNOSIS — Z7901 Long term (current) use of anticoagulants: Secondary | ICD-10-CM | POA: Diagnosis not present

## 2022-07-30 DIAGNOSIS — M79671 Pain in right foot: Secondary | ICD-10-CM | POA: Diagnosis not present

## 2022-07-30 LAB — BASIC METABOLIC PANEL
Anion gap: 6 (ref 5–15)
BUN: 25 mg/dL — ABNORMAL HIGH (ref 6–20)
CO2: 26 mmol/L (ref 22–32)
Calcium: 8.9 mg/dL (ref 8.9–10.3)
Chloride: 107 mmol/L (ref 98–111)
Creatinine, Ser: 0.59 mg/dL (ref 0.44–1.00)
GFR, Estimated: >60 mL/min (ref >60)
Glucose, Bld: 104 mg/dL — ABNORMAL HIGH (ref 70–99)
Potassium: 4.2 mmol/L (ref 3.5–5.1)
Sodium: 139 mmol/L (ref 135–145)

## 2022-07-30 LAB — CBC WITH DIFFERENTIAL/PLATELET
Abs Immature Granulocytes: 0.02 10*3/uL (ref 0.00–0.07)
Basophils Absolute: 0 10*3/uL (ref 0.0–0.1)
Basophils Relative: 0 %
Eosinophils Absolute: 0.1 10*3/uL (ref 0.0–0.5)
Eosinophils Relative: 2 %
HCT: 35 % — ABNORMAL LOW (ref 36.0–46.0)
Hemoglobin: 11.3 g/dL — ABNORMAL LOW (ref 12.0–15.0)
Immature Granulocytes: 0 %
Lymphocytes Relative: 41 %
Lymphs Abs: 3.1 10*3/uL (ref 0.7–4.0)
MCH: 28.5 pg (ref 26.0–34.0)
MCHC: 32.3 g/dL (ref 30.0–36.0)
MCV: 88.2 fL (ref 80.0–100.0)
Monocytes Absolute: 0.5 10*3/uL (ref 0.1–1.0)
Monocytes Relative: 7 %
Neutro Abs: 3.7 10*3/uL (ref 1.7–7.7)
Neutrophils Relative %: 50 %
Platelets: 242 10*3/uL (ref 150–400)
RBC: 3.97 MIL/uL (ref 3.87–5.11)
RDW: 13.5 % (ref 11.5–15.5)
WBC: 7.4 10*3/uL (ref 4.0–10.5)
nRBC: 0 % (ref 0.0–0.2)

## 2022-07-30 MED ORDER — CEPHALEXIN 500 MG PO CAPS: 500.0000 mg | ORAL_CAPSULE | Freq: Two times a day (BID) | ORAL | 0 refills | Status: DC

## 2022-07-30 MED ORDER — KETOROLAC TROMETHAMINE 15 MG/ML IJ SOLN
15.0000 mg | Freq: Once | INTRAMUSCULAR | Status: AC
  Administered 2022-07-30: 15 mg via INTRAVENOUS
  Filled 2022-07-30: qty 1

## 2022-07-30 NOTE — ED Triage Notes (Addendum)
Dx of Dvt ~1 month ago and started on Eliquis.   About 1 week ago right foot began swelling.

## 2022-07-30 NOTE — ED Provider Notes (Signed)
Gloria Green Provider Note   CSN: [id number] Arrival date & time: 07/30/22  0036     History  No chief complaint on file.   Gloria Green is a 31 y.o. female.  The history is provided by the patient.  Gloria Green is a 31 y.o. female who presents to the Emergency Department complaining of foot pain.  She was diagnosed with DVT 1 month ago and has been compliant with her Eliquis.  She is currently undergoing an outpatient hypercoagulable work-up.  She does have a history of ulcerative colitis and takes Humira.  5 to 6 days ago she developed pain in the right lateral foot and it is red and swollen.  It continues to increase in size.  No reports of injuries.  She is also started feeling there is a similar sensation to the left fifth toe.  She has pain on walking.  No fever.  No chest pain, shortness of breath, nausea, vomiting, night sweats.  No prior similar symptoms.   Home Medications Prior to Admission medications   Medication Sig Start Date End Date Taking? Authorizing Provider  cephALEXin (KEFLEX) 500 MG capsule Take 1 capsule (500 mg total) by mouth 2 (two) times daily. 07/30/22  Yes Quintella Reichert, MD  apixaban (ELIQUIS) 5 MG TABS tablet Take 1 tablet (5 mg total) by mouth 2 (two) times daily. 07/24/22   Nicholas Lose, MD  cholecalciferol (VITAMIN D3) 25 MCG (1000 UNIT) tablet Take 1,000 Units by mouth daily. Patient not taking: Reported on 07/11/2022    [provider]  cyclobenzaprine (FLEXERIL) 10 MG tablet Take 1 tablet (10 mg total) by mouth 2 (two) times daily as needed for up to 15 doses for muscle spasms. Patient not taking: Reported on 07/11/2022 07/01/22   Wyvonnia Dusky, MD  HUMIRA PEN 40 MG/0.4ML PNKT INJECT 1 PEN UNDER THE SKIN EVERY 7 DAYS. Patient taking differently: Inject 1 Pen into the skin once a week. 07/25/22   Mauri Pole, MD  Multiple Vitamin (MULTIVITAMIN) tablet Take 4 tablets by mouth daily. Prenatal  Vitamin Patient not taking: Reported on 07/11/2022    [provider]  ondansetron (ZOFRAN) 4 MG tablet Take 1 tablet (4 mg total) by mouth daily as needed for nausea or vomiting. Patient not taking: Reported on 07/11/2022 12/20/21   Mauri Pole, MD  promethazine (PHENERGAN) 12.5 MG tablet Take 1 tablet (12.5 mg total) by mouth daily as needed for nausea or vomiting. Patient not taking: Reported on 07/11/2022 10/17/21   Mauri Pole, MD      Allergies    Sulfa antibiotics, Latex, and Wound dressing adhesive    Review of Systems   Review of Systems  All other systems reviewed and are negative.   Physical Exam Updated Vital Signs BP 112/66   Pulse 81   Temp 98 F (36.7 C)   Resp 20   Ht 5' 2"  (1.575 m)   Wt 52.6 kg   SpO2 100%   BMI 21.22 kg/m  Physical Exam Vitals and nursing note reviewed.  Constitutional:      Appearance: She is well-developed.  HENT:     Head: Normocephalic and atraumatic.  Cardiovascular:     Rate and Rhythm: Normal rate and regular rhythm.     Heart sounds: No murmur heard. Pulmonary:     Effort: Pulmonary effort is normal. No respiratory distress.     Breath sounds: Normal breath sounds.  Abdominal:  Palpations: Abdomen is soft.     Tenderness: There is no abdominal tenderness. There is no guarding or rebound.  Musculoskeletal:     Comments: 2+ DP pulses bilaterally.  There is erythema and mild soft tissue swelling to the right lateral mid and distal foot with local tenderness to palpation.  There is mild erythema and tenderness to the left fifth digit.    Skin:    General: Skin is warm and dry.  Neurological:     Mental Status: She is alert and oriented to person, place, and time.  Psychiatric:        Behavior: Behavior normal.     ED Results / Procedures / Treatments   Labs (all labs ordered are listed, but only abnormal results are displayed) Labs Reviewed  BASIC METABOLIC PANEL - Abnormal; Notable for the  following components:      Result Value   Glucose, Bld 104 (*)    BUN 25 (*)    All other components within normal limits  CBC WITH DIFFERENTIAL/PLATELET - Abnormal; Notable for the following components:   Hemoglobin 11.3 (*)    HCT 35.0 (*)    All other components within normal limits    EKG None  Radiology DG Foot Complete Right  Result Date: 07/30/2022 CLINICAL DATA:  Right foot pain. EXAM: RIGHT FOOT COMPLETE - 3+ VIEW COMPARISON:  None Available. FINDINGS: There is no evidence of fracture or dislocation. There is no evidence of arthropathy or other focal bone abnormality. Soft tissues are unremarkable. IMPRESSION: Negative. Electronically Signed   By: Virgina Norfolk M.D.   On: 07/30/2022 04:03    Procedures Procedures    Medications Ordered in ED Medications  ketorolac (TORADOL) 15 MG/ML injection 15 mg (15 mg Intravenous Given 07/30/22 0359)    ED Course/ Medical Decision Making/ A&P                           Medical Decision Making Amount and/or Complexity of Data Reviewed Labs: ordered. Radiology: ordered.  Risk Prescription drug management.   Patient with history of DVT, ulcerative colitis here for evaluation of atraumatic right foot pain.  She does have redness and swelling with significant tenderness to palpation to the lateral aspect of the right foot.  She has patent distal pulses.  Current clinical picture is not consistent with recurrent DVT.  Images are negative for acute fracture.  Patient with mild anemia.  Normal CBC.  Doubt acute septic arthritis.  She was treated with Toradol with improvement in her symptoms.  Will start empiric antibiotics for possible infectious process.  Discussed unclear source of symptoms.  Discussed importance of close outpatient follow-up.        Final Clinical Impression(s) / ED Diagnoses Final diagnoses:  Right foot pain    Rx / DC Orders ED Discharge Orders          Ordered    cephALEXin (KEFLEX) 500 MG capsule   2 times daily        07/30/22 0450              Quintella Reichert, MD 07/30/22 [phone]

## 2022-08-01 LAB — PROTHROMBIN GENE MUTATION

## 2022-08-03 NOTE — Progress Notes (Signed)
HEMATOLOGY-ONCOLOGY TELEPHONE VISIT PROGRESS NOTE  I connected with our patient on 08/04/22 at  8:45 AM EDT by telephone and verified that I am speaking with the correct person using two identifiers.  I discussed the limitations, risks, security and privacy concerns of performing an evaluation and management service by telephone and the availability of in person appointments.  I also discussed with the patient that there may be a patient responsible charge related to this service. The patient expressed understanding and agreed to proceed.   History of Present Illness: Gloria Green 31 y.o. female is here because of recent diagnosis of right leg posterior tibial and anterior tibial vein DVTs. She presents to the clinic today for a telephone follow-up.  REVIEW OF SYSTEMS:   Constitutional: Denies fevers, chills or abnormal weight loss All other systems were reviewed with the patient and are negative. Observations/Objective:     Assessment Plan:  DVT (deep venous thrombosis) (HCC) Right calf pain: Led to ultrasound 07/02/2022: Acute DVT involving posterior tibial vein, anterior tibial vein Distal vein DVT History of ulcerative colitis on Humira   Risk factors: None (chews nicotine gum)   Current treatment: Eliquis started 07/02/2022 Eliquis toxicities: None  Hypercoagulability panel: 1.  Beta-2 glycoprotein antibodies: Negative 2. anticardiolipin IgM low positive: 26 3.  Antithrombin III: 107% 4.  Protein S antigen: 81%, protein C antigen: 108% 5.  Prothrombin gene mutation: Not detected, factor V Leiden: Not detected 6.  Lupus anticoagulant: Negative  Discussion: Hypercoagulability has come back negative for any inherited risk factors as well as negative for the antiphospholipid antibody.  Recommendation: 6 months of anticoagulation. At the end of 6 months we will obtain ultrasound of the leg and D-dimer.  If both are negative then she can discontinue anticoagulation at that  time.    I discussed the assessment and treatment plan with the patient. The patient was provided an opportunity to ask questions and all were answered. The patient agreed with the plan and demonstrated an understanding of the instructions. The patient was advised to call back or seek an in-person evaluation if the symptoms worsen or if the condition fails to improve as anticipated.   I provided 12 minutes of non-face-to-face time during this encounter.  This includes time for charting and coordination of care   Harriette Ohara, MD   I Gardiner Coins am scribing for Dr. Lindi Adie  I have reviewed the above documentation for accuracy and completeness, and I agree with the above.

## 2022-08-04 ENCOUNTER — Inpatient Hospital Stay (HOSPITAL_BASED_OUTPATIENT_CLINIC_OR_DEPARTMENT_OTHER): Payer: BC Managed Care – PPO | Admitting: Hematology and Oncology

## 2022-08-04 DIAGNOSIS — I824Z1 Acute embolism and thrombosis of unspecified deep veins of right distal lower extremity: Secondary | ICD-10-CM

## 2022-08-04 NOTE — Assessment & Plan Note (Signed)
Right calf pain: Led to ultrasound 07/02/2022: Acute DVT involving posterior tibial vein, anterior tibial vein Distal vein DVT History of ulcerative colitis on Humira  Risk factors: None (chews nicotine gum)  Current treatment: Eliquis started 07/02/2022 Eliquis toxicities: None  Hypercoagulability panel: 1.  Beta-2 glycoprotein antibodies: Negative 2. anticardiolipin IgM low positive: 26 3.  Antithrombin III: 107% 4.  Protein S antigen: 81%, protein C antigen: 108% 5.  Prothrombin gene mutation: Not detected, factor V Leiden: Not detected 6.  Lupus anticoagulant: Negative  Discussion: Hypercoagulability has come back negative for any inherited risk factors as well as negative for the antiphospholipid antibody.  Recommendation: 6 months of anticoagulation. At the end of 6 months we will obtain ultrasound of the leg and D-dimer.  If both are negative then she can discontinue anticoagulation at that time.

## 2022-08-07 ENCOUNTER — Telehealth: Payer: Self-pay | Admitting: Hematology and Oncology

## 2022-08-07 NOTE — Telephone Encounter (Signed)
Scheduled appointment per 8/18 los. Patient is aware.

## 2022-09-04 ENCOUNTER — Other Ambulatory Visit: Payer: Self-pay | Admitting: Gastroenterology

## 2022-10-25 ENCOUNTER — Other Ambulatory Visit (HOSPITAL_COMMUNITY): Payer: Self-pay

## 2022-10-26 ENCOUNTER — Telehealth: Payer: Self-pay

## 2022-10-26 ENCOUNTER — Other Ambulatory Visit (HOSPITAL_COMMUNITY): Payer: Self-pay

## 2022-10-26 NOTE — Telephone Encounter (Signed)
Pharmacy Patient Advocate Encounter  Prior Authorization for Humira 91m/0.4ml has been approved.    PA# [id number] dates: 09/26/2022 through 10/26/2023    Patient Advocate Encounter   Received notification from [employer] that prior authorization for Humira 48m0.4ml is required.   PA submitted on 10/25/2022 Key [id number] Status is pending       AyJoneen BoersCPChittendenatient Advocate Specialist CoCascadiaatient Advocate Team Direct Number: [phone]: [phone]

## 2022-11-23 ENCOUNTER — Other Ambulatory Visit: Payer: Self-pay | Admitting: Physician Assistant

## 2022-11-23 ENCOUNTER — Ambulatory Visit
Admission: RE | Admit: 2022-11-23 | Discharge: 2022-11-23 | Disposition: A | Discharge status: Home / Self Care | Payer: BC Managed Care – PPO | Source: Ambulatory Visit | Attending: Physician Assistant | Admitting: Physician Assistant

## 2022-11-23 DIAGNOSIS — R051 Acute cough: Secondary | ICD-10-CM

## 2023-01-01 ENCOUNTER — Ambulatory Visit (HOSPITAL_BASED_OUTPATIENT_CLINIC_OR_DEPARTMENT_OTHER)
Admission: RE | Admit: 2023-01-01 | Discharge: 2023-01-01 | Disposition: A | Discharge status: Home / Self Care | Payer: BC Managed Care – PPO | Source: Ambulatory Visit | Attending: Hematology and Oncology | Admitting: Hematology and Oncology

## 2023-01-01 ENCOUNTER — Other Ambulatory Visit: Payer: Self-pay

## 2023-01-01 ENCOUNTER — Inpatient Hospital Stay: Payer: BC Managed Care – PPO | Attending: Hematology and Oncology

## 2023-01-01 DIAGNOSIS — K5190 Ulcerative colitis, unspecified, without complications: Secondary | ICD-10-CM | POA: Insufficient documentation

## 2023-01-01 DIAGNOSIS — Z79899 Other long term (current) drug therapy: Secondary | ICD-10-CM | POA: Diagnosis not present

## 2023-01-01 DIAGNOSIS — I824Z1 Acute embolism and thrombosis of unspecified deep veins of right distal lower extremity: Secondary | ICD-10-CM

## 2023-01-01 DIAGNOSIS — Z86718 Personal history of other venous thrombosis and embolism: Secondary | ICD-10-CM | POA: Insufficient documentation

## 2023-01-01 LAB — D-DIMER, QUANTITATIVE: D-Dimer, Quant: 0.46 ug{FEU}/mL (ref 0.00–0.50)

## 2023-01-01 NOTE — Progress Notes (Signed)
Right lower extremity venous duplex has been completed. Preliminary results can be found in CV Proc through chart review.  Results were given to Dr. Lindi Adie.  01/01/23 2:13 PM Carlos Levering RVT

## 2023-01-04 ENCOUNTER — Inpatient Hospital Stay (HOSPITAL_BASED_OUTPATIENT_CLINIC_OR_DEPARTMENT_OTHER): Payer: BC Managed Care – PPO | Admitting: Hematology and Oncology

## 2023-01-04 DIAGNOSIS — I824Z1 Acute embolism and thrombosis of unspecified deep veins of right distal lower extremity: Secondary | ICD-10-CM | POA: Diagnosis not present

## 2023-01-04 NOTE — Progress Notes (Signed)
HEMATOLOGY-ONCOLOGY TELEPHONE VISIT PROGRESS NOTE  I connected with our patient on 01/04/23 at  8:30 AM EST by telephone and verified that I am speaking with the correct person using two identifiers.  I discussed the limitations, risks, security and privacy concerns of performing an evaluation and management service by telephone and the availability of in person appointments.  I also discussed with the patient that there may be a patient responsible charge related to this service. The patient expressed understanding and agreed to proceed.   History of Present Illness: Telephone follow up to discuss the results of her blood work and ultrasound  REVIEW OF SYSTEMS:   Constitutional: Denies fevers, chills or abnormal weight loss All other systems were reviewed with the patient and are negative.  Observations/Objective:     Assessment Plan:  DVT (deep venous thrombosis) (HCC) Right calf pain: Led to ultrasound 07/02/2022: Acute DVT involving posterior tibial vein, anterior tibial vein Distal vein DVT History of ulcerative colitis on Humira   Risk factors: None (chews nicotine gum)   Current treatment: Eliquis started 07/02/2022 completed 01/04/2023 Eliquis toxicities: None   Hypercoagulability panel: 1.  Beta-2 glycoprotein antibodies: Negative 2. anticardiolipin IgM low positive: 26 3.  Antithrombin III: 107% 4.  Protein S antigen: 81%, protein C antigen: 108% 5.  Prothrombin gene mutation: Not detected, factor V Leiden: Not detected 6.  Lupus anticoagulant: Negative  01/01/2023: D-dimer: Negative (0.46) 01/01/2023: Ultrasound lower extremity: No DVT  Based on negative D-dimer and negative ultrasound I recommended discontinuation of anticoagulation at this time.  Patient understands that if she has another blood clot then she might need to remain on anticoagulation on a long-term basis.  Instructed her to call us if she develops any symptoms of blood clots. Return to clinic on an  as-needed basis.  I discussed the assessment and treatment plan with the patient. The patient was provided an opportunity to ask questions and all were answered. The patient agreed with the plan and demonstrated an understanding of the instructions. The patient was advised to call back or seek an in-person evaluation if the symptoms worsen or if the condition fails to improve as anticipated.   I provided 12 minutes of non-face-to-face time during this encounter.  This includes time for charting and coordination of care   Harriette Ohara, MD

## 2023-01-04 NOTE — Assessment & Plan Note (Signed)
Right calf pain: Led to ultrasound 07/02/2022: Acute DVT involving posterior tibial vein, anterior tibial vein Distal vein DVT History of ulcerative colitis on Humira   Risk factors: None (chews nicotine gum)   Current treatment: Eliquis started 07/02/2022 completed 01/04/2023 Eliquis toxicities: None   Hypercoagulability panel: 1.  Beta-2 glycoprotein antibodies: Negative 2. anticardiolipin IgM low positive: 26 3.  Antithrombin III: 107% 4.  Protein S antigen: 81%, protein C antigen: 108% 5.  Prothrombin gene mutation: Not detected, factor V Leiden: Not detected 6.  Lupus anticoagulant: Negative  01/01/2023: D-dimer: Negative (0.46) 01/01/2023: Ultrasound lower extremity: No DVT  Based on negative D-dimer and negative ultrasound I recommended discontinuation of anticoagulation at this time.  Patient understands that if she has another blood clot then she might need to remain on anticoagulation for life.

## 2023-01-05 ENCOUNTER — Other Ambulatory Visit (HOSPITAL_COMMUNITY): Payer: Self-pay

## 2023-01-11 ENCOUNTER — Telehealth: Payer: Self-pay

## 2023-01-11 NOTE — Telephone Encounter (Signed)
Received a faxed notification that the Humira PA will expire in 15 days.  Thank you for your help.

## 2023-02-05 NOTE — Telephone Encounter (Signed)
Submitted a Prior Authorization request to [facility] for HUMIRA via CoverMyMeds. Will update once we receive a response.  Key: [id number] - PA Case ID: [id number]

## 2023-02-07 ENCOUNTER — Other Ambulatory Visit: Payer: Self-pay | Admitting: *Deleted

## 2023-02-07 DIAGNOSIS — M79601 Pain in right arm: Secondary | ICD-10-CM

## 2023-02-07 NOTE — Progress Notes (Signed)
Received call from pt with complaint of new right arm redness, pain, swelling, and warmth.  Pt denies recent injury or trauma.  Per MD due to pt hx of DVT, pt needing VAS [place] to r/o DVT and [facility] visit.   Orders placed, appt scheduled and pt verbalized understanding of appt date and time.

## 2023-02-08 ENCOUNTER — Inpatient Hospital Stay: Payer: BC Managed Care – PPO | Attending: Hematology and Oncology | Admitting: Physician Assistant

## 2023-02-08 ENCOUNTER — Other Ambulatory Visit: Payer: Self-pay

## 2023-02-08 ENCOUNTER — Ambulatory Visit (HOSPITAL_COMMUNITY)
Admission: RE | Admit: 2023-02-08 | Discharge: 2023-02-08 | Disposition: A | Discharge status: Home / Self Care | Payer: BC Managed Care – PPO | Source: Ambulatory Visit | Attending: Hematology and Oncology | Admitting: Hematology and Oncology

## 2023-02-08 VITALS — BP 132/81 | HR 89 | Temp 98.4°F | Resp 16 | Wt 123.5 lb

## 2023-02-08 DIAGNOSIS — Z86718 Personal history of other venous thrombosis and embolism: Secondary | ICD-10-CM | POA: Diagnosis present

## 2023-02-08 DIAGNOSIS — M79601 Pain in right arm: Secondary | ICD-10-CM | POA: Diagnosis not present

## 2023-02-08 DIAGNOSIS — Z7901 Long term (current) use of anticoagulants: Secondary | ICD-10-CM | POA: Diagnosis not present

## 2023-02-08 DIAGNOSIS — Z87891 Personal history of nicotine dependence: Secondary | ICD-10-CM | POA: Insufficient documentation

## 2023-02-08 MED ORDER — DOXYCYCLINE HYCLATE 100 MG PO TABS: 100.0000 mg | ORAL_TABLET | Freq: Two times a day (BID) | ORAL | 0 refills | Status: AC

## 2023-02-08 NOTE — Progress Notes (Signed)
Symptom Management Consult note Ignacio    Patient Care Team: Alroy Dust, L.Marlou Sa, MD as PCP - General (Family Medicine)    Name / MRN / DOB: Gloria Green  [id number]  Oct 20, 1991   Date of visit: 02/08/2023   Chief Complaint/Reason for visit: right arm pain   Current Therapy: None     ASSESSMENT & PLAN: Patient is a 32 y.o. female  with oncologic history of DVT followed by Dr. Lindi Adie.  I have viewed most recent oncology note and lab work.     #Right arm pain -Chart review shows patient was on eliquis from 07/02/22-01/04/23. During last heme/onc visit patient had both negative d-dimer and lower extremity US. Plan was to discontinue anticoagulation however if she develops DVT in the future anticoagulation for  long-term anticoagulation might be recommended.  -DVT study is negative. It does show small area of subcutaneous edema consistent with the area of erythema and pain. Discussed results with patient. -Patient has large area of erythema on right forearm.  No wound or break in the skin.  Right upper extremity is neurovascularly intact. Exam is consistent with cellulitis.  Will treat with course of doxycycline and encouraged Tylenol and ibuprofen at home as well. -If symptoms do not improve encourage patient to follow-up here or with her PCP. Strict ED precautions discussed should symptoms worsen.     Heme/Onc History: Oncology History   No history exists.      Interval history-: Gloria Green is a 32 y.o. female with pertinent history of DVT no longer on anticoagulation seen in [facility] today with chief complaint of right arm pain x 4 days.  Patient presents unaccompanied to clinic.  Patient states when she woke up Monday morning she noticed that her right forearm was red and swollen.  The pain was mild at that time.  She has been taking Tylenol and applying a heating pad with minimal symptom relief.  Patient cannot recall any injury or trauma to her  arm.  She is now reporting localized aching pain.  Pain is worse with palpation.  With her history of DVT she wanted to make sure that was not the cause of her current symptoms.   ROS  All other systems are reviewed and are negative for acute change except as noted in the HPI.    Allergies  Allergen Reactions   Sulfa Antibiotics Hives   Latex Rash   Wound Dressing Adhesive Rash    Skin Tearing     Past Medical History:  Diagnosis Date   Anal fissure    Anemia    Anxiety    DVT (deep venous thrombosis) (HCC)    Hemorrhoid    Ulcerative colitis [facility])      Past Surgical History:  Procedure Laterality Date   BIOPSY  01/14/2022   Procedure: BIOPSY;  Surgeon: Milus Banister, MD;  Location: [facility];  Service: Endoscopy;;   CESAREAN SECTION  09/11/2021   COSMETIC SURGERY  12/18/2010   nose   ESOPHAGOGASTRODUODENOSCOPY (EGD) WITH PROPOFOL N/A 01/14/2022   Procedure: ESOPHAGOGASTRODUODENOSCOPY (EGD) WITH PROPOFOL;  Surgeon: Milus Banister, MD;  Location: [facility];  Service: Endoscopy;  Laterality: N/A;   FLEXIBLE SIGMOIDOSCOPY N/A 01/14/2022   Procedure: FLEXIBLE SIGMOIDOSCOPY;  Surgeon: Milus Banister, MD;  Location: [facility];  Service: Endoscopy;  Laterality: N/A;   MYOMECTOMY N/A 06/16/2019   Procedure: ABDOMINAL MYOMECTOMY;  Surgeon: Molli Posey, MD;  Location: [facility];  Service: Gynecology;  Laterality: N/A;   WISDOM TOOTH EXTRACTION      Social History   Socioeconomic History   Marital status: Married    Spouse name: Not on file   Number of children: 2   Years of education: Not on file   Highest education level: Not on file  Occupational History   Occupation: Teacher  Tobacco Use   Smoking status: Former    Types: Cigarettes    Quit date: 06/11/2017    Years since quitting: 5.6   Smokeless tobacco: Never  Vaping Use   Vaping Use: Former   Quit date: 10/11/2018  Substance and Sexual Activity   Alcohol use: Not Currently     Comment: occ   Drug use: Never   Sexual activity: Yes  Other Topics Concern   Not on file  Social History Narrative   Not on file   Social Determinants of Health   Financial Resource Strain: Not on file  Food Insecurity: Not on file  Transportation Needs: Not on file  Physical Activity: Not on file  Stress: Not on file  Social Connections: Not on file  Intimate Partner Violence: Not on file    Family History  Problem Relation Age of Onset   Hypertension Mother    Obesity Father    Hypertension Brother    Lung cancer Maternal Grandmother    Lung cancer Maternal Grandfather    Colon cancer Neg Hx    Esophageal cancer Neg Hx    Stomach cancer Neg Hx      Current Outpatient Medications:    doxycycline (VIBRA-TABS) 100 MG tablet, Take 1 tablet (100 mg total) by mouth 2 (two) times daily for 7 days., Disp: 14 tablet, Rfl: 0   apixaban (ELIQUIS) 5 MG TABS tablet, Take 1 tablet (5 mg total) by mouth 2 (two) times daily., Disp: 60 tablet, Rfl: 5   cephALEXin (KEFLEX) 500 MG capsule, Take 1 capsule (500 mg total) by mouth 2 (two) times daily., Disp: 14 capsule, Rfl: 0   cholecalciferol (VITAMIN D3) 25 MCG (1000 UNIT) tablet, Take 1,000 Units by mouth daily. (Patient not taking: Reported on 07/11/2022), Disp: , Rfl:    cyclobenzaprine (FLEXERIL) 10 MG tablet, Take 1 tablet (10 mg total) by mouth 2 (two) times daily as needed for up to 15 doses for muscle spasms. (Patient not taking: Reported on 07/11/2022), Disp: 15 tablet, Rfl: 0   HUMIRA PEN 40 MG/0.4ML PNKT, INJECT 1 PEN UNDER THE SKIN EVERY 7 DAYS., Disp: 4 each, Rfl: 6   Multiple Vitamin (MULTIVITAMIN) tablet, Take 4 tablets by mouth daily. Prenatal Vitamin (Patient not taking: Reported on 07/11/2022), Disp: , Rfl:    ondansetron (ZOFRAN) 4 MG tablet, Take 1 tablet (4 mg total) by mouth daily as needed for nausea or vomiting. (Patient not taking: Reported on 07/11/2022), Disp: 30 tablet, Rfl: 0   promethazine (PHENERGAN) 12.5 MG  tablet, Take 1 tablet (12.5 mg total) by mouth daily as needed for nausea or vomiting. (Patient not taking: Reported on 07/11/2022), Disp: 30 tablet, Rfl: 0  PHYSICAL EXAM: ECOG FS:1 - Symptomatic but completely ambulatory    Vitals:   02/08/23 1503  BP: 132/81  Pulse: 89  Resp: 16  Temp: 98.4 F (36.9 C)  TempSrc: Oral  SpO2: 100%  Weight: 123 lb 8 oz (56 kg)   Physical Exam Vitals and nursing note reviewed.  Constitutional:      Appearance: She is well-developed. She is not ill-appearing or toxic-appearing.  HENT:  Head: Normocephalic.     Nose: Nose normal.  Eyes:     Conjunctiva/sclera: Conjunctivae normal.  Neck:     Vascular: No JVD.  Cardiovascular:     Rate and Rhythm: Normal rate and regular rhythm.     Pulses: Normal pulses.     Heart sounds: Normal heart sounds.  Pulmonary:     Effort: Pulmonary effort is normal.     Breath sounds: Normal breath sounds.  Abdominal:     General: There is no distension.  Musculoskeletal:     Cervical back: Normal range of motion.  Skin:    General: Skin is warm and dry.     Comments: Approximately 10x4 cm area of blanching erythema on lateral aspect of distal right forearm with induration. Tender to palpation. No palpable fluctuance. No gross abscess. No break in the skin or wound  Neurological:     Mental Status: She is oriented to person, place, and time.        LABORATORY DATA: I have reviewed the data as listed    Latest Ref Rng & Units 07/30/2022    4:01 AM 07/24/2022    4:20 PM 07/11/2022   12:00 PM  CBC  WBC 4.0 - 10.5 K/uL 7.4  7.4  5.9   Hemoglobin 12.0 - 15.0 g/dL 11.3  12.4  12.1   Hematocrit 36.0 - 46.0 % 35.0  37.6  37.0   Platelets 150 - 400 K/uL 242  278  259.0         Latest Ref Rng & Units 07/30/2022    4:01 AM 07/11/2022   12:00 PM 06/09/2022    9:15 AM  CMP  Glucose 70 - 99 mg/dL 104  81  86   BUN 6 - 20 mg/dL 25  15  19   $ Creatinine 0.44 - 1.00 mg/dL 0.59  0.61  0.72   Sodium 135 - 145  mmol/L 139  136  139   Potassium 3.5 - 5.1 mmol/L 4.2  4.0  4.4   Chloride 98 - 111 mmol/L 107  103  103   CO2 22 - 32 mmol/L 26  27  28   $ Calcium 8.9 - 10.3 mg/dL 8.9  9.1  9.3   Total Protein 6.0 - 8.3 g/dL  7.6  7.2   Total Bilirubin 0.2 - 1.2 mg/dL  0.7  0.9   Alkaline Phos 39 - 117 U/L  67  63   AST 0 - 37 U/L  11  14   ALT 0 - 35 U/L  8  12        RADIOGRAPHIC STUDIES (from last 24 hours if applicable) I have personally reviewed the radiological images as listed and agreed with the findings in the report. VAS [place] UPPER EXTREMITY VENOUS DUPLEX  Result Date: 02/08/2023 UPPER VENOUS STUDY  Patient Name:  MESHELL WHEATON  Date of Exam:   02/08/2023 Medical Rec #: [id number]           Accession #:    [id number] Date of Birth: 1991/09/06          Patient Gender: F Patient Age:   67 years Exam Location:  [facility] Procedure:      VAS [place] UPPER EXTREMITY VENOUS DUPLEX Referring Phys: Nicholas Lose --------------------------------------------------------------------------------  Indications: localized pain, redness, and swelling of forearm Risk Factors: Hx of RLE DVT (2023). Comparison Study: No previous UEV exams Performing Technologist: Jody Hill RVT, RDMS  Examination Guidelines: A complete evaluation includes B-mode  imaging, spectral Doppler, color Doppler, and power Doppler as needed of all accessible portions of each vessel. Bilateral testing is considered an integral part of a complete examination. Limited examinations for reoccurring indications may be performed as noted.  Right Findings: +----------+------------+---------+-----------+----------+-------+ RIGHT     CompressiblePhasicitySpontaneousPropertiesSummary +----------+------------+---------+-----------+----------+-------+ IJV           Full       No        Yes                      +----------+------------+---------+-----------+----------+-------+ Subclavian    Full       Yes       Yes                       +----------+------------+---------+-----------+----------+-------+ Axillary      Full       Yes       Yes                      +----------+------------+---------+-----------+----------+-------+ Brachial      Full                                          +----------+------------+---------+-----------+----------+-------+ Radial        Full                                          +----------+------------+---------+-----------+----------+-------+ Ulnar         Full                                          +----------+------------+---------+-----------+----------+-------+ Cephalic      Full                                          +----------+------------+---------+-----------+----------+-------+ Basilic       Full       Yes       Yes                      +----------+------------+---------+-----------+----------+-------+ small area of subcutaneous edema seen in area of concern (lateral distal forearm)  Left Findings: +----------+------------+---------+-----------+----------+-------+ LEFT      CompressiblePhasicitySpontaneousPropertiesSummary +----------+------------+---------+-----------+----------+-------+ Subclavian    Full       Yes       Yes                      +----------+------------+---------+-----------+----------+-------+  Summary:  Right: No evidence of deep vein thrombosis in the upper extremity. No evidence of superficial vein thrombosis in the upper extremity. Subcutaneous edema (mild) seen in lateral distal forearm.  Left: No evidence of thrombosis in the subclavian.  *See table(s) above for measurements and observations.  Diagnosing physician: Monica Martinez MD Electronically signed by Monica Martinez MD on 02/08/2023 at 4:20:00 PM.    Final         Visit Diagnosis: 1. Right arm pain      No orders of the defined types were placed in this encounter.   All questions were answered.  The patient knows to call the clinic with any  problems, questions or concerns. No barriers to learning was detected.  I have spent a total of 30 minutes minutes of face-to-face and non-face-to-face time, preparing to see the patient, obtaining and/or reviewing separately obtained history, performing a medically appropriate examination, counseling and educating the patient, ordering tests, documenting clinical information in the electronic health record.  Thank you for allowing me to participate in the care of this patient.    Barrie Folk, PA-C Department of Hematology/Oncology [facility] at [facility] Phone: [phone]  [phone]    02/08/2023 4:24 PM

## 2023-02-09 NOTE — Telephone Encounter (Signed)
Do you want me to write an appeal or do you want to to ask for a provider courtesy review which is basically a peer to peer via telephone?

## 2023-02-09 NOTE — Telephone Encounter (Signed)
Yes we will need to appeal it. Please let me know if I need to do a peer to peer. Thanks

## 2023-02-09 NOTE — Telephone Encounter (Signed)
Pharmacy Patient Advocate Encounter  Received notification from [facility] that the request for prior authorization for HUMIRA '40MG'$  has been denied due to   .    Please be advised we currently do not have a Pharmacist to review denials, therefore you will need to process appeals accordingly as needed. Thanks for your support at this time.   You may call [phone] or fax [phone], to appeal.

## 2023-02-09 NOTE — Telephone Encounter (Signed)
Appeal letter faxed to Between Dept. And marked as urgent.

## 2023-02-14 ENCOUNTER — Encounter: Payer: Self-pay | Admitting: Gastroenterology

## 2023-02-14 NOTE — Telephone Encounter (Signed)
PT is calling to get an update on the Humira RX. Please advise.

## 2023-02-15 ENCOUNTER — Other Ambulatory Visit: Payer: Self-pay

## 2023-02-15 NOTE — Telephone Encounter (Signed)
Changing to Stelara.

## 2023-02-15 NOTE — Telephone Encounter (Signed)
This is generic number computer generated, no option to speak to anyone, just got constantly redirected.  I am happy to do peer to peer  Can you please check with infusion center if she has coverage for skyrizi?

## 2023-02-15 NOTE — Telephone Encounter (Signed)
We cannot get samples for the patient. I spoke with 4 different representatives with [facility] and then [place]. The appeal letter faxed on Friday 02/09/23 and marked urgent is not in the system. [employer] says they cannot help.  Would you try the peer to peer? [phone] Member ID [id number]

## 2023-02-15 NOTE — Telephone Encounter (Signed)
Called and discussed with [facility] regarding switching to [employer].  Discussed benefits and risks associated with it. She had detectable antibody with low drug trough on biweekly Humira in March 2023, had persistent elevation in inflammatory markers and fecal calprotectin.  She was able to achieve clinical remission with prolonged steroid taper and weekly Humira dosing.  Given insurance is denying weekly Humira dosing, will plan to switch to [employer].

## 2023-02-15 NOTE — Telephone Encounter (Signed)
Agree, Gloria Green has pending approval for UC

## 2023-02-21 ENCOUNTER — Telehealth: Payer: Self-pay | Admitting: Pharmacy Technician

## 2023-02-21 NOTE — Telephone Encounter (Signed)
Dr. Silverio Decamp, Gloria Green has been denied due to patient has not tried ALL standard medication.  6-mercaptopurine Azathioprine Cyclosporine Mesalamine Corticosteroids  If you would like to appeal or conduct a peer to peer please call [phone] Ref: [id number]

## 2023-02-21 NOTE — Telephone Encounter (Signed)
She has tried corticosteroids, mesalamine, and 6MP and failed in the past prior to [facility]

## 2023-02-23 ENCOUNTER — Other Ambulatory Visit: Payer: Self-pay | Admitting: Gastroenterology

## 2023-02-26 NOTE — Telephone Encounter (Signed)
A peer to peer will have to be done or an appeal. Do you want an appeal? The Humira is not covered now. She would have to try the bio-similar.

## 2023-02-26 NOTE — Telephone Encounter (Signed)
Yes, I will appeal, can you send me the number to call and process. Thanks

## 2023-02-27 NOTE — Telephone Encounter (Signed)
I requested a Courtesy Review last week and it's still pending as of this morning '@11'$ :20.  Im hopeful we will have a determination by tomorrow. '@Elizabeth'$ , have they reached out to you this morning?  Sometimes updates are slow to post and Reps dont have current/updated  information.

## 2023-02-27 NOTE — Telephone Encounter (Addendum)
Courtesy review has been approved. Patient will be scheduled as soon as possible.  Auth Submission: APPROVED Payer: [employer] Medication & CPT/J Code(s) submitted: [facility] (Ustekinumab) [id number] Route of submission (phone, fax, portal):  Phone [phone] Fax [phone] Auth type: Buy/Bill Units/visits requested: x1 Reference number: [id number] Approval from: 02/16/23 to 02/15/24   Stelara co-pay card: APPROVED CARD: [id number] EXP: 3/27 CVV 396 Pt ID: [id number]  [email or web address]  please submit auth for Stelara SQ.

## 2023-02-28 NOTE — Telephone Encounter (Signed)
Great thanks. Beth, can you please inform Junette

## 2023-03-02 ENCOUNTER — Encounter: Payer: Self-pay | Admitting: Gastroenterology

## 2023-03-06 ENCOUNTER — Other Ambulatory Visit: Payer: Self-pay | Admitting: Gastroenterology

## 2023-03-07 ENCOUNTER — Other Ambulatory Visit: Payer: Self-pay

## 2023-03-07 MED ORDER — STELARA 90 MG/ML ~~LOC~~ SOSY: 90.0000 mg | PREFILLED_SYRINGE | SUBCUTANEOUS | 6 refills | Status: DC

## 2023-03-08 ENCOUNTER — Telehealth: Payer: Self-pay | Admitting: Gastroenterology

## 2023-03-08 ENCOUNTER — Ambulatory Visit (INDEPENDENT_AMBULATORY_CARE_PROVIDER_SITE_OTHER): Payer: BC Managed Care – PPO | Admitting: *Deleted

## 2023-03-08 ENCOUNTER — Encounter: Payer: Self-pay | Admitting: Gastroenterology

## 2023-03-08 VITALS — BP 118/76 | HR 74 | Temp 99.0°F | Resp 18 | Ht 61.0 in | Wt 120.4 lb

## 2023-03-08 DIAGNOSIS — K5190 Ulcerative colitis, unspecified, without complications: Secondary | ICD-10-CM | POA: Diagnosis not present

## 2023-03-08 MED ORDER — USTEKINUMAB 130 MG/26ML IV SOLN
390.0000 mg | Freq: Once | INTRAVENOUS | Status: AC
  Administered 2023-03-08: 390 mg via INTRAVENOUS
  Filled 2023-03-08: qty 78

## 2023-03-08 NOTE — Progress Notes (Signed)
Diagnosis: Ulcerative Colitis  Provider:  Marshell Garfinkel MD  Procedure: Infusion  IV Type: Peripheral, IV Location: R Antecubital  Stelera (Ustekinumab), Dose: 390  Infusion Start Time: 1511 pm  Infusion Stop Time: 1625 pm  Post Infusion IV Care: Observation period completed and Peripheral IV Discontinued  Discharge: Condition: Good, Destination: Home . AVS Provided  Performed by:  Oren Beckmann, RN

## 2023-03-08 NOTE — Telephone Encounter (Signed)
Patient has received the Stelara infusion. She will now need the Stelara injectable approved per [facility] Thank you

## 2023-03-08 NOTE — Patient Instructions (Signed)
Ustekinumab Injection What is this medication? USTEKINUMAB ([place] te KIN ue mab) treats autoimmune conditions, such as psoriasis, arthritis, Crohn's disease, and ulcerative colitis. It works by slowing down an overactive immune system. It is a monoclonal antibody. This medicine may be used for other purposes; ask your health care provider or pharmacist if you have questions. COMMON BRAND NAME(S): Stelara What should I tell my care team before I take this medication? They need to know if you have any of these conditions: Cancer Diabetes History of skin cancer Immune system problems Infection, such as a bacterial or viral infection, chickenpox, cold sores, herpes, or history of infections New or changing lesions on your skin Receiving or have received allergy shots Receive or have received phototherapy for the skin Recently received or scheduled to receive a vaccine Tuberculosis, a positive skin test for tuberculosis, or recent close contact with someone who has tuberculosis An unusual or allergic reaction to ustekinumab, latex, other medications, foods, dyes, or preservatives Pregnant or trying to get pregnant Breast-feeding How should I use this medication? This medication is injected under the skin or into a vein. It is usually given by your care team in a hospital or clinic setting. It may also be given at home. If you get this medication at home, you will be taught how to prepare and give it. Use it exactly as directed. Take it as directed on the prescription label. Keep taking it unless your care team tells you to stop. It is important that you put your used needles and syringes in a special sharps container. Do not put them in a trash can. If you do not have a sharps container, call your pharmacist or care team to get one. A special MedGuide will be given to you by the pharmacist with each prescription and refill. Be sure to read this information carefully each time. Talk to your care team  about the use of this medication in children. While it may be prescribed for children as young as 6 years for selected conditions, precautions do apply. Overdosage: If you think you have taken too much of this medicine contact a poison control center or emergency room at once. NOTE: This medicine is only for you. Do not share this medicine with others. What if I miss a dose? If you get this medication at the hospital or clinic: It is important not to miss your dose. Call your care team if you are unable to keep an appointment. If you give yourself this medication at home: If you miss a dose, take it as soon as you can. Then continue your normal schedule. Do not take double or extra doses. Call your care team with questions. What may interact with this medication? Do not take this medication with any of the following: Live virus vaccines This medication may also interact with the following: Biologic medications, such as abatacept, adalimumab, anakinra, certolizumab, etanercept, golimumab, infliximab, rituximab, secukinumab, tocilizumab Cyclosporine Warfarin This list may not describe all possible interactions. Give your health care provider a list of all the medicines, herbs, non-prescription drugs, or dietary supplements you use. Also tell them if you smoke, drink alcohol, or use illegal drugs. Some items may interact with your medicine. What should I watch for while using this medication? Visit your care team for regular checks on your progress. Your condition will be monitored carefully while you are receiving this medication. Tell your care team if your symptoms do not start to get better or if they get worse.  You will be tested for tuberculosis (TB) before you start this medication. If your care team prescribes any medication for TB, you should start taking the TB medication before starting this medication. Make sure to finish the full course of TB medication. This medication may increase your  risk of getting an infection. Call your care team for advice if you get fever, chills, sore throat, or other symptoms of a cold or flu. Do not treat yourself. Try to avoid being around people who are sick. Talk to your care team about your risk of cancer. You may be more at risk for certain types of cancers if you take this medication. Certain genetic factors may decrease the safety of this medication. Your care team may use genetic tests to determine treatment. What side effects may I notice from receiving this medication? Side effects that you should report to your care team as soon as possible: Allergic reactions--skin rash, itching, hives, swelling of the face, lips, tongue, or throat Change in your skin, such as a new growth, a sore that doesn't heal, or a change in a mole Dry cough, shortness of breath or trouble breathing Infection--fever, chills, cough, sore throat, wounds that don't heal, pain or trouble when passing urine, general feeling of discomfort or being unwell Sudden and severe headache, confusion, change in vision, seizures, which may be signs of posterior reversible encephalopathy syndrome (PRES) Side effects that usually do not require medical attention (report to your care team if they continue or are bothersome): Diarrhea Dizziness Fatigue Headache Pain, redness, or irritation at injection site Runny or stuffy nose Sore throat This list may not describe all possible side effects. Call your doctor for medical advice about side effects. You may report side effects to FDA at 1-800-FDA-1088. Where should I keep my medication? Keep out of the reach of children and pets. Store in a refrigerator or at room temperature between 20 and 25 degrees C (68 and 77 degrees F). Refrigeration (preferred): Store the prefilled syringes or vials in the refrigerator. Keep this medication in the original carton. Protect from light. Do not freeze. Do not shake. The vials should be stored  upright. Get rid of any unused medication after the expiration date. Room temperature: A prefilled syringe may be stored at room temperature for up to 30 days. Keep this medication in the original carton. Protect from light. Record the date when it is removed from the refrigerator on the carton. Once a syringe has been stored at room temperature, do not put it back in the refrigerator. Get rid of the syringe away after 30 days at room temperature or the expiration date, whichever comes first. To get rid of medications that are no longer needed or have expired: Take the medication to a medication take-back program. Check with your pharmacy or law enforcement to find a location. If you cannot return the medication, ask your pharmacist or care team how to get rid of the medication safely. NOTE: This sheet is a summary. It may not cover all possible information. If you have questions about this medicine, talk to your doctor, pharmacist, or health care provider.  2023 Elsevier/Gold Standard (2022-02-15 00:00:00)

## 2023-03-08 NOTE — Telephone Encounter (Signed)
Patient called states she received a call from [facility] and they advised her they need more information for the Stelara order. Wanted to let you know.

## 2023-03-14 ENCOUNTER — Other Ambulatory Visit: Payer: Self-pay

## 2023-03-14 NOTE — Telephone Encounter (Signed)
Received notification from [facility] regarding a prior authorization for [facility]. Authorization has been APPROVED from 03/14/23 to 03/12/24  Authorization # (Key: [id number]) - [id number]  [place] and advised and they successfully process claim  Stelara co-pay card: APPROVED CARD: [id number] EXP: 3/27 CVV 396 Pt ID: [id number]

## 2023-03-14 NOTE — Telephone Encounter (Signed)
Submitted a Prior Authorization request to [facility] for [facility] via CoverMyMeds. Will update once we receive a response.  Key: [id number] - [place] Case ID: [id number]

## 2023-03-16 ENCOUNTER — Encounter: Payer: Self-pay | Admitting: Gastroenterology

## 2023-03-21 ENCOUNTER — Ambulatory Visit (AMBULATORY_SURGERY_CENTER): Payer: BC Managed Care – PPO | Admitting: *Deleted

## 2023-03-21 VITALS — Ht 61.0 in | Wt 120.0 lb

## 2023-03-21 DIAGNOSIS — Z8719 Personal history of other diseases of the digestive system: Secondary | ICD-10-CM

## 2023-03-21 MED ORDER — NA SULFATE-K SULFATE-MG SULF 17.5-3.13-1.6 GM/177ML PO SOLN
1.0000 | Freq: Once | ORAL | 0 refills | Status: AC
Start: 2023-03-21 — End: 2023-03-21

## 2023-03-21 NOTE — Progress Notes (Signed)

## 2023-04-13 ENCOUNTER — Other Ambulatory Visit: Payer: Self-pay

## 2023-04-13 ENCOUNTER — Encounter: Payer: Self-pay | Admitting: Gastroenterology

## 2023-04-13 ENCOUNTER — Ambulatory Visit (AMBULATORY_SURGERY_CENTER): Payer: BC Managed Care – PPO | Admitting: Gastroenterology

## 2023-04-13 VITALS — BP 96/52 | HR 81 | Temp 98.0°F | Resp 10 | Ht 62.0 in | Wt 120.0 lb

## 2023-04-13 DIAGNOSIS — Z8719 Personal history of other diseases of the digestive system: Secondary | ICD-10-CM

## 2023-04-13 DIAGNOSIS — Z79899 Other long term (current) drug therapy: Secondary | ICD-10-CM

## 2023-04-13 DIAGNOSIS — K51918 Ulcerative colitis, unspecified with other complication: Secondary | ICD-10-CM

## 2023-04-13 DIAGNOSIS — K6389 Other specified diseases of intestine: Secondary | ICD-10-CM | POA: Diagnosis not present

## 2023-04-13 DIAGNOSIS — K639 Disease of intestine, unspecified: Secondary | ICD-10-CM

## 2023-04-13 DIAGNOSIS — D649 Anemia, unspecified: Secondary | ICD-10-CM

## 2023-04-13 MED ORDER — SODIUM CHLORIDE 0.9 % IV SOLN
500.0000 mL | Freq: Once | INTRAVENOUS | Status: DC
Start: 2023-04-13 — End: 2023-04-13

## 2023-04-13 NOTE — Op Note (Signed)
[facility] Patient Name: Gloria Green Procedure Date: 04/13/2023 11:22 AM MRN: [id number] Endoscopist: Napoleon Form , MD, [id number] Age: 32 Referring MD:  Date of Birth: January 26, 1991 Gender: Female Account #: [id number] Procedure:                Colonoscopy Indications:              Follow-up of chronic ulcerative pancolitis, Disease                            activity assessment of chronic ulcerative                            pancolitis, Assess therapeutic response to therapy                            of chronic ulcerative pancolitis Medicines:                Monitored Anesthesia Care Procedure:                Pre-Anesthesia Assessment:                           - Prior to the procedure, a History and Physical                            was performed, and patient medications and                            allergies were reviewed. The patient's tolerance of                            previous anesthesia was also reviewed. The risks                            and benefits of the procedure and the sedation                            options and risks were discussed with the patient.                            All questions were answered, and informed consent                            was obtained. ASA Grade Assessment: II - A patient                            with mild systemic disease. After reviewing the                            risks and benefits, the patient was deemed in                            satisfactory condition to undergo the procedure.  After obtaining informed consent, the colonoscope                            was passed under direct vision. Throughout the                            procedure, the patient's blood pressure, pulse, and                            oxygen saturations were monitored continuously. The                            Olympus PCF-H190DL (#[id number]) Colonoscope was                            introduced  through the anus and advanced to the the                            terminal ileum, with identification of the                            appendiceal orifice and IC valve. The colonoscopy                            was performed without difficulty. The patient                            tolerated the procedure well. The quality of the                            bowel preparation was good. The terminal ileum,                            ileocecal valve, appendiceal orifice, and rectum                            were photographed. Scope In: 11:30:10 AM Scope Out: 11:46:27 AM Scope Withdrawal Time: 0 hours 12 minutes 15 seconds  Total Procedure Duration: 0 hours 16 minutes 17 seconds  Findings:                 The perianal and digital rectal examinations were                            normal.                           Inflammation was found in a continuous and                            circumferential pattern from the sigmoid colon to                            the cecum. This was graded using the Ulcerative  Colitis Endoscopic Index of Severity (UCEIS):                            patchy obliteration of vascular pattern (1), no                            visible blood (0), normal mucosa without visible                            erosions or ulcers (0), with UCEIS total score of 2                            (mild disease). When compared to the previous                            examination, the findings are improved. Biopsies                            were taken with a cold forceps for histology.                           Non-bleeding internal hemorrhoids were found during                            retroflexion. The hemorrhoids were small. Complications:            No immediate complications. Estimated Blood Loss:     Estimated blood loss was minimal. Impression:               - Pancolitis ulcerative colitis with UCEIS total                            score of 2  (mild disease), improved since last                            examination. Biopsied.                           - Non-bleeding internal hemorrhoids. Recommendation:           - Patient has a contact number available for                            emergencies. The signs and symptoms of potential                            delayed complications were discussed with the                            patient. Return to normal activities tomorrow.                            Written discharge instructions were provided to the  patient.                           - Resume previous diet.                           - Continue present medications.                           - Await pathology results.                           - Repeat colonoscopy date to be determined after                            pending pathology results are reviewed for                            surveillance based on pathology results.                           - Return to GI clinic at the next available                            appointment.                           - Due for IBD health maintenance labs CBC, CMP,                            CRP, vit, D, iron panel, B12, folate and TB                            quantiferon gold, we will schedule it. Napoleon Form, MD 04/13/2023 11:54:56 AM This report has been signed electronically.

## 2023-04-13 NOTE — Progress Notes (Signed)
Called to room to assist during endoscopic procedure.  Patient ID and intended procedure confirmed with present staff. Received instructions for my participation in the procedure from the performing physician.  

## 2023-04-13 NOTE — Progress Notes (Signed)
A and O x3. Report to RN. Tolerated MAC anesthesia well. 

## 2023-04-13 NOTE — Patient Instructions (Addendum)
Recommendation: Patient has a contact number available for                            emergencies. The signs and symptoms of potential                            delayed complications were discussed with the                            patient. Return to normal activities tomorrow.                            Written discharge instructions were provided to the                            patient.                           - Resume previous diet.                           - Continue present medications.                           - Await pathology results.                           - Repeat colonoscopy date to be determined after                            pending pathology results are reviewed for                            surveillance based on pathology results.                           - Return to GI clinic at the next available                            appointment.                           - Due for IBD health maintenance labs CBC, CMP,                            CRP, vit, D, iron panel, B12, folate and TB                            quantiferon gold, we will schedule it.  Handout on hemorrhoids given.  YOU HAD AN ENDOSCOPIC PROCEDURE TODAY AT THE [facility]:   Refer to the procedure report that was given to you for any specific questions about what was found during the examination.  If the procedure report does not answer your questions, please call your gastroenterologist to clarify.  If you requested that your care partner not be given the details of your procedure findings, then  the procedure report has been included in a sealed envelope for you to review at your convenience later.  YOU SHOULD EXPECT: Some feelings of bloating in the abdomen. Passage of more gas than usual.  Walking can help get rid of the air that was put into your GI tract during the procedure and reduce the bloating. If you had a lower endoscopy (such as a colonoscopy or flexible sigmoidoscopy) you may  notice spotting of blood in your stool or on the toilet paper. If you underwent a bowel prep for your procedure, you may not have a normal bowel movement for a few days.  Please Note:  You might notice some irritation and congestion in your nose or some drainage.  This is from the oxygen used during your procedure.  There is no need for concern and it should clear up in a day or so.  SYMPTOMS TO REPORT IMMEDIATELY:  Following lower endoscopy (colonoscopy or flexible sigmoidoscopy):  Excessive amounts of blood in the stool  Significant tenderness or worsening of abdominal pains  Swelling of the abdomen that is new, acute  Fever of 100F or higher  For urgent or emergent issues, a gastroenterologist can be reached at any hour by calling [phone]. Do not use MyChart messaging for urgent concerns.    DIET:  We do recommend a small meal at first, but then you may proceed to your regular diet.  Drink plenty of fluids but you should avoid alcoholic beverages for 24 hours.  ACTIVITY:  You should plan to take it easy for the rest of today and you should NOT DRIVE or use heavy machinery until tomorrow (because of the sedation medicines used during the test).    FOLLOW UP: Our staff will call the number listed on your records the next business day following your procedure.  We will call around 7:15- 8:00 am to check on you and address any questions or concerns that you may have regarding the information given to you following your procedure. If we do not reach you, we will leave a message.     If any biopsies were taken you will be contacted by phone or by letter within the next 1-3 weeks.  Please call us at [phone] if you have not heard about the biopsies in 3 weeks.    SIGNATURES/CONFIDENTIALITY: You and/or your care partner have signed paperwork which will be entered into your electronic medical record.  These signatures attest to the fact that that the information above on your After  Visit Summary has been reviewed and is understood.  Full responsibility of the confidentiality of this discharge information lies with you and/or your care-partner.

## 2023-04-13 NOTE — Progress Notes (Signed)
[facility] History and Physical   Primary Care Physician:  Clovis Riley, L.August Saucer, MD   Reason for Procedure:  History of ulcerative colitis Plan:    Surveillance colonoscopy with possible interventions as needed     HPI: Gloria Green is a very pleasant 32 y.o. female here for surveillance colonoscopy for ulcerative colitis to assess disease activity. Denies any nausea, vomiting, abdominal pain, melena or bright red blood per rectum  The risks and benefits as well as alternatives of endoscopic procedure(s) have been discussed and reviewed. All questions answered. The patient agrees to proceed.    Past Medical History:  Diagnosis Date   Allergy    SEASONAL   Anal fissure    Anemia    Anxiety    DVT (deep venous thrombosis) (HCC)    AUGUST 2023   Hemorrhoid    Ulcerative colitis [facility])     Past Surgical History:  Procedure Laterality Date   BIOPSY  01/14/2022   Procedure: BIOPSY;  Surgeon: Rachael Fee, MD;  Location: [facility];  Service: Endoscopy;;   CESAREAN SECTION  09/11/2021   COLONOSCOPY     COSMETIC SURGERY  12/18/2010   nose   ESOPHAGOGASTRODUODENOSCOPY (EGD) WITH PROPOFOL N/A 01/14/2022   Procedure: ESOPHAGOGASTRODUODENOSCOPY (EGD) WITH PROPOFOL;  Surgeon: Rachael Fee, MD;  Location: [facility];  Service: Endoscopy;  Laterality: N/A;   FLEXIBLE SIGMOIDOSCOPY N/A 01/14/2022   Procedure: FLEXIBLE SIGMOIDOSCOPY;  Surgeon: Rachael Fee, MD;  Location: [facility];  Service: Endoscopy;  Laterality: N/A;   MYOMECTOMY N/A 06/16/2019   Procedure: ABDOMINAL MYOMECTOMY;  Surgeon: Richarda Overlie, MD;  Location: [facility];  Service: Gynecology;  Laterality: N/A;   WISDOM TOOTH EXTRACTION      Prior to Admission medications   Medication Sig Start Date End Date Taking? Authorizing Provider  norethindrone (MICRONOR) 0.35 MG tablet Take 1 tablet by mouth daily.   Yes [provider]  Ustekinumab (STELARA IV)  Inject into the vein.   Yes [provider]    Current Outpatient Medications  Medication Sig Dispense Refill   norethindrone (MICRONOR) 0.35 MG tablet Take 1 tablet by mouth daily.     Ustekinumab (STELARA IV) Inject into the vein.     Current Facility-Administered Medications  Medication Dose Route Frequency Provider Last Rate Last Admin   0.9 %  sodium chloride infusion  500 mL Intravenous Once Napoleon Form, MD        Allergies as of 04/13/2023 - Review Complete 04/13/2023  Allergen Reaction Noted   Sulfa antibiotics Hives 04/02/2013   Latex Rash 06/09/2019   Wound dressing adhesive Rash 10/16/2021    Family History  Problem Relation Age of Onset   Hypertension Mother    Colitis Mother    Obesity Father    Hypertension Brother    Rectal cancer Paternal Aunt    Lung cancer Maternal Grandmother    Lung cancer Maternal Grandfather    Colon cancer Neg Hx    Esophageal cancer Neg Hx    Stomach cancer Neg Hx    Colon polyps Neg Hx    Crohn's disease Neg Hx    Ulcerative colitis Neg Hx     Social History   Socioeconomic History   Marital status: Married    Spouse name: Not on file   Number of children: 2   Years of education: Not on file   Highest education level: Not on file  Occupational History   Occupation: Runner, broadcasting/film/video  Tobacco Use  Smoking status: Former    Types: Cigarettes    Quit date: 06/11/2017    Years since quitting: 5.8   Smokeless tobacco: Never  Vaping Use   Vaping Use: Former   Quit date: 10/11/2018  Substance and Sexual Activity   Alcohol use: Not Currently    Comment: occ   Drug use: Never   Sexual activity: Yes  Other Topics Concern   Not on file  Social History Narrative   Not on file   Social Determinants of Health   Financial Resource Strain: Not on file  Food Insecurity: Not on file  Transportation Needs: Not on file  Physical Activity: Not on file  Stress: Not on file  Social Connections: Not on file  Intimate  Partner Violence: Not on file    Review of Systems:  All other review of systems negative except as mentioned in the HPI.  Physical Exam: Vital signs in last 24 hours: Blood Pressure 122/77   Pulse 89   Temperature 98 F (36.7 C)   Height 5\' 2"  (1.575 m)   Weight 120 lb (54.4 kg)   Last Menstrual Period 04/11/2023 (Exact Date)   Oxygen Saturation 100%   Body Mass Index 21.95 kg/m  General:   Alert, NAD Lungs:  Clear .   Heart:  Regular rate and rhythm Abdomen:  Soft, nontender and nondistended. Neuro/Psych:  Alert and cooperative. Normal mood and affect. A and O x 3  Reviewed labs, radiology imaging, old records and pertinent past GI work up  Patient is appropriate for planned procedure(s) and anesthesia in an ambulatory setting   K. Scherry Ran , MD [phone]

## 2023-04-13 NOTE — Progress Notes (Signed)
Pt's states no medical or surgical changes since previsit or office visit. 

## 2023-04-16 ENCOUNTER — Telehealth: Payer: Self-pay | Admitting: *Deleted

## 2023-04-16 NOTE — Telephone Encounter (Signed)
  Follow up Call-    Row Labels 04/13/2023   10:40 AM 11/03/2021    7:21 AM  Call back number   Section Header. No data exists in this row.    Post procedure Call Back phone  #   [phone]  Permission to leave phone message   Yes Yes     Patient questions:  Do you have a fever, pain , or abdominal swelling? No. Pain Score  0 *  Have you tolerated food without any problems? Yes.    Have you been able to return to your normal activities? Yes.    Do you have any questions about your discharge instructions: Diet   No. Medications  No. Follow up visit  No.  Do you have questions or concerns about your Care? No.  Actions: * If pain score is 4 or above: No action needed, pain <4.

## 2023-04-17 ENCOUNTER — Encounter: Payer: Self-pay | Admitting: Gastroenterology

## 2023-05-07 ENCOUNTER — Telehealth: Payer: Self-pay | Admitting: Gastroenterology

## 2023-05-07 NOTE — Telephone Encounter (Signed)
Inbound call from patient stating she needs assistance with administering her stillera. Please advise.

## 2023-05-08 MED ORDER — STELARA 90 MG/ML ~~LOC~~ SOSY: 90.0000 mg | PREFILLED_SYRINGE | SUBCUTANEOUS | 6 refills | Status: DC

## 2023-05-08 NOTE — Telephone Encounter (Signed)
Returned call to patient. Pt states that she previously declined the nurse ambassador for [facility] because she thought it was similar to the Humira injection. Pt states that she has received the Stelara and would like to discuss administration with nurse ambassador. Patient has been enrolled in [facility] with me. They will contact patient within the next couple of days. Pt will have a friend who is a nurse administer for her. I also reminded pt to come in for labs at her convenience. Patient verbalized understanding and had no concerns at the end of the call.   Stelara maintenance dose medication added back to med list.

## 2023-05-08 NOTE — Addendum Note (Signed)
Addended by: Missy Sabins on: 05/08/2023 10:36 AM   Modules accepted: Orders

## 2023-06-14 ENCOUNTER — Other Ambulatory Visit (INDEPENDENT_AMBULATORY_CARE_PROVIDER_SITE_OTHER): Payer: BC Managed Care – PPO

## 2023-06-14 ENCOUNTER — Encounter: Payer: Self-pay | Admitting: Gastroenterology

## 2023-06-14 ENCOUNTER — Ambulatory Visit: Payer: BC Managed Care – PPO | Admitting: Nurse Practitioner

## 2023-06-14 DIAGNOSIS — K51918 Ulcerative colitis, unspecified with other complication: Secondary | ICD-10-CM

## 2023-06-14 DIAGNOSIS — Z79899 Other long term (current) drug therapy: Secondary | ICD-10-CM

## 2023-06-14 DIAGNOSIS — D649 Anemia, unspecified: Secondary | ICD-10-CM

## 2023-06-14 LAB — CBC WITH DIFFERENTIAL/PLATELET
Basophils Absolute: 0 10*3/uL (ref 0.0–0.1)
Basophils Relative: 0.3 % (ref 0.0–3.0)
Eosinophils Absolute: 0.2 10*3/uL (ref 0.0–0.7)
Eosinophils Relative: 2.1 % (ref 0.0–5.0)
HCT: 41.1 % (ref 36.0–46.0)
Hemoglobin: 13.6 g/dL (ref 12.0–15.0)
Lymphocytes Relative: 45.6 % (ref 12.0–46.0)
Lymphs Abs: 3.4 10*3/uL (ref 0.7–4.0)
MCHC: 33 g/dL (ref 30.0–36.0)
MCV: 91.8 fl (ref 78.0–100.0)
Monocytes Absolute: 0.4 10*3/uL (ref 0.1–1.0)
Monocytes Relative: 5.7 % (ref 3.0–12.0)
Neutro Abs: 3.5 10*3/uL (ref 1.4–7.7)
Neutrophils Relative %: 46.3 % (ref 43.0–77.0)
Platelets: 230 10*3/uL (ref 150.0–400.0)
RBC: 4.47 Mil/uL (ref 3.87–5.11)
RDW: 14.1 % (ref 11.5–15.5)
WBC: 7.5 10*3/uL (ref 4.0–10.5)

## 2023-06-14 LAB — B12 AND FOLATE PANEL
Folate: 14.8 ng/mL (ref >5.9)
Vitamin B-12: 246 pg/mL (ref 211–911)

## 2023-06-14 LAB — COMPREHENSIVE METABOLIC PANEL
ALT: 9 U/L (ref 0–35)
AST: 13 U/L (ref 0–37)
Albumin: 4.1 g/dL (ref 3.5–5.2)
Alkaline Phosphatase: 42 U/L (ref 39–117)
BUN: 18 mg/dL (ref 6–23)
CO2: 25 mEq/L (ref 19–32)
Calcium: 9 mg/dL (ref 8.4–10.5)
Chloride: 105 mEq/L (ref 96–112)
Creatinine, Ser: 0.69 mg/dL (ref 0.40–1.20)
GFR: 113.95 mL/min (ref >60.00)
Glucose, Bld: 89 mg/dL (ref 70–99)
Potassium: 4.4 mEq/L (ref 3.5–5.1)
Sodium: 138 mEq/L (ref 135–145)
Total Bilirubin: 1.5 mg/dL — ABNORMAL HIGH (ref 0.2–1.2)
Total Protein: 6.7 g/dL (ref 6.0–8.3)

## 2023-06-14 LAB — HIGH SENSITIVITY CRP: CRP, High Sensitivity: 2.58 mg/L (ref 0.000–5.000)

## 2023-06-14 LAB — IBC + FERRITIN
Ferritin: 15.6 ng/mL (ref 10.0–291.0)
Iron: 138 ug/dL (ref 42–145)
Saturation Ratios: 34.2 % (ref 20.0–50.0)
TIBC: 403.2 ug/dL (ref 250.0–450.0)
Transferrin: 288 mg/dL (ref 212.0–360.0)

## 2023-06-14 LAB — VITAMIN D 25 HYDROXY (VIT D DEFICIENCY, FRACTURES): VITD: 32.73 ng/mL (ref 30.00–100.00)

## 2023-06-15 ENCOUNTER — Telehealth: Payer: Self-pay

## 2023-06-15 NOTE — Telephone Encounter (Deleted)
DOD Patient of Dr Lavon Paganini with

## 2023-06-16 LAB — QUANTIFERON-TB GOLD PLUS
Mitogen-NIL: >10 IU/mL
NIL: 0.04 IU/mL
QuantiFERON-TB Gold Plus: NEGATIVE
TB1-NIL: 0.02 IU/mL
TB2-NIL: <0 IU/mL

## 2023-06-28 ENCOUNTER — Other Ambulatory Visit: Payer: Self-pay

## 2023-06-28 DIAGNOSIS — R7989 Other specified abnormal findings of blood chemistry: Secondary | ICD-10-CM

## 2023-06-28 DIAGNOSIS — K51918 Ulcerative colitis, unspecified with other complication: Secondary | ICD-10-CM

## 2023-06-28 DIAGNOSIS — R17 Unspecified jaundice: Secondary | ICD-10-CM

## 2023-06-28 DIAGNOSIS — Z79899 Other long term (current) drug therapy: Secondary | ICD-10-CM

## 2023-07-11 NOTE — Telephone Encounter (Signed)
Opened in error

## 2023-07-18 NOTE — Progress Notes (Signed)
Gloria Green    [id number]    Jan 14, 1991  Primary Care Physician:Mitchell, L.August Saucer, MD  Referring Physician: Clovis Riley, Elbert Ewings.August Saucer, MD [address] [place],  [place] [address]   Chief complaint:  Chief Complaint  Patient presents with   Ulcerative Colitis    Patient states she is here for follow up after starting sterlara. She has some side effects patient states she is on her menstrual cycle 2 1/2 weeks out of a month and menstrual cycles are random. Patient is also having trouble sleeping.   HPI: 32 year old very pleasant female here for follow-up visit for ulcerative colitis and IBS.   She was last seen on 07-11-22.   Today, she reports that she's on stelara and she denies having any GI related side effects.  However, she is experiencing breakthrough menstrual cycles since starting Stelara. Her menstrual cycles were normal prior to starting it however; now she is having them randomly throughout the month. She also states that she hasn't taken her B12 supplements while she was on vacation but she will restart it.    She is also experiencing sleep disturbance. She states that she always had sleep problems but they have worsened since starting Stelara. She is unable to fall asleep till 3 am in the morning. She didn't experience these symptoms when she was on Humira. She states that she typically has 1-2 cups of coffee in the morning. She states that even without drinking coffee, she's not able to fall asleep as well. She also states that she usually sleeps with the TV on and would workout in the evening. She has tried melatonin without any help. She has tried Ambien before but dislikes it's side effects. She has also tried Tramadol which has seemed   Patient denies diarrhea, constipation, nausea, blood in stool, black stool, vomiting, abdominal pain, bloating, unintentional weight loss, reflux, dysphagia.   GI Hx: Colonoscopy 04-13-23 - Pancolitis ulcerative  colitis with UCEIS total score of 2 (mild disease), improved since last examination. Biopsied.  - Non-bleeding internal hemorrhoids. 1. Surgical [P], colon, ascending and cecum - COLONIC MUCOSA WITH MILD ACTIVITY (CRYPTITIS), LAMINA PROPRIA LYMPHOPLASMACYTOSIS AND MINIMAL ARCHITECTURAL DISARRAY. 2. Surgical [P], colon, transverse - COLONIC MUCOSA WITH MILD REACTIVE/REPARATIVE CHANGE AND FOCAL MINIMAL ARCHITECTURAL DISARRAY. 3. Surgical [P], colon, sigmoid and descending - COLONIC MUCOSA WITH MILD REACTIVE/REPARATIVE CHANGE AND FOCAL MINIMAL ARCHITECTURAL DISARRAY. 4. Surgical [P], colon, rectum - COLORECTAL MUCOSA WITH MINIMAL ARCHITECTURAL DISARRAY.  Flexible sigmoidoscopy January 14, 2022 - Severe inflammation from anus to the extent of the exam (mid sigmoid colon), characterized by ulceration, pseudopolyps, very friable, congested mucosa. This was biopsied for histology and also for viral culture. - Small internal hemorrhoids.   EGD 01/14/22 - No fresh or old blood in the UGI tract. - Retained solid food in the stomach without anatomic outlet obstruction, suggesting a component of slow gastric emptying.   CT chest abdomen and pelvis with contrast January 13, 2022 1. Diffuse colonic wall thickening consistent with ulcerative colitis. 2. Borderline splenomegaly. 3. 4 mm left upper lobe pulmonary nodule. No follow-up needed if patient is low-risk. Non-contrast chest CT can be considered in 12 months if patient is high-risk. This recommendation follows the consensus statement: Guidelines for Management of Incidental Pulmonary Nodules Detected on CT Images: From the Fleischner Society 2017; Radiology 2017; 284:228-243. 4. Nonobstructing less than 3 mm right renal calculi. 5. Stable right adrenal adenoma.  Current Outpatient Medications:    norethindrone (MICRONOR) 0.35 MG tablet, Take 1 tablet by mouth daily., Disp: , Rfl:    Ustekinumab (STELARA IV), Inject into the  vein., Disp: , Rfl:    ustekinumab (STELARA) 90 MG/ML SOSY injection, Inject 1 mL (90 mg total) into the skin every 8 (eight) weeks., Disp: 1 mL, Rfl: 6    Allergies as of 07/19/2023 - Review Complete 07/19/2023  Allergen Reaction Noted   Sulfa antibiotics Hives 04/02/2013   Latex Rash 06/09/2019   Wound dressing adhesive Rash 10/16/2021    Past Medical History:  Diagnosis Date   Allergy    SEASONAL   Anal fissure    Anemia    Anxiety    DVT (deep venous thrombosis) (HCC)    AUGUST 2023   Hemorrhoid    Ulcerative colitis (HCC)     Past Surgical History:  Procedure Laterality Date   BIOPSY  01/14/2022   Procedure: BIOPSY;  Surgeon: Rachael Fee, MD;  Location: [facility];  Service: Endoscopy;;   CESAREAN SECTION  09/11/2021   COLONOSCOPY     COSMETIC SURGERY  12/18/2010   nose   ESOPHAGOGASTRODUODENOSCOPY (EGD) WITH PROPOFOL N/A 01/14/2022   Procedure: ESOPHAGOGASTRODUODENOSCOPY (EGD) WITH PROPOFOL;  Surgeon: Rachael Fee, MD;  Location: [facility];  Service: Endoscopy;  Laterality: N/A;   FLEXIBLE SIGMOIDOSCOPY N/A 01/14/2022   Procedure: FLEXIBLE SIGMOIDOSCOPY;  Surgeon: Rachael Fee, MD;  Location: [facility];  Service: Endoscopy;  Laterality: N/A;   MYOMECTOMY N/A 06/16/2019   Procedure: ABDOMINAL MYOMECTOMY;  Surgeon: Richarda Overlie, MD;  Location: [facility];  Service: Gynecology;  Laterality: N/A;   WISDOM TOOTH EXTRACTION      Family History  Problem Relation Age of Onset   Hypertension Mother    Colitis Mother    Obesity Father    Hypertension Brother    Rectal cancer Paternal Aunt    Lung cancer Maternal Grandmother    Lung cancer Maternal Grandfather    Colon cancer Neg Hx    Esophageal cancer Neg Hx    Stomach cancer Neg Hx    Colon polyps Neg Hx    Crohn's disease Neg Hx    Ulcerative colitis Neg Hx     Social History   Socioeconomic History   Marital status: Married    Spouse name: Not on file   Number of  children: 2   Years of education: Not on file   Highest education level: Not on file  Occupational History   Occupation: Teacher  Tobacco Use   Smoking status: Former    Current packs/day: 0.00    Types: Cigarettes    Quit date: 06/11/2017    Years since quitting: 6.1   Smokeless tobacco: Never  Vaping Use   Vaping status: Former   Quit date: 10/11/2018  Substance and Sexual Activity   Alcohol use: Yes    Comment: occ   Drug use: Never   Sexual activity: Yes  Other Topics Concern   Not on file  Social History Narrative   Not on file   Social Determinants of Health   Financial Resource Strain: Low Risk  (09/11/2021)   Received from [facility], [facility]   Overall Financial Resource Strain (CARDIA)    Difficulty of Paying Living Expenses: Not hard at all  Food Insecurity: No Food Insecurity (03/08/2022)   Received from [facility], [facility]   Hunger Vital Sign    Worried About Running Out of Food in  the Last Year: Never true    Ran Out of Food in the Last Year: Never true  Transportation Needs: No Transportation Needs (08/08/2021)   Received from [facility], [facility]   [facility] - Transportation    Lack of Transportation (Medical): No    Lack of Transportation (Non-Medical): No  Physical Activity: Insufficiently Active (08/08/2021)   Received from [facility], [facility]   Exercise Vital Sign    Days of Exercise per Week: 1 day    Minutes of Exercise per Session: 20 min  Stress: No Stress Concern Present (09/11/2021)   Received from [facility], [facility] - Occupational Stress Questionnaire    Feeling of Stress : Not at all  Social Connections: Unknown (04/17/2022)   Received from [facility], [facility]   Social Network    Social Network: Not on file  Intimate Partner Violence: Unknown (03/23/2022)   Received from [facility], [facility]   HITS    Physically Hurt: Not on file    Insult  or Talk Down To: Not on file    Threaten Physical Harm: Not on file    Scream or Curse: Not on file    Review of systems: Review of Systems  Constitutional:  Negative for unexpected weight change.  HENT:  Negative for trouble swallowing.   Gastrointestinal:  Negative for abdominal distention, abdominal pain, anal bleeding, blood in stool, constipation, diarrhea, nausea, rectal pain and vomiting.  Genitourinary:  Positive for menstrual problem.  Psychiatric/Behavioral:  Positive for sleep disturbance.     Physical Exam: There were no vitals filed for this visit.  Body mass index is 21.56 kg/m. General: well-appearing   Eyes: sclera anicteric, no redness ENT: oral mucosa moist without lesions, no cervical or supraclavicular lymphadenopathy CV: RRR, no JVD, no peripheral edema Resp: clear to auscultation bilaterally, normal RR and effort noted GI: soft, no tenderness, with active bowel sounds. No guarding or palpable organomegaly noted. Skin; warm and dry, no rash or jaundice noted Neuro: awake, alert and oriented x 3. Normal gross motor function and fluent speech   Data Reviewed:  Reviewed labs, radiology imaging, old records and pertinent past GI work up  Assessment and Plan/Recommendations:  32 year old very pleasant female with severe ulcerative pancolitis, hospitalization February 2023 with acute flare Loss of clinical response to Humira, switch to Stelara earlier this year Ulcerative colitis symptoms overall improving on Stelara.      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 The patient agreed with the plan and demonstrated an understanding of the instructions.    I,Safa M Kadhim,acting as a scribe for Marsa Aris, MD.,have documented all relevant documentation on the behalf of Marsa Aris, MD,as directed by  Marsa Aris, MD while in the presence of Marsa Aris, MD.   I, Marsa Aris, MD, have reviewed all documentation for this visit. The documentation on 07/19/23 for the exam, diagnosis, procedures, and orders are all accurate and complete.   Iona Beard , MD  CC: Mitchell, L.August Saucer, MD

## 2023-07-19 ENCOUNTER — Encounter: Payer: Self-pay | Admitting: Gastroenterology

## 2023-07-19 ENCOUNTER — Other Ambulatory Visit: Payer: Self-pay | Admitting: *Deleted

## 2023-07-19 ENCOUNTER — Ambulatory Visit (INDEPENDENT_AMBULATORY_CARE_PROVIDER_SITE_OTHER): Payer: BC Managed Care – PPO | Admitting: Gastroenterology

## 2023-07-19 ENCOUNTER — Other Ambulatory Visit (INDEPENDENT_AMBULATORY_CARE_PROVIDER_SITE_OTHER): Payer: BC Managed Care – PPO

## 2023-07-19 ENCOUNTER — Telehealth: Payer: Self-pay | Admitting: *Deleted

## 2023-07-19 VITALS — BP 100/74 | HR 87 | Ht 61.5 in | Wt 116.0 lb

## 2023-07-19 DIAGNOSIS — K51918 Ulcerative colitis, unspecified with other complication: Secondary | ICD-10-CM

## 2023-07-19 DIAGNOSIS — R7989 Other specified abnormal findings of blood chemistry: Secondary | ICD-10-CM

## 2023-07-19 DIAGNOSIS — Z8719 Personal history of other diseases of the digestive system: Secondary | ICD-10-CM

## 2023-07-19 DIAGNOSIS — G4700 Insomnia, unspecified: Secondary | ICD-10-CM | POA: Diagnosis not present

## 2023-07-19 DIAGNOSIS — R17 Unspecified jaundice: Secondary | ICD-10-CM

## 2023-07-19 DIAGNOSIS — R5383 Other fatigue: Secondary | ICD-10-CM

## 2023-07-19 DIAGNOSIS — D508 Other iron deficiency anemias: Secondary | ICD-10-CM

## 2023-07-19 LAB — CBC WITH DIFFERENTIAL/PLATELET
Basophils Absolute: 0 10*3/uL (ref 0.0–0.1)
Basophils Relative: 0.5 % (ref 0.0–3.0)
Eosinophils Absolute: 0.1 10*3/uL (ref 0.0–0.7)
Eosinophils Relative: 1.6 % (ref 0.0–5.0)
HCT: 45 % (ref 36.0–46.0)
Hemoglobin: 15 g/dL (ref 12.0–15.0)
Lymphocytes Relative: 40.9 % (ref 12.0–46.0)
Lymphs Abs: 3.6 10*3/uL (ref 0.7–4.0)
MCHC: 33.3 g/dL (ref 30.0–36.0)
MCV: 93.3 fl (ref 78.0–100.0)
Monocytes Absolute: 0.5 10*3/uL (ref 0.1–1.0)
Monocytes Relative: 6 % (ref 3.0–12.0)
Neutro Abs: 4.5 10*3/uL (ref 1.4–7.7)
Neutrophils Relative %: 51 % (ref 43.0–77.0)
Platelets: 253 10*3/uL (ref 150.0–400.0)
RBC: 4.82 Mil/uL (ref 3.87–5.11)
RDW: 13.5 % (ref 11.5–15.5)
WBC: 8.7 10*3/uL (ref 4.0–10.5)

## 2023-07-19 LAB — COMPREHENSIVE METABOLIC PANEL
ALT: 11 U/L (ref 0–35)
AST: 14 U/L (ref 0–37)
Albumin: 4.6 g/dL (ref 3.5–5.2)
Alkaline Phosphatase: 50 U/L (ref 39–117)
BUN: 19 mg/dL (ref 6–23)
CO2: 24 mEq/L (ref 19–32)
Calcium: 9.6 mg/dL (ref 8.4–10.5)
Chloride: 102 mEq/L (ref 96–112)
Creatinine, Ser: 0.64 mg/dL (ref 0.40–1.20)
GFR: 115.96 mL/min (ref >60.00)
Glucose, Bld: 82 mg/dL (ref 70–99)
Potassium: 5.1 mEq/L (ref 3.5–5.1)
Sodium: 135 mEq/L (ref 135–145)
Total Bilirubin: 2.2 mg/dL — ABNORMAL HIGH (ref 0.2–1.2)
Total Protein: 7.9 g/dL (ref 6.0–8.3)

## 2023-07-19 LAB — HIGH SENSITIVITY CRP: CRP, High Sensitivity: 2.73 mg/L (ref 0.000–5.000)

## 2023-07-19 MED ORDER — TRAMADOL HCL 50 MG PO TABS: 50.0000 mg | ORAL_TABLET | Freq: Every day | ORAL | 5 refills | Status: DC

## 2023-07-19 NOTE — Telephone Encounter (Signed)
MyChart message sent to the patient informing her that she can come in tomorrow to have her labs drawn, also informed hours of operation for the lab located in the basement, as well as the address at [address].

## 2023-07-19 NOTE — Patient Instructions (Signed)
Your provider has requested that you go to the basement level for lab work before leaving today. Press "B" on the elevator. The lab is located at the first door on the left as you exit the elevator.   We have sent the following medications to your pharmacy for you to pick up at your convenience: Tremadol  Follow up in 6 months  Due to recent changes in healthcare laws, you may see the results of your imaging and laboratory studies on MyChart before your provider has had a chance to review them.  We understand that in some cases there may be results that are confusing or concerning to you. Not all laboratory results come back in the same time frame and the provider may be waiting for multiple results in order to interpret others.  Please give [place] 48 hours in order for your provider to thoroughly review all the results before contacting the office for clarification of your results.    I appreciate the  opportunity to care for you  Thank You   Marsa Aris , MD

## 2023-07-20 ENCOUNTER — Encounter: Payer: Self-pay | Admitting: Gastroenterology

## 2023-07-20 ENCOUNTER — Other Ambulatory Visit (INDEPENDENT_AMBULATORY_CARE_PROVIDER_SITE_OTHER): Payer: BC Managed Care – PPO

## 2023-07-20 DIAGNOSIS — R17 Unspecified jaundice: Secondary | ICD-10-CM

## 2023-07-20 DIAGNOSIS — K51918 Ulcerative colitis, unspecified with other complication: Secondary | ICD-10-CM | POA: Diagnosis not present

## 2023-07-20 DIAGNOSIS — D508 Other iron deficiency anemias: Secondary | ICD-10-CM

## 2023-07-20 DIAGNOSIS — R7989 Other specified abnormal findings of blood chemistry: Secondary | ICD-10-CM | POA: Diagnosis not present

## 2023-07-20 DIAGNOSIS — R5383 Other fatigue: Secondary | ICD-10-CM

## 2023-07-20 LAB — GAMMA GT: GGT: 11 U/L (ref 7–51)

## 2023-07-20 LAB — BILIRUBIN, TOTAL: Total Bilirubin: 1.4 mg/dL — ABNORMAL HIGH (ref 0.2–1.2)

## 2023-07-20 MED ORDER — TRAZODONE HCL 50 MG PO TABS: 50.0000 mg | ORAL_TABLET | Freq: Every day | ORAL | 3 refills | Status: AC

## 2023-07-20 NOTE — Telephone Encounter (Signed)
Spoke with the patient about her still continuing with her RUQ Ultrasound. She also questioned her prescription of Trazodone for Insomnia.. On her Rx [facility] it says Tramadol qty 5...Marland KitchenMarland KitchenMarland Kitchen I can not find anywhere in our documentation that we ever sent her Tramadol. We do however show the prescription as Trazodone one at bedtime daily  qty 90.. I told her it must be an error through her pharmacy. She is going to contact them and let me know if there is anything we need to do on our end.

## 2023-07-20 NOTE — Telephone Encounter (Signed)
Inbound call from patient in regards to results. Please advise.  Thank you

## 2023-07-20 NOTE — Telephone Encounter (Signed)
Tramadol was sent in error, she needs to pick up Trazodone. Tramadol is for pain and is controlled substance, pharmacy cannot dispense more than 5 days supply

## 2023-07-20 NOTE — Telephone Encounter (Signed)
Called and spoke with Tiffany at the pharmacy. Tiffany informed me that patient picked up Tramadol RX yesterday. Patient is stating that she only received 5 pills, although written for 30 tabs, 5 refills. Trazadone has been filled, but not picked up by patient. Please advise on which RX is correct. Thanks

## 2023-07-20 NOTE — Telephone Encounter (Signed)
I have spoken to the patient about her lab results, she is still scheduled for the Ultrasound and wants to know does she still need to keep that appointment.   Dr Lavon Paganini patient wants to know does she still need to keep her Ultrasound appointment

## 2023-07-20 NOTE — Telephone Encounter (Signed)
Yes, I want to make sure she does not have any gallstones or gallbladder disease. Please advise her to go ahead with RUQ ultrasound. Thanks

## 2023-07-27 ENCOUNTER — Ambulatory Visit (HOSPITAL_COMMUNITY)
Admission: RE | Admit: 2023-07-27 | Discharge: 2023-07-27 | Disposition: A | Discharge status: Home / Self Care | Payer: BC Managed Care – PPO | Source: Ambulatory Visit | Attending: Gastroenterology | Admitting: Gastroenterology

## 2023-07-27 DIAGNOSIS — R17 Unspecified jaundice: Secondary | ICD-10-CM | POA: Diagnosis present

## 2023-07-27 DIAGNOSIS — R7989 Other specified abnormal findings of blood chemistry: Secondary | ICD-10-CM | POA: Diagnosis present

## 2023-07-27 DIAGNOSIS — Z8719 Personal history of other diseases of the digestive system: Secondary | ICD-10-CM | POA: Diagnosis present

## 2023-12-17 ENCOUNTER — Encounter: Payer: Self-pay | Admitting: Gastroenterology

## 2024-02-21 ENCOUNTER — Other Ambulatory Visit: Payer: Self-pay

## 2024-02-21 ENCOUNTER — Telehealth: Payer: Self-pay | Admitting: Gastroenterology

## 2024-02-21 MED ORDER — STELARA 90 MG/ML ~~LOC~~ SOSY: 90.0000 mg | PREFILLED_SYRINGE | SUBCUTANEOUS | Status: DC

## 2024-02-21 NOTE — Telephone Encounter (Signed)
 New prescription sent to [facility]. Letter to the patient to schedule 6 month check with Dr Lavon Paganini or PA.

## 2024-02-21 NOTE — Telephone Encounter (Signed)
 [facility] is calling to get a prescription for Stelara. Please advise.

## 2024-03-14 ENCOUNTER — Telehealth: Payer: Self-pay

## 2024-03-14 NOTE — Telephone Encounter (Signed)
 [facility] approved Stelara 03/13/24-3/37/2026. Form sent to be scanned.

## 2024-03-31 ENCOUNTER — Other Ambulatory Visit: Payer: Self-pay | Admitting: Gastroenterology

## 2024-03-31 NOTE — Telephone Encounter (Signed)
 Beth are you handling this patients Stelara? When you click on the script its a lot of questions on there.

## 2024-06-10 ENCOUNTER — Other Ambulatory Visit (INDEPENDENT_AMBULATORY_CARE_PROVIDER_SITE_OTHER)

## 2024-06-10 ENCOUNTER — Encounter: Payer: Self-pay | Admitting: Gastroenterology

## 2024-06-10 ENCOUNTER — Ambulatory Visit (INDEPENDENT_AMBULATORY_CARE_PROVIDER_SITE_OTHER): Admitting: Gastroenterology

## 2024-06-10 VITALS — BP 96/60 | HR 72 | Ht 62.0 in | Wt 122.4 lb

## 2024-06-10 DIAGNOSIS — K5100 Ulcerative (chronic) pancolitis without complications: Secondary | ICD-10-CM

## 2024-06-10 LAB — CBC WITH DIFFERENTIAL/PLATELET
Basophils Absolute: 0 10*3/uL (ref 0.0–0.1)
Basophils Relative: 0.4 % (ref 0.0–3.0)
Eosinophils Absolute: 0.1 10*3/uL (ref 0.0–0.7)
Eosinophils Relative: 1.2 % (ref 0.0–5.0)
HCT: 39.7 % (ref 36.0–46.0)
Hemoglobin: 13.2 g/dL (ref 12.0–15.0)
Lymphocytes Relative: 39.4 % (ref 12.0–46.0)
Lymphs Abs: 2.7 10*3/uL (ref 0.7–4.0)
MCHC: 33.3 g/dL (ref 30.0–36.0)
MCV: 93.6 fl (ref 78.0–100.0)
Monocytes Absolute: 0.4 10*3/uL (ref 0.1–1.0)
Monocytes Relative: 6.4 % (ref 3.0–12.0)
Neutro Abs: 3.6 10*3/uL (ref 1.4–7.7)
Neutrophils Relative %: 52.6 % (ref 43.0–77.0)
Platelets: 208 10*3/uL (ref 150.0–400.0)
RBC: 4.24 Mil/uL (ref 3.87–5.11)
RDW: 13.4 % (ref 11.5–15.5)
WBC: 6.9 10*3/uL (ref 4.0–10.5)

## 2024-06-10 LAB — IBC + FERRITIN
Ferritin: 13.1 ng/mL (ref 10.0–291.0)
Iron: 158 ug/dL — ABNORMAL HIGH (ref 42–145)
Saturation Ratios: 42.6 % (ref 20.0–50.0)
TIBC: 371 ug/dL (ref 250.0–450.0)
Transferrin: 265 mg/dL (ref 212.0–360.0)

## 2024-06-10 LAB — COMPREHENSIVE METABOLIC PANEL WITH GFR
ALT: 10 U/L (ref 0–35)
AST: 12 U/L (ref 0–37)
Albumin: 4 g/dL (ref 3.5–5.2)
Alkaline Phosphatase: 37 U/L — ABNORMAL LOW (ref 39–117)
BUN: 16 mg/dL (ref 6–23)
CO2: 29 meq/L (ref 19–32)
Calcium: 8.6 mg/dL (ref 8.4–10.5)
Chloride: 104 meq/L (ref 96–112)
Creatinine, Ser: 0.67 mg/dL (ref 0.40–1.20)
GFR: 113.97 mL/min (ref >60.00)
Glucose, Bld: 61 mg/dL — ABNORMAL LOW (ref 70–99)
Potassium: 4.5 meq/L (ref 3.5–5.1)
Sodium: 137 meq/L (ref 135–145)
Total Bilirubin: 0.8 mg/dL (ref 0.2–1.2)
Total Protein: 6.4 g/dL (ref 6.0–8.3)

## 2024-06-10 LAB — VITAMIN D 25 HYDROXY (VIT D DEFICIENCY, FRACTURES): VITD: 28.48 ng/mL — ABNORMAL LOW (ref 30.00–100.00)

## 2024-06-10 LAB — B12 AND FOLATE PANEL
Folate: 11.7 ng/mL (ref >5.9)
Vitamin B-12: 333 pg/mL (ref 211–911)

## 2024-06-10 LAB — HIGH SENSITIVITY CRP: CRP, High Sensitivity: 1.82 mg/L (ref 0.000–5.000)

## 2024-06-10 NOTE — Patient Instructions (Addendum)
 VISIT SUMMARY:  Today, we reviewed your inflammatory bowel disease (IBD) management. Your condition is well-controlled, and you have not experienced significant issues recently. We discussed your occasional mild abdominal pain and constipation, which you manage effectively. We also talked about adjusting your medication schedule for your upcoming vacation.  YOUR PLAN:  -INFLAMMATORY BOWEL DISEASE (IBD): IBD is a chronic condition that causes inflammation in your digestive tract. Your IBD is well-controlled with no recent episodes of constipation or bleeding. You occasionally experience mild abdominal pain due to gas, which resolves on its own. Your hydration is good, and your bowel movements are regular. We will order an annual fecal calprotectin test and annual labs to monitor your condition. These labs include a complete blood count, comprehensive metabolic panel, C-reactive protein, iron level, vitamin D , B12, folic acid , and a TB test. We will also check your hepatitis B core antibody due to your immunosuppressive therapy. Your next colonoscopy is scheduled in 5 years if your inflammatory markers remain normal. You can administer your medication a week early to accommodate your vacation schedule.  INSTRUCTIONS:  Please complete the annual fecal calprotectin test and the annual labs, including CBC, CMP, CRP, iron level, vitamin D , B12, folic acid , and TB test. We will also check your hepatitis B core antibody. Your next colonoscopy is scheduled in 5 years if your inflammatory markers remain normal. Administer your medication a week early due to your vacation schedule.  Due to recent changes in healthcare laws, you may see the results of your imaging and laboratory studies on MyChart before your provider has had a chance to review them.  We understand that in some cases there may be results that are confusing or concerning to you. Not all laboratory results come back in the same time frame and the  provider may be waiting for multiple results in order to interpret others.  Please give us  48 hours in order for your provider to thoroughly review all the results before contacting the office for clarification of your results.    I appreciate the  opportunity to care for you  Thank You   Kavitha Nandigam , MD

## 2024-06-10 NOTE — Progress Notes (Signed)
 Gloria Green    [id number]    1991-11-17  Primary Care Physician:Mitchell, L.Addie, MD (Inactive)  Referring Physician: No referring provider defined for this encounter.   Chief complaint: Ulcerative colitis  Discussed the use of AI scribe software for clinical note transcription with the patient, who gave verbal consent to proceed.  History of Present Illness Gloria Green is a 33 year old female with inflammatory bowel disease who presents for routine follow-up.  She has not experienced significant issues with her inflammatory bowel disease recently. She occasionally experiences constipation, which she manages effectively with Miralax and increased water intake. She drinks coffee in the morning and consumes about three to four large jugs of water daily, including during the night. Constipation has not been a problem for about two years.  She occasionally experiences mild abdominal pain, attributed to gas, which resolves after passing gas. This pain occurs infrequently, about every other month, and lasts less than a minute. She does not take any medication for this as it is not frequent or severe.  She is on immunosuppressive therapy for her inflammatory bowel disease and adheres to her medication schedule. She discussed adjusting her medication schedule to accommodate an upcoming vacation. Previously, she experienced frequent bowel movements, but now reports going once or twice a day, which she considers normal. No bleeding, pain, or rash is reported. She has not had a recent colonoscopy.  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 Biopsied.  - Non-bleeding internal hemorrhoids. 1. Surgical [P], colon, ascending and cecum - COLONIC MUCOSA  WITH MILD ACTIVITY (CRYPTITIS), LAMINA PROPRIA LYMPHOPLASMACYTOSIS AND MINIMAL ARCHITECTURAL DISARRAY. 2. Surgical [P], colon, transverse - COLONIC MUCOSA WITH MILD REACTIVE/REPARATIVE CHANGE AND FOCAL MINIMAL ARCHITECTURAL DISARRAY. 3. Surgical [P], colon, sigmoid and descending - COLONIC MUCOSA WITH MILD REACTIVE/REPARATIVE CHANGE AND FOCAL MINIMAL ARCHITECTURAL DISARRAY. 4. Surgical [P], colon, rectum - COLORECTAL MUCOSA WITH MINIMAL ARCHITECTURAL DISARRAY.   Flexible sigmoidoscopy January 14, 2022 - Severe inflammation from anus to the extent of the exam (mid sigmoid colon), characterized by ulceration, pseudopolyps, very friable, congested mucosa. This was biopsied for histology and also for viral culture. - Small internal hemorrhoids.   EGD 01/14/22 - No fresh or old blood in the UGI tract. - Retained solid food in the stomach without anatomic outlet obstruction, suggesting a component of slow gastric emptying.   CT chest abdomen and pelvis with contrast January 13, 2022 1. Diffuse colonic wall thickening consistent with ulcerative colitis. 2. Borderline splenomegaly. 3. 4 mm left upper lobe pulmonary nodule. No follow-up needed if patient is low-risk. Non-contrast chest CT can be considered in 12 months if patient is high-risk. This recommendation follows the consensus statement: Guidelines for Management of Incidental Pulmonary Nodules Detected on CT Images: From the Fleischner Society 2017; Radiology 2017; 284:228-243. 4. Nonobstructing less than 3 mm right renal calculi. 5. Stable right adrenal adenoma.   Outpatient Encounter Medications as of 06/10/2024  Medication Sig   FLUoxetine (PROZAC) 20 MG capsule Take 20 mg by mouth daily.   norethindrone (MICRONOR) 0.35 MG tablet Take 1 tablet by mouth daily.   RETIN-A 0.05 % cream Apply 45 g topically at bedtime.   STELARA  90 MG/ML SOSY injection INJECT 1 SYRINGE UNDER  THE SKIN EVERY 8 WEEKS   traZODone  (DESYREL ) 50 MG  tablet Take 1 tablet (50 mg total) by mouth at bedtime. (Patient taking differently: Take 200 mg by mouth at bedtime.)   eszopiclone (LUNESTA) 1 MG TABS tablet Take 1 mg by mouth at bedtime. (Patient not taking: Reported on 06/10/2024)   Ustekinumab  (STELARA  IV) Inject into the vein. (Patient not taking: Reported on 06/10/2024)   No facility-administered encounter medications on file as of 06/10/2024.    Allergies as of 06/10/2024 - Review Complete 06/10/2024  Allergen Reaction Noted   Sulfa antibiotics Hives 04/02/2013   Latex Rash 06/09/2019   Wound dressing adhesive Rash 10/16/2021    Past Medical History:  Diagnosis Date   Allergy    SEASONAL   Anal fissure    Anemia    Anxiety    DVT (deep venous thrombosis) (HCC)    AUGUST 2023   Hemorrhoid    Ulcerative colitis [facility])     Past Surgical History:  Procedure Laterality Date   BIOPSY  01/14/2022   Procedure: BIOPSY;  Surgeon: Teressa Toribio SQUIBB, MD;  Location: [facility];  Service: Endoscopy;;   CESAREAN SECTION  09/11/2021   COLONOSCOPY     COSMETIC SURGERY  12/18/2010   nose   ESOPHAGOGASTRODUODENOSCOPY (EGD) WITH PROPOFOL  N/A 01/14/2022   Procedure: ESOPHAGOGASTRODUODENOSCOPY (EGD) WITH PROPOFOL ;  Surgeon: Teressa Toribio SQUIBB, MD;  Location: [facility];  Service: Endoscopy;  Laterality: N/A;   FLEXIBLE SIGMOIDOSCOPY N/A 01/14/2022   Procedure: FLEXIBLE SIGMOIDOSCOPY;  Surgeon: Teressa Toribio SQUIBB, MD;  Location: [facility];  Service: Endoscopy;  Laterality: N/A;   MYOMECTOMY N/A 06/16/2019   Procedure: ABDOMINAL MYOMECTOMY;  Surgeon: Johnnye Ade, MD;  Location[facility];  Service: Gynecology;  Laterality: N/A;   WISDOM TOOTH EXTRACTION      Family History  Problem Relation Age of Onset   Hypertension Mother    Colitis Mother    Obesity Father    Hypertension Brother    Rectal cancer Paternal Aunt    Lung cancer Maternal Grandmother    Lung cancer Maternal Grandfather    Colon cancer Neg Hx     Esophageal cancer Neg Hx    Stomach cancer Neg Hx    Colon polyps Neg Hx    Crohn's disease Neg Hx    Ulcerative colitis Neg Hx     Social History   Socioeconomic History   Marital status: Married    Spouse name: Not on file   Number of children: 2   Years of education: Not on file   Highest education level: Not on file  Occupational History   Occupation: Teacher  Tobacco Use   Smoking status: Former    Current packs/day: 0.00    Types: Cigarettes    Quit date: 06/11/2017    Years since quitting: 7.0   Smokeless tobacco: Never  Vaping Use   Vaping status: Former   Quit date: 10/11/2018  Substance and Sexual Activity   Alcohol use: Yes    Comment: occ   Drug use: Never   Sexual activity: Yes  Other Topics Concern   Not on file  Social History Narrative   Not on file   Social Drivers of Health   Financial Resource Strain: Low Risk  (03/18/2024)   Received from [facility]   Overall Financial Resource Strain (CARDIA)    Difficulty of Paying Living Expenses: Not hard at all  Food Insecurity: No Food Insecurity (03/18/2024)   Received from [facility]  Hunger Vital Sign    Within the past 12 months, you worried that your food would run out before you got the money to buy more.: Never true    Within the past 12 months, the food you bought just didn't last and you didn't have money to get more.: Never true  Transportation Needs: No Transportation Needs (03/18/2024)   Received from [facility]   PRAPARE - Transportation    Lack of Transportation (Medical): No    Lack of Transportation (Non-Medical): No  Physical Activity: Insufficiently Active (03/18/2024)   Received from [facility]   Exercise Vital Sign    On average, how many days per week do you engage in moderate to strenuous exercise (like a brisk walk)?: 3 days    On average, how many minutes do you engage in exercise at this level?: 30 min  Stress: Stress Concern Present (03/18/2024)   Received from [facility] - Occupational Stress Questionnaire    Feeling of Stress : To some extent  Social Connections: Moderately Integrated (03/18/2024)   Received from [facility]   Social Network    How would you rate your social network (family, work, friends)?: Adequate participation with social networks  Intimate Partner Violence: Not At Risk (03/18/2024)   Received from [facility]   HITS    Over the last 12 months how often did your partner physically hurt you?: Never    Over the last 12 months how often did your partner insult you or talk down to you?: Never    Over the last 12 months how often did your partner threaten you with physical harm?: Never    Over the last 12 months how often did your partner scream or curse at you?: Never      Review of systems: All other review of systems negative except as mentioned in the HPI.   Physical Exam: Vitals:   06/10/24 0824  BP: 96/60  Pulse: 72   Body mass index is 22.38 kg/m. Gen:      No acute distress HEENT:  sclera anicteric Abd:      soft, non-tender; no palpable masses, no distension Ext:    No edema Neuro: alert and oriented x 3 Psych: normal mood and affect  Data Reviewed:  Reviewed labs, radiology imaging, old records and pertinent past GI work up     Assessment and Plan Assessment & Plan 33 year old very pleasant female with severe ulcerative pancolitis, hospitalization February 2023 with acute flare Loss of clinical response to Humira , switched to Stelara  2024 Ulcerative colitis symptoms overall better on Stelara .  In clinical remission    IBD is well-controlled with no recent episodes of diarrhea or bleeding. Occasional mild abdominal pain, likely due to gas, resolves spontaneously and is infrequent. Hydration is adequate, and bowel movements are regular. No need for antispasmodics due to the infrequency and short duration of pain. Colonoscopy indicates disease control, allowing  for a 5-year interval before the next procedure. Discussed early medication administration due to upcoming vacation, with potential for slightly higher initial drug levels and shorter duration for the subsequent dose. Insurance is expected to accommodate early refill requests. - Order annual fecal calprotectin test. - Order annual labs for IBD maintenance: CBC, CMP, CRP, iron level, vitamin D , B12, folic acid , and TB test. - Check hepatitis B core antibody due to immunosuppression. - Schedule colonoscopy in 5 years if inflammatory markers remain normal.  Return for follow-up visit in  9 to 12 months   The patient was provided an opportunity to ask questions and all were answered. The patient agreed with the plan and demonstrated an understanding of the instructions.  LOIS Wilkie Mcgee , MD    CC: No ref. provider found

## 2024-06-12 ENCOUNTER — Ambulatory Visit: Payer: Self-pay | Admitting: Gastroenterology

## 2024-06-12 ENCOUNTER — Telehealth: Payer: Self-pay | Admitting: Gastroenterology

## 2024-06-12 LAB — QUANTIFERON-TB GOLD PLUS: QuantiFERON-TB Gold Plus: NEGATIVE

## 2024-06-12 NOTE — Telephone Encounter (Signed)
 Documentation completed in result note. Closing this encounter.

## 2024-06-12 NOTE — Telephone Encounter (Signed)
 Patient returning call to discuss results. Please advise  Thank you

## 2024-06-13 ENCOUNTER — Other Ambulatory Visit

## 2024-06-13 DIAGNOSIS — K5100 Ulcerative (chronic) pancolitis without complications: Secondary | ICD-10-CM

## 2024-06-13 LAB — QUANTIFERON-TB GOLD PLUS
Mitogen-NIL: >10 [IU]/mL
NIL: 0.02 [IU]/mL
TB1-NIL: 0.01 [IU]/mL
TB2-NIL: 0.03 [IU]/mL

## 2024-06-13 LAB — HEPATITIS B CORE ANTIBODY, TOTAL

## 2024-06-17 LAB — CALPROTECTIN, FECAL: Calprotectin, Fecal: 20 ug/g (ref 0–120)

## 2024-06-18 ENCOUNTER — Other Ambulatory Visit

## 2024-06-18 DIAGNOSIS — K5100 Ulcerative (chronic) pancolitis without complications: Secondary | ICD-10-CM

## 2024-06-19 LAB — HEPATITIS B CORE ANTIBODY, TOTAL: Hep B Core Total Ab: NONREACTIVE

## 2024-07-05 ENCOUNTER — Other Ambulatory Visit: Payer: Self-pay | Admitting: Gastroenterology

## 2024-07-07 NOTE — Telephone Encounter (Signed)
**Note De-identified  [place] Obfuscation** Please advise 

## 2025-01-05 ENCOUNTER — Encounter: Payer: Self-pay | Admitting: Gastroenterology

## 2025-01-08 ENCOUNTER — Other Ambulatory Visit (HOSPITAL_COMMUNITY): Payer: Self-pay

## 2025-01-09 ENCOUNTER — Other Ambulatory Visit (HOSPITAL_COMMUNITY): Payer: Self-pay

## 2025-01-10 ENCOUNTER — Other Ambulatory Visit: Payer: Self-pay

## 2025-01-10 ENCOUNTER — Encounter (HOSPITAL_COMMUNITY): Payer: Self-pay

## 2025-01-10 ENCOUNTER — Emergency Department (HOSPITAL_COMMUNITY)
Admission: EM | Admit: 2025-01-10 | Discharge: 2025-01-10 | Disposition: A | Discharge status: Home / Self Care | Attending: Emergency Medicine | Admitting: Emergency Medicine

## 2025-01-10 ENCOUNTER — Emergency Department (HOSPITAL_COMMUNITY)

## 2025-01-10 DIAGNOSIS — Z7901 Long term (current) use of anticoagulants: Secondary | ICD-10-CM | POA: Diagnosis not present

## 2025-01-10 DIAGNOSIS — N200 Calculus of kidney: Secondary | ICD-10-CM | POA: Diagnosis not present

## 2025-01-10 DIAGNOSIS — R1084 Generalized abdominal pain: Secondary | ICD-10-CM | POA: Diagnosis present

## 2025-01-10 DIAGNOSIS — R197 Diarrhea, unspecified: Secondary | ICD-10-CM | POA: Insufficient documentation

## 2025-01-10 DIAGNOSIS — Z91040 Latex allergy status: Secondary | ICD-10-CM | POA: Diagnosis not present

## 2025-01-10 LAB — RESP PANEL BY RT-PCR (RSV, FLU A&B, COVID)  RVPGX2
Influenza A by PCR: NEGATIVE
Influenza B by PCR: NEGATIVE
Resp Syncytial Virus by PCR: NEGATIVE
SARS Coronavirus 2 by RT PCR: NEGATIVE

## 2025-01-10 LAB — COMPREHENSIVE METABOLIC PANEL WITH GFR
ALT: 8 U/L (ref 0–44)
AST: 16 U/L (ref 15–41)
Albumin: 4.3 g/dL (ref 3.5–5.0)
Alkaline Phosphatase: 53 U/L (ref 38–126)
Anion gap: 9 (ref 5–15)
BUN: 20 mg/dL (ref 6–20)
CO2: 23 mmol/L (ref 22–32)
Calcium: 9.3 mg/dL (ref 8.9–10.3)
Chloride: 106 mmol/L (ref 98–111)
Creatinine, Ser: 0.79 mg/dL (ref 0.44–1.00)
GFR, Estimated: >60 mL/min
Glucose, Bld: 110 mg/dL — ABNORMAL HIGH (ref 70–99)
Potassium: 4.2 mmol/L (ref 3.5–5.1)
Sodium: 137 mmol/L (ref 135–145)
Total Bilirubin: 1.2 mg/dL (ref 0.0–1.2)
Total Protein: 7.4 g/dL (ref 6.5–8.1)

## 2025-01-10 LAB — CBC
HCT: 48.1 % — ABNORMAL HIGH (ref 36.0–46.0)
Hemoglobin: 15.7 g/dL — ABNORMAL HIGH (ref 12.0–15.0)
MCH: 31.4 pg (ref 26.0–34.0)
MCHC: 32.6 g/dL (ref 30.0–36.0)
MCV: 96.2 fL (ref 80.0–100.0)
Platelets: 266 10*3/uL (ref 150–400)
RBC: 5 MIL/uL (ref 3.87–5.11)
RDW: 11.9 % (ref 11.5–15.5)
WBC: 9.6 10*3/uL (ref 4.0–10.5)
nRBC: 0 % (ref 0.0–0.2)

## 2025-01-10 LAB — URINALYSIS, ROUTINE W REFLEX MICROSCOPIC
Bacteria, UA: NONE SEEN
Bilirubin Urine: NEGATIVE
Glucose, UA: NEGATIVE mg/dL
Ketones, ur: NEGATIVE mg/dL
Leukocytes,Ua: NEGATIVE
Nitrite: NEGATIVE
Protein, ur: 100 mg/dL — AB
RBC / HPF: >50 RBC/hpf (ref 0–5)
Specific Gravity, Urine: 1.031 — ABNORMAL HIGH (ref 1.005–1.030)
WBC, UA: >50 WBC/hpf (ref 0–5)
pH: 5 (ref 5.0–8.0)

## 2025-01-10 LAB — HCG, SERUM, QUALITATIVE: Preg, Serum: NEGATIVE

## 2025-01-10 LAB — LIPASE, BLOOD: Lipase: 30 U/L (ref 11–51)

## 2025-01-10 MED ORDER — MORPHINE SULFATE (PF) 4 MG/ML IV SOLN
4.0000 mg | Freq: Once | INTRAVENOUS | Status: AC
  Administered 2025-01-10: 4 mg via INTRAVENOUS
  Filled 2025-01-10: qty 1

## 2025-01-10 MED ORDER — ONDANSETRON HCL 4 MG/2ML IJ SOLN
4.0000 mg | Freq: Once | INTRAMUSCULAR | Status: AC
  Administered 2025-01-10: 4 mg via INTRAVENOUS
  Filled 2025-01-10: qty 2

## 2025-01-10 MED ORDER — OXYCODONE-ACETAMINOPHEN 5-325 MG PO TABS: 1.0000 | ORAL_TABLET | Freq: Four times a day (QID) | ORAL | 0 refills | Status: AC | PRN

## 2025-01-10 MED ORDER — IOHEXOL 300 MG/ML  SOLN
100.0000 mL | Freq: Once | INTRAMUSCULAR | Status: AC | PRN
  Administered 2025-01-10: 100 mL via INTRAVENOUS

## 2025-01-10 MED ORDER — SODIUM CHLORIDE 0.9 % IV BOLUS
1000.0000 mL | Freq: Once | INTRAVENOUS | Status: AC
  Administered 2025-01-10: 1000 mL via INTRAVENOUS

## 2025-01-10 MED ORDER — ONDANSETRON HCL 4 MG PO TABS: 4.0000 mg | ORAL_TABLET | Freq: Four times a day (QID) | ORAL | 0 refills | Status: AC

## 2025-01-10 MED ORDER — KETOROLAC TROMETHAMINE 15 MG/ML IJ SOLN
15.0000 mg | Freq: Once | INTRAMUSCULAR | Status: AC
  Administered 2025-01-10: 15 mg via INTRAVENOUS
  Filled 2025-01-10: qty 1

## 2025-01-10 NOTE — ED Provider Triage Note (Signed)
 Emergency Medicine Provider Triage Evaluation Note  Gloria Green , a 34 y.o. female  was evaluated in triage.  Pt complains of diarrhea, hematuria, abdominal cramping only with diarrhea, also nausea and vomiting. No fevers. History of UC, on Stelara  and has done well but insurance now denying coverage pending PA with patient's GI. Secure chat to her GI on 01/05/25 but has not heard back.   Review of Systems  Positive:  Negative:   Physical Exam  BP 124/80 (BP Location: Right Arm)   Pulse 96   Temp 98.2 F (36.8 C) (Oral)   Resp 17   LMP 12/30/2024 (Exact Date)   SpO2 100%  Gen:   Awake, no distress   Resp:  Normal effort  MSK:   Moves extremities without difficulty  Other:  Abdomen soft and non tender   Medical Decision Making  Medically screening exam initiated at 5:46 AM.  Appropriate orders placed.  [facility] was informed that the remainder of the evaluation will be completed by another provider, this initial triage assessment does not replace that evaluation, and the importance of remaining in the ED until their evaluation is complete.     Beverley Leita LABOR, PA-C 01/10/25 [phone]

## 2025-01-10 NOTE — Discharge Instructions (Addendum)
 You have been diagnosed with a 8 x 6 mm kidney stone in your left kidney.  However this is not obstructing and may pass on their own.  You may experience episodes of pain as the stone moves.  Drink enough fluids and reduce salt intake.  Take Tylenol  as needed for pain.  Can take pain medication for severe pain.  May use Zofran  for nausea.  Follow-up with urology by calling their office for further evaluation.  Also follow-up with your GI for evaluation of UC.  Return for worsening fevers, severe pain, vomiting, no urine output, shortness of breath, chest pain.

## 2025-01-10 NOTE — ED Triage Notes (Signed)
 Pt woke up yesterday with nausea, vomiting and diarrhea. Pt also complains of abdominal cramping related to diarrhea. Pt also noticed hematuria yesterday with no pain and/or difficulty urinating. Pt has ulcerative colitis but unable to get medications due to insurance issues.

## 2025-01-10 NOTE — ED Notes (Signed)
 PO challenge completed. Pt has no complaints.

## 2025-01-11 LAB — URINE CULTURE: Culture: <10000 — AB

## 2025-01-13 NOTE — Telephone Encounter (Signed)
 I have spoken with patient to advise that insurance now prefers yesintek over stelara . Advised that Dr Shila requests she be seen in the office to further discuss. 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 She has instead scheduled with Delon Failing, PA-C on 2/2 at 230 pm.

## 2025-01-19 ENCOUNTER — Ambulatory Visit: Admitting: Physician Assistant
